# Patient Record
Sex: Male | Born: 1945 | Race: White | Hispanic: No | Marital: Married | Smoking: Never smoker
Health system: Southern US, Community
[De-identification: ages and names within clinical notes are randomized; demographics above are authoritative.]

## PROBLEM LIST (undated history)

## (undated) DIAGNOSIS — R112 Nausea with vomiting, unspecified: Secondary | ICD-10-CM

## (undated) DIAGNOSIS — Z9889 Other specified postprocedural states: Secondary | ICD-10-CM

## (undated) DIAGNOSIS — K831 Obstruction of bile duct: Secondary | ICD-10-CM

## (undated) DIAGNOSIS — F419 Anxiety disorder, unspecified: Secondary | ICD-10-CM

## (undated) DIAGNOSIS — M1990 Unspecified osteoarthritis, unspecified site: Secondary | ICD-10-CM

## (undated) DIAGNOSIS — K8689 Other specified diseases of pancreas: Secondary | ICD-10-CM

## (undated) DIAGNOSIS — C801 Malignant (primary) neoplasm, unspecified: Secondary | ICD-10-CM

## (undated) HISTORY — PX: TONSILLECTOMY: SUR1361

## (undated) HISTORY — PX: JOINT REPLACEMENT: SHX530

---

## 2012-05-12 ENCOUNTER — Encounter (HOSPITAL_COMMUNITY): Payer: Self-pay | Admitting: Pharmacy Technician

## 2012-05-15 NOTE — Progress Notes (Signed)
Need orders placed in EP[facility] for surgery on 05/25/12.  Preop appointment on 05/19/12 at 1000am.

## 2012-05-17 NOTE — H&P (Signed)
TOTAL KNEE ADMISSION H&P  Patient is being admitted for right total knee arthroplasty.  Subjective:  Chief Complaint:right knee pain.  HPI: Ryan Roach, 67 y.o. male, has a history of pain and functional disability in the right knee due to arthritis and has failed non-surgical conservative treatments for greater than 12 weeks to includeNSAID's and/or analgesics, weight reduction as appropriate and activity modification.  Onset of symptoms was gradual, starting >10 years ago with gradually worsening course since that time. The patient noted no past surgery on the right knee(s).  Patient currently rates pain in the right knee(s) at 7 out of 10 with activity. Patient has night pain, worsening of pain with activity and weight bearing, pain that interferes with activities of daily living, pain with passive range of motion, crepitus and joint swelling.  Patient has evidence of periarticular osteophytes, joint subluxation and joint space narrowing by imaging studies. There is no active infection.  Past Medical History Osteoarthritis Morbid Obesity  Past Surgical History  Tonsillectomy   Current outpatient prescriptions: ibuprofen (ADVIL,MOTRIN) 200 MG tablet, Take 400-600 mg by mouth every 6 (six) hours as needed for pain., Disp: , Rfl:   No Known Allergies   History  Substance Use Topics  . Smoking status: Nonsmoker  . Smokeless tobacco: Yes  . Alcohol Use: No   Family History Father deceased age 20 due to MVA Mother deceased age 32 due to breast cancer  Review of Systems  Constitutional: Negative.   HENT: Negative.  Negative for neck pain.   Eyes: Negative.   Respiratory: Negative.   Cardiovascular: Negative.   Gastrointestinal: Negative.   Genitourinary: Negative.   Musculoskeletal: Positive for joint pain. Negative for myalgias, back pain and falls.       Bilateral knee pain  Skin: Negative.   Neurological: Negative.   Endo/Heme/Allergies: Negative.    Psychiatric/Behavioral: Negative.     Objective:  Physical Exam  Constitutional: He is oriented to person, place, and time. He appears well-developed. No distress.  HENT:  Head: Normocephalic and atraumatic.  Right Ear: External ear normal.  Left Ear: External ear normal.  Nose: Nose normal.  Mouth/Throat: Oropharynx is clear and moist.  Eyes: Conjunctivae and EOM are normal.  Neck: Normal range of motion. Neck supple. No tracheal deviation present. No thyromegaly present.  Cardiovascular: Normal rate, normal heart sounds and intact distal pulses.   No murmur heard. Respiratory: Effort normal and breath sounds normal. No respiratory distress. He has no wheezes. He exhibits no tenderness.  GI: Soft. Bowel sounds are normal. He exhibits no distension and no mass. There is no tenderness.  Musculoskeletal:       Right hip: Normal.       Left hip: Normal.       Right knee: He exhibits decreased range of motion, swelling and abnormal alignment. He exhibits no effusion and no erythema. Tenderness found. Medial joint line and lateral joint line tenderness noted.       Left knee: He exhibits decreased range of motion, swelling and abnormal alignment. He exhibits no effusion and no erythema. Tenderness found. Medial joint line and lateral joint line tenderness noted.       Right lower leg: He exhibits no tenderness and no swelling.       Left lower leg: He exhibits no tenderness and no swelling.  5 to 90 degrees bilaterally   Lymphadenopathy:    He has no cervical adenopathy.  Neurological: He is alert and oriented to person, place, and time. He  has normal strength. No sensory deficit.  Skin: No rash noted. He is not diaphoretic. No erythema.  Psychiatric: He has a normal mood and affect. His behavior is normal.    Vitals Weight: 317 lb Height: 71 in Body Surface Area: 2.68 m Body Mass Index: 44.21 kg/m BP: 150/80 (Sitting, Left Arm, Standard)  HR: 84 bpm  Imaging  Review Plain radiographs demonstrate severe degenerative joint disease of the right knee(s). The overall alignment issignificant varus. The bone quality appears to be fair for age and reported activity level.  Assessment/Plan:  End stage arthritis, right knee   The patient history, physical examination, clinical judgment of the provider and imaging studies are consistent with end stage degenerative joint disease of the right knee(s) and total knee arthroplasty is deemed medically necessary. The treatment options including medical management, injection therapy arthroscopy and arthroplasty were discussed at length. The risks and benefits of total knee arthroplasty were presented and reviewed. The risks due to aseptic loosening, infection, stiffness, patella tracking problems, thromboembolic complications and other imponderables were discussed. The patient acknowledged the explanation, agreed to proceed with the plan and consent was signed. Patient is being admitted for inpatient treatment for surgery, pain control, PT, OT, prophylactic antibiotics, VTE prophylaxis, progressive ambulation and ADL's and discharge planning. The patient is planning to be discharged home with home health services     [place], [place]

## 2012-05-18 ENCOUNTER — Inpatient Hospital Stay (HOSPITAL_COMMUNITY): Admission: RE | Admit: 2012-05-18 | Payer: Self-pay | Source: Ambulatory Visit

## 2012-05-19 ENCOUNTER — Encounter (HOSPITAL_COMMUNITY): Payer: Self-pay

## 2012-05-19 ENCOUNTER — Encounter (HOSPITAL_COMMUNITY)
Admission: RE | Admit: 2012-05-19 | Discharge: 2012-05-19 | Disposition: A | Discharge status: Home / Self Care | Payer: Managed Care, Other (non HMO) | Source: Ambulatory Visit | Attending: Orthopedic Surgery | Admitting: Orthopedic Surgery

## 2012-05-19 ENCOUNTER — Ambulatory Visit (HOSPITAL_COMMUNITY)
Admission: RE | Admit: 2012-05-19 | Discharge: 2012-05-19 | Disposition: A | Discharge status: Home / Self Care | Payer: Managed Care, Other (non HMO) | Source: Ambulatory Visit | Attending: Surgical | Admitting: Surgical

## 2012-05-19 DIAGNOSIS — I517 Cardiomegaly: Secondary | ICD-10-CM | POA: Insufficient documentation

## 2012-05-19 DIAGNOSIS — Z01812 Encounter for preprocedural laboratory examination: Secondary | ICD-10-CM | POA: Insufficient documentation

## 2012-05-19 DIAGNOSIS — M1710 Unilateral primary osteoarthritis, unspecified knee: Secondary | ICD-10-CM | POA: Insufficient documentation

## 2012-05-19 DIAGNOSIS — Z01811 Encounter for preprocedural respiratory examination: Secondary | ICD-10-CM | POA: Insufficient documentation

## 2012-05-19 HISTORY — DX: Unspecified osteoarthritis, unspecified site: M19.90

## 2012-05-19 LAB — COMPREHENSIVE METABOLIC PANEL
ALT: 16 U/L (ref 0–53)
AST: 19 U/L (ref 0–37)
Albumin: 3.6 g/dL (ref 3.5–5.2)
Alkaline Phosphatase: 136 U/L — ABNORMAL HIGH (ref 39–117)
BUN: 21 mg/dL (ref 6–23)
CO2: 26 mEq/L (ref 19–32)
Calcium: 8.9 mg/dL (ref 8.4–10.5)
Chloride: 106 mEq/L (ref 96–112)
Creatinine, Ser: 0.59 mg/dL (ref 0.50–1.35)
GFR calc Af Amer: >90 mL/min (ref >90)
GFR calc non Af Amer: >90 mL/min (ref >90)
Glucose, Bld: 98 mg/dL (ref 70–99)
Potassium: 4 mEq/L (ref 3.5–5.1)
Sodium: 142 mEq/L (ref 135–145)
Total Bilirubin: 0.7 mg/dL (ref 0.3–1.2)
Total Protein: 7.4 g/dL (ref 6.0–8.3)

## 2012-05-19 LAB — CBC
MCH: 30.2 pg (ref 26.0–34.0)
MCV: 90.1 fL (ref 78.0–100.0)
Platelets: 286 10*3/uL (ref 150–400)
RDW: 13.6 % (ref 11.5–15.5)

## 2012-05-19 LAB — URINALYSIS, ROUTINE W REFLEX MICROSCOPIC
Glucose, UA: NEGATIVE mg/dL
Hgb urine dipstick: NEGATIVE
Ketones, ur: NEGATIVE mg/dL
Leukocytes, UA: NEGATIVE
Nitrite: NEGATIVE
Protein, ur: NEGATIVE mg/dL
Specific Gravity, Urine: 1.042 — ABNORMAL HIGH (ref 1.005–1.030)
Urobilinogen, UA: 1 mg/dL (ref 0.0–1.0)
pH: 5.5 (ref 5.0–8.0)

## 2012-05-19 LAB — PROTIME-INR
INR: 1 (ref 0.00–1.49)
Prothrombin Time: 13.1 seconds (ref 11.6–15.2)

## 2012-05-19 LAB — APTT: aPTT: 33 seconds (ref 24–37)

## 2012-05-19 NOTE — Progress Notes (Signed)
05/19/12 1017  OBSTRUCTIVE SLEEP APNEA  Have you ever been diagnosed with sleep apnea through a sleep study? No  Do you snore loudly (loud enough to be heard through closed doors)?  0  Do you often feel tired, fatigued, or sleepy during the daytime? 0  Has anyone observed you stop breathing during your sleep? 0  BMI more than 35 kg/m2? 1  Age over 66 years old? 1  Neck circumference greater than 40 cm/18 inches? 1  Gender: 1  Obstructive Sleep Apnea Score 4  Score 4 or greater  Results sent to PCP

## 2012-05-19 NOTE — Patient Instructions (Addendum)
Ryan Roach  05/19/2012                           YOUR PROCEDURE IS SCHEDULED ON: 05/25/12               PLEASE REPORT TO SHORT STAY CENTER AT :  12:00 PM               CALL THIS NUMBER IF ANY PROBLEMS THE DAY OF SURGERY :               [phone]                      REMEMBER:   Do not eat food or drink liquids AFTER MIDNIGHT  May have clear liquids UNTIL 6 HOURS BEFORE SURGERY (8:30 AM)  Clear liquids include soda, tea, black coffee, apple or grape juice, broth.  Take these medicines the morning of surgery with A SIP OF WATER:  TYLENOL IF NEEDED   Do not wear jewelry, make-up   Do not wear lotions, powders, or perfumes.   Do not shave legs or underarms 12 hrs. before surgery (men may shave face)  Do not bring valuables to the hospital.  Contacts, dentures or bridgework may not be worn into surgery.  Leave suitcase in the car. After surgery it may be brought to your room.  For patients admitted to the hospital more than one night, checkout time is 11:00                          The day of discharge.   Patients discharged the day of surgery will not be allowed to drive home                             If going home same day of surgery, must have someone stay with you first                           24 hrs at home and arrange for some one to drive you home from hospital.    Special Instructions:   Please read over the following fact sheets that you were given:               1. MRSA  INFORMATION                      2. [facility] PREPARING FOR SURGERY SHEET               3. INCENTIVE SPIROMETER                                                X_____________________________________________________________________        Failure to follow these instructions may result in cancellation of your surgery

## 2012-05-19 NOTE — Progress Notes (Signed)
Abnormal UA and CMET faxed to Dr. Darrelyn Hillock thru [facility]

## 2012-05-25 ENCOUNTER — Encounter (HOSPITAL_COMMUNITY): Payer: Self-pay | Admitting: *Deleted

## 2012-05-25 ENCOUNTER — Encounter (HOSPITAL_COMMUNITY)
Admission: RE | Disposition: A | Discharge status: Home Health | Payer: Self-pay | Source: Ambulatory Visit | Attending: Orthopedic Surgery

## 2012-05-25 ENCOUNTER — Encounter (HOSPITAL_COMMUNITY): Payer: Self-pay | Admitting: Anesthesiology

## 2012-05-25 ENCOUNTER — Inpatient Hospital Stay (HOSPITAL_COMMUNITY)
Admission: RE | Admit: 2012-05-25 | Discharge: 2012-05-28 | DRG: 470 | Disposition: A | Discharge status: Home Health | Payer: Managed Care, Other (non HMO) | Source: Ambulatory Visit | Attending: Orthopedic Surgery | Admitting: Orthopedic Surgery

## 2012-05-25 ENCOUNTER — Inpatient Hospital Stay (HOSPITAL_COMMUNITY): Payer: Managed Care, Other (non HMO)

## 2012-05-25 ENCOUNTER — Inpatient Hospital Stay (HOSPITAL_COMMUNITY): Payer: Managed Care, Other (non HMO) | Admitting: Anesthesiology

## 2012-05-25 DIAGNOSIS — M24569 Contracture, unspecified knee: Secondary | ICD-10-CM | POA: Diagnosis present

## 2012-05-25 DIAGNOSIS — M1710 Unilateral primary osteoarthritis, unspecified knee: Principal | ICD-10-CM | POA: Diagnosis present

## 2012-05-25 DIAGNOSIS — Z6841 Body Mass Index (BMI) 40.0 and over, adult: Secondary | ICD-10-CM

## 2012-05-25 DIAGNOSIS — M1711 Unilateral primary osteoarthritis, right knee: Secondary | ICD-10-CM | POA: Diagnosis present

## 2012-05-25 DIAGNOSIS — Z96651 Presence of right artificial knee joint: Secondary | ICD-10-CM

## 2012-05-25 DIAGNOSIS — D62 Acute posthemorrhagic anemia: Secondary | ICD-10-CM | POA: Diagnosis not present

## 2012-05-25 HISTORY — PX: TOTAL KNEE ARTHROPLASTY: SHX125

## 2012-05-25 LAB — TYPE AND SCREEN
ABO/RH(D): A NEG
Antibody Screen: NEGATIVE

## 2012-05-25 SURGERY — ARTHROPLASTY, KNEE, TOTAL
Anesthesia: General | Site: Knee | Laterality: Right | Wound class: Clean

## 2012-05-25 MED ORDER — SODIUM CHLORIDE 0.9 % IR SOLN
Status: DC | PRN
  Administered 2012-05-25: 1000 mL

## 2012-05-25 MED ORDER — METHOCARBAMOL 500 MG PO TABS
500.0000 mg | ORAL_TABLET | Freq: Four times a day (QID) | ORAL | Status: DC | PRN
  Administered 2012-05-25: 500 mg via ORAL
  Filled 2012-05-25: qty 1

## 2012-05-25 MED ORDER — ACETAMINOPHEN 325 MG PO TABS: 650.0000 mg | ORAL_TABLET | Freq: Four times a day (QID) | ORAL | Status: DC | PRN

## 2012-05-25 MED ORDER — ONDANSETRON HCL 4 MG/2ML IJ SOLN
INTRAMUSCULAR | Status: DC | PRN
  Administered 2012-05-25: 4 mg via INTRAVENOUS

## 2012-05-25 MED ORDER — PROPOFOL 10 MG/ML IV BOLUS
INTRAVENOUS | Status: DC | PRN
  Administered 2012-05-25: 250 mg via INTRAVENOUS

## 2012-05-25 MED ORDER — LACTATED RINGERS IV BOLUS (SEPSIS)
250.0000 mL | INTRAVENOUS | Status: AC
  Administered 2012-05-25: 18:00:00 via INTRAVENOUS
  Administered 2012-05-25: 250 mL via INTRAVENOUS

## 2012-05-25 MED ORDER — POLYETHYLENE GLYCOL 3350 17 G PO PACK
17.0000 g | PACK | Freq: Every day | ORAL | Status: DC | PRN
  Filled 2012-05-25: qty 1

## 2012-05-25 MED ORDER — HYDROMORPHONE HCL PF 1 MG/ML IJ SOLN: 0.2500 mg | INTRAMUSCULAR | Status: DC | PRN

## 2012-05-25 MED ORDER — DEXTROSE 5 % IV SOLN
3.0000 g | INTRAVENOUS | Status: AC
  Administered 2012-05-25: 3 g via INTRAVENOUS
  Filled 2012-05-25: qty 3000

## 2012-05-25 MED ORDER — HYDROMORPHONE HCL PF 1 MG/ML IJ SOLN
INTRAMUSCULAR | Status: DC | PRN
  Administered 2012-05-25: .4 mg via INTRAVENOUS
  Administered 2012-05-25: 1 mg via INTRAVENOUS
  Administered 2012-05-25: .4 mg via INTRAVENOUS
  Administered 2012-05-25: .8 mg via INTRAVENOUS
  Administered 2012-05-25: 1 mg via INTRAVENOUS
  Administered 2012-05-25: .4 mg via INTRAVENOUS

## 2012-05-25 MED ORDER — FLEET ENEMA 7-19 GM/118ML RE ENEM: 1.0000 | ENEMA | Freq: Once | RECTAL | Status: AC | PRN

## 2012-05-25 MED ORDER — CELECOXIB 200 MG PO CAPS
200.0000 mg | ORAL_CAPSULE | Freq: Two times a day (BID) | ORAL | Status: DC
  Administered 2012-05-25 – 2012-05-28 (×6): 200 mg via ORAL
  Filled 2012-05-25 (×8): qty 1

## 2012-05-25 MED ORDER — LACTATED RINGERS IV SOLN
INTRAVENOUS | Status: DC
Start: 2012-05-25 — End: 2012-05-28
  Administered 2012-05-25 – 2012-05-27 (×6): via INTRAVENOUS

## 2012-05-25 MED ORDER — MIDAZOLAM HCL 5 MG/5ML IJ SOLN
INTRAMUSCULAR | Status: DC | PRN
  Administered 2012-05-25: 2 mg via INTRAVENOUS

## 2012-05-25 MED ORDER — LIDOCAINE HCL (CARDIAC) 20 MG/ML IV SOLN
INTRAVENOUS | Status: DC | PRN
  Administered 2012-05-25: 80 mg via INTRAVENOUS

## 2012-05-25 MED ORDER — CEFAZOLIN SODIUM 1-5 GM-% IV SOLN
1.0000 g | Freq: Four times a day (QID) | INTRAVENOUS | Status: AC
  Administered 2012-05-25 – 2012-05-26 (×2): 1 g via INTRAVENOUS
  Filled 2012-05-25 (×3): qty 50

## 2012-05-25 MED ORDER — MENTHOL 3 MG MT LOZG
1.0000 | LOZENGE | OROMUCOSAL | Status: DC | PRN
  Filled 2012-05-25: qty 9

## 2012-05-25 MED ORDER — PHENOL 1.4 % MT LIQD: 1.0000 | OROMUCOSAL | Status: DC | PRN

## 2012-05-25 MED ORDER — SUCCINYLCHOLINE CHLORIDE 20 MG/ML IJ SOLN
INTRAMUSCULAR | Status: DC | PRN
  Administered 2012-05-25: 160 mg via INTRAVENOUS

## 2012-05-25 MED ORDER — ALUM & MAG HYDROXIDE-SIMETH 200-200-20 MG/5ML PO SUSP: 30.0000 mL | ORAL | Status: DC | PRN

## 2012-05-25 MED ORDER — METHOCARBAMOL 500 MG PO TABS: 500.0000 mg | ORAL_TABLET | Freq: Four times a day (QID) | ORAL | Status: DC | PRN

## 2012-05-25 MED ORDER — OXYCODONE-ACETAMINOPHEN 5-325 MG PO TABS: 1.0000 | ORAL_TABLET | ORAL | Status: DC | PRN

## 2012-05-25 MED ORDER — OXYCODONE-ACETAMINOPHEN 5-325 MG PO TABS
2.0000 | ORAL_TABLET | ORAL | Status: DC | PRN
  Administered 2012-05-25 – 2012-05-26 (×5): 2 via ORAL
  Filled 2012-05-25: qty 2
  Filled 2012-05-25: qty 1
  Filled 2012-05-25 (×5): qty 2

## 2012-05-25 MED ORDER — FERROUS SULFATE 325 (65 FE) MG PO TABS
325.0000 mg | ORAL_TABLET | Freq: Three times a day (TID) | ORAL | Status: DC
  Administered 2012-05-26 – 2012-05-28 (×7): 325 mg via ORAL
  Filled 2012-05-25 (×10): qty 1

## 2012-05-25 MED ORDER — ROCURONIUM BROMIDE 100 MG/10ML IV SOLN
INTRAVENOUS | Status: DC | PRN
  Administered 2012-05-25: 10 mg via INTRAVENOUS
  Administered 2012-05-25 (×2): 20 mg via INTRAVENOUS

## 2012-05-25 MED ORDER — THROMBIN 5000 UNITS EX SOLR
CUTANEOUS | Status: DC | PRN
  Administered 2012-05-25: 5000 [IU] via TOPICAL

## 2012-05-25 MED ORDER — RIVAROXABAN 10 MG PO TABS: 10.0000 mg | ORAL_TABLET | Freq: Every day | ORAL | Status: DC

## 2012-05-25 MED ORDER — BUPIVACAINE LIPOSOME 1.3 % IJ SUSP
20.0000 mL | Freq: Once | INTRAMUSCULAR | Status: DC
  Filled 2012-05-25: qty 20

## 2012-05-25 MED ORDER — HYDROMORPHONE HCL PF 1 MG/ML IJ SOLN: 1.0000 mg | INTRAMUSCULAR | Status: DC | PRN

## 2012-05-25 MED ORDER — RIVAROXABAN 10 MG PO TABS
10.0000 mg | ORAL_TABLET | Freq: Every day | ORAL | Status: DC
  Administered 2012-05-26 – 2012-05-28 (×3): 10 mg via ORAL
  Filled 2012-05-25 (×4): qty 1

## 2012-05-25 MED ORDER — BISACODYL 10 MG RE SUPP: 10.0000 mg | Freq: Every day | RECTAL | Status: DC | PRN

## 2012-05-25 MED ORDER — LABETALOL HCL 5 MG/ML IV SOLN
2.5000 mg | Freq: Once | INTRAVENOUS | Status: AC
  Administered 2012-05-25: 2.5 mg via INTRAVENOUS

## 2012-05-25 MED ORDER — FENTANYL CITRATE 0.05 MG/ML IJ SOLN
INTRAMUSCULAR | Status: DC | PRN
  Administered 2012-05-25: 50 ug via INTRAVENOUS
  Administered 2012-05-25: 100 ug via INTRAVENOUS
  Administered 2012-05-25 (×2): 50 ug via INTRAVENOUS
  Administered 2012-05-25: 100 ug via INTRAVENOUS

## 2012-05-25 MED ORDER — CHLORHEXIDINE GLUCONATE 4 % EX LIQD: 60.0000 mL | Freq: Once | CUTANEOUS | Status: DC

## 2012-05-25 MED ORDER — POLYETHYLENE GLYCOL 3350 17 G PO PACK: 17.0000 g | PACK | Freq: Every day | ORAL | Status: DC | PRN

## 2012-05-25 MED ORDER — GLYCOPYRROLATE 0.2 MG/ML IJ SOLN
INTRAMUSCULAR | Status: DC | PRN
  Administered 2012-05-25: 0.4 mg via INTRAVENOUS

## 2012-05-25 MED ORDER — SODIUM CHLORIDE 0.9 % IJ SOLN
INTRAMUSCULAR | Status: DC | PRN
  Administered 2012-05-25 (×2)

## 2012-05-25 MED ORDER — LACTATED RINGERS IV SOLN
INTRAVENOUS | Status: DC
  Administered 2012-05-25: 18:00:00 via INTRAVENOUS
  Administered 2012-05-25: 1000 mL via INTRAVENOUS

## 2012-05-25 MED ORDER — PROMETHAZINE HCL 25 MG/ML IJ SOLN
6.2500 mg | INTRAMUSCULAR | Status: DC | PRN
  Administered 2012-05-25: 6.25 mg via INTRAVENOUS

## 2012-05-25 MED ORDER — ACETAMINOPHEN 650 MG RE SUPP: 650.0000 mg | Freq: Four times a day (QID) | RECTAL | Status: DC | PRN

## 2012-05-25 MED ORDER — SODIUM CHLORIDE 0.9 % IR SOLN
Status: DC | PRN
  Administered 2012-05-25: 14:00:00

## 2012-05-25 MED ORDER — ONDANSETRON HCL 4 MG/2ML IJ SOLN: 4.0000 mg | Freq: Four times a day (QID) | INTRAMUSCULAR | Status: DC | PRN

## 2012-05-25 MED ORDER — ONDANSETRON HCL 4 MG PO TABS: 4.0000 mg | ORAL_TABLET | Freq: Four times a day (QID) | ORAL | Status: DC | PRN

## 2012-05-25 MED ORDER — ACETAMINOPHEN 10 MG/ML IV SOLN
INTRAVENOUS | Status: DC | PRN
  Administered 2012-05-25: 1000 mg via INTRAVENOUS

## 2012-05-25 MED ORDER — FERROUS SULFATE 325 (65 FE) MG PO TABS: 325.0000 mg | ORAL_TABLET | Freq: Three times a day (TID) | ORAL | Status: DC

## 2012-05-25 MED ORDER — DEXTROSE 5 % IV SOLN: 500.0000 mg | Freq: Four times a day (QID) | INTRAVENOUS | Status: DC | PRN

## 2012-05-25 MED ORDER — LACTATED RINGERS IV SOLN
INTRAVENOUS | Status: DC | PRN
  Administered 2012-05-25 (×2): via INTRAVENOUS

## 2012-05-25 MED ORDER — HYDROCODONE-ACETAMINOPHEN 5-325 MG PO TABS
1.0000 | ORAL_TABLET | ORAL | Status: DC | PRN
  Administered 2012-05-26 – 2012-05-28 (×7): 2 via ORAL
  Filled 2012-05-25 (×7): qty 2

## 2012-05-25 MED ORDER — NEOSTIGMINE METHYLSULFATE 1 MG/ML IJ SOLN
INTRAMUSCULAR | Status: DC | PRN
  Administered 2012-05-25: 2.5 mg via INTRAVENOUS

## 2012-05-25 MED ORDER — SODIUM CHLORIDE 0.9 % IJ SOLN
INTRAMUSCULAR | Status: DC | PRN
  Administered 2012-05-25: 50 mL

## 2012-05-25 SURGICAL SUPPLY — 64 items
BAG ZIPLOCK 12X15 (MISCELLANEOUS) ×2 IMPLANT
BANDAGE ELASTIC 4 VELCRO ST LF (GAUZE/BANDAGES/DRESSINGS) ×2 IMPLANT
BANDAGE ELASTIC 6 VELCRO ST LF (GAUZE/BANDAGES/DRESSINGS) ×2 IMPLANT
BANDAGE ESMARK 6X9 LF (GAUZE/BANDAGES/DRESSINGS) ×1 IMPLANT
BANDAGE GAUZE ELAST BULKY 4 IN (GAUZE/BANDAGES/DRESSINGS) ×2 IMPLANT
BLADE SAG 18X100X1.27 (BLADE) ×2 IMPLANT
BLADE SAW SGTL 11.0X1.19X90.0M (BLADE) ×2 IMPLANT
BNDG ESMARK 6X9 LF (GAUZE/BANDAGES/DRESSINGS) ×2
BONE CEMENT GENTAMICIN (Cement) ×4 IMPLANT
CEMENT BONE GENTAMICIN 40 (Cement) ×2 IMPLANT
CLOTH BEACON ORANGE TIMEOUT ST (SAFETY) ×2 IMPLANT
CLSR STERI-STRIP ANTIMIC 1/2X4 (GAUZE/BANDAGES/DRESSINGS) ×4 IMPLANT
CUFF TOURN SGL QUICK 34 (TOURNIQUET CUFF) ×1
CUFF TRNQT CYL 34X4X40X1 (TOURNIQUET CUFF) ×1 IMPLANT
DRAPE EXTREMITY T 121X128X90 (DRAPE) ×2 IMPLANT
DRAPE INCISE IOBAN 66X45 STRL (DRAPES) ×2 IMPLANT
DRAPE LG THREE QUARTER DISP (DRAPES) ×2 IMPLANT
DRAPE POUCH INSTRU U-SHP 10X18 (DRAPES) ×2 IMPLANT
DRAPE U-SHAPE 47X51 STRL (DRAPES) ×2 IMPLANT
DRSG ADAPTIC 3X8 NADH LF (GAUZE/BANDAGES/DRESSINGS) ×2 IMPLANT
DRSG PAD ABDOMINAL 8X10 ST (GAUZE/BANDAGES/DRESSINGS) ×6 IMPLANT
DURAPREP 26ML APPLICATOR (WOUND CARE) ×2 IMPLANT
ELECT BLADE TIP CTD 4 INCH (ELECTRODE) ×4 IMPLANT
ELECT REM PT RETURN 9FT ADLT (ELECTROSURGICAL) ×2
ELECTRODE REM PT RTRN 9FT ADLT (ELECTROSURGICAL) ×1 IMPLANT
EVACUATOR 1/8 PVC DRAIN (DRAIN) ×2 IMPLANT
FACESHIELD LNG OPTICON STERILE (SAFETY) ×12 IMPLANT
GLOVE BIOGEL PI IND STRL 8 (GLOVE) ×1 IMPLANT
GLOVE BIOGEL PI IND STRL 8.5 (GLOVE) IMPLANT
GLOVE BIOGEL PI INDICATOR 8 (GLOVE) ×1
GLOVE BIOGEL PI INDICATOR 8.5 (GLOVE)
GLOVE ECLIPSE 8.0 STRL XLNG CF (GLOVE) ×4 IMPLANT
GLOVE SURG SS PI 6.5 STRL IVOR (GLOVE) ×4 IMPLANT
GOWN PREVENTION PLUS LG XLONG (DISPOSABLE) ×4 IMPLANT
GOWN STRL REIN XL XLG (GOWN DISPOSABLE) ×2 IMPLANT
HANDPIECE INTERPULSE COAX TIP (DISPOSABLE) ×1
IMMOBILIZER KNEE 20 (SOFTGOODS) ×2
IMMOBILIZER KNEE 20 THIGH 36 (SOFTGOODS) ×1 IMPLANT
KIT BASIN OR (CUSTOM PROCEDURE TRAY) ×2 IMPLANT
MANIFOLD NEPTUNE II (INSTRUMENTS) ×2 IMPLANT
NEEDLE HYPO 22GX1.5 SAFETY (NEEDLE) ×2 IMPLANT
NS IRRIG 1000ML POUR BTL (IV SOLUTION) ×2 IMPLANT
PACK TOTAL JOINT (CUSTOM PROCEDURE TRAY) ×2 IMPLANT
POSITIONER SURGICAL ARM (MISCELLANEOUS) ×2 IMPLANT
SET HNDPC FAN SPRY TIP SCT (DISPOSABLE) ×1 IMPLANT
SET PAD KNEE POSITIONER (MISCELLANEOUS) ×2 IMPLANT
SPONGE GAUZE 4X4 12PLY (GAUZE/BANDAGES/DRESSINGS) ×2 IMPLANT
SPONGE LAP 18X18 X RAY DECT (DISPOSABLE) ×2 IMPLANT
SPONGE SURGIFOAM ABS GEL 100 (HEMOSTASIS) ×2 IMPLANT
STAPLER VISISTAT 35W (STAPLE) IMPLANT
SUCTION FRAZIER 12FR DISP (SUCTIONS) ×2 IMPLANT
SUT BONE WAX W31G (SUTURE) ×2 IMPLANT
SUT MNCRL AB 4-0 PS2 18 (SUTURE) ×2 IMPLANT
SUT VIC AB 1 CT1 27 (SUTURE) ×2
SUT VIC AB 1 CT1 27XBRD ANTBC (SUTURE) ×2 IMPLANT
SUT VIC AB 2-0 CT1 27 (SUTURE) ×2
SUT VIC AB 2-0 CT1 TAPERPNT 27 (SUTURE) ×2 IMPLANT
SUT VLOC 180 0 24IN GS25 (SUTURE) ×2 IMPLANT
SYR 20CC LL (SYRINGE) ×2 IMPLANT
TOWEL OR 17X26 10 PK STRL BLUE (TOWEL DISPOSABLE) ×4 IMPLANT
TOWER CARTRIDGE SMART MIX (DISPOSABLE) ×2 IMPLANT
TRAY FOLEY CATH 14FRSI W/METER (CATHETERS) ×2 IMPLANT
WATER STERILE IRR 1500ML POUR (IV SOLUTION) ×2 IMPLANT
WRAP KNEE MAXI GEL POST OP (GAUZE/BANDAGES/DRESSINGS) ×4 IMPLANT

## 2012-05-25 NOTE — Anesthesia Postprocedure Evaluation (Addendum)
  Anesthesia Post-op Note  Patient: Ryan Roach  Procedure(s) Performed: Procedure(s) (LRB): RIGHT TOTAL KNEE ARTHROPLASTY (Right)  Patient Location: PACU  Anesthesia Type: General  Level of Consciousness: awake and alert   Airway and Oxygen Therapy: Patient Spontanous Breathing  Post-op Pain: mild  Post-op Assessment: Post-op Vital signs reviewed, Patient's Cardiovascular Status Stable, Respiratory Function Stable, Patent Airway and No signs of Nausea or vomiting  Last Vitals:  Filed Vitals:   05/25/12 1800  BP: 146/82  Pulse: 117  Temp:   Resp: 16    Post-op Vital Signs: stable   Complications: No apparent anesthesia complications. Pretty sedated after two hours in PACU. Plan to transfer to stepdown unit for increased nursing observation in this patient at high risk for sleep apnea. He is tolerating a nasal airway without complaint. Plan IV fluids for increased HR. He has only received 2000 cc crystalloid today. Urine output borderline (130 cc over two hours).

## 2012-05-25 NOTE — Brief Op Note (Signed)
05/25/2012  3:27 PM  PATIENT:  Ryan Roach  67 y.o. male  PRE-OPERATIVE DIAGNOSIS:  Right Knee Osteoarthritis.VERY COMPLEX; Morbid Obesity  POST-OPERATIVE DIAGNOSIS:  Right Knee Osteoarthritis;VERY COMPLEX and Morbid Obesity  PROCEDURE:  Procedure(s): RIGHT TOTAL KNEE ARTHROPLASTY (Right),COMPLEX  SURGEON:  Surgeon(s) and Role:    * Jacki Cones, MD - Primary  PHYSICIAN ASSISTANT: Dimitri Ped PA:   ASSISTANTS: Dimitri Ped PA  ANESTHESIA:   general  EBL:  Total I/O In: 1000 [I.V.:1000] Out: 300 [Urine:300]  BLOOD ADMINISTERED:none  DRAINS: (One) Hemovact drain(s) in the Right Knee. with  Suction Open   LOCAL MEDICATIONS USED:  BUPIVICAINE 20cc mixed with 20cc of Normal Saline.  SPECIMEN:  No Specimen  DISPOSITION OF SPECIMEN:  N/A  COUNTS:  YES  TOURNIQUET:  * Missing tourniquet times found for documented tourniquets in log:  92270 *  DICTATION: .Other Dictation: Dictation Number [id number]  PLAN OF CARE: Admit to inpatient   PATIENT DISPOSITION:  Stable in OR.   Delay start of Pharmacological VTE agent (>24hrs) due to surgical blood loss or risk of bleeding: yes

## 2012-05-25 NOTE — Anesthesia Procedure Notes (Addendum)
Performed by: Thornell Mule   Procedure Name: Intubation Date/Time: 05/25/2012 1:37 PM Performed by: Thornell Mule Pre-anesthesia Checklist: Patient identified, Timeout performed, Suction available, Emergency Drugs available and Patient being monitored Patient Re-evaluated:Patient Re-evaluated prior to inductionOxygen Delivery Method: Circle system utilized Preoxygenation: Pre-oxygenation with 100% oxygen Intubation Type: IV induction Ventilation: Mask ventilation without difficulty Laryngoscope Size: Mac and 4 Grade View: Grade III Tube type: Oral Tube size: 7.5 mm Number of attempts: 2 Airway Equipment and Method: Stylet and Oral airway Placement Confirmation: ETT inserted through vocal cords under direct vision,  positive ETCO2 and breath sounds checked- equal and bilateral Secured at: 23 cm Tube secured with: Tape (no oral trauma during ett placement)

## 2012-05-25 NOTE — Interval H&P Note (Signed)
History and Physical Interval Note:  05/25/2012 1:09 PM  Ryan Roach  has presented today for surgery, with the diagnosis of Right Knee Osteoarthritis  The various methods of treatment have been discussed with the patient and family. After consideration of risks, benefits and other options for treatment, the patient has consented to  Procedure(s): RIGHT TOTAL KNEE ARTHROPLASTY (Right) as a surgical intervention .  The patient's history has been reviewed, patient examined, no change in status, stable for surgery.  I have reviewed the patient's chart and labs.  Questions were answered to the patient's satisfaction.     Arron Tetrault A

## 2012-05-25 NOTE — Preoperative (Signed)
Beta Blockers   Reason not to administer Beta Blockers:Not Applicable 

## 2012-05-25 NOTE — Anesthesia Preprocedure Evaluation (Addendum)
Anesthesia Evaluation  Patient identified by MRN, date of birth, ID band Patient awake    Reviewed: Allergy & Precautions, H&P , NPO status , Patient's Chart, lab work & pertinent test results  Airway Mallampati: II TM Distance: >3 FB Neck ROM: Full    Dental no notable dental hx.    Pulmonary neg pulmonary ROS,  breath sounds clear to auscultation  Pulmonary exam normal       Cardiovascular Exercise Tolerance: Good Rhythm:Regular Rate:Normal  CXR: cardiomegaly without acute disease.  ECG:NSR.  iRBBB   Neuro/Psych negative neurological ROS  negative psych ROS   GI/Hepatic negative GI ROS, Neg liver ROS,   Endo/Other  Morbid obesity  Renal/GU negative Renal ROS  negative genitourinary   Musculoskeletal negative musculoskeletal ROS (+)   Abdominal (+) + obese,   Peds negative pediatric ROS (+)  Hematology negative hematology ROS (+)   Anesthesia Other Findings   Reproductive/Obstetrics negative OB ROS                           Anesthesia Physical Anesthesia Plan  ASA: III  Anesthesia Plan: General   Post-op Pain Management:    Induction: Intravenous  Airway Management Planned: Oral ETT  Additional Equipment:   Intra-op Plan:   Post-operative Plan: Extubation in OR  Informed Consent: I have reviewed the patients History and Physical, chart, labs and discussed the procedure including the risks, benefits and alternatives for the proposed anesthesia with the patient or authorized representative who has indicated his/her understanding and acceptance.   Dental advisory given  Plan Discussed with: CRNA  Anesthesia Plan Comments: (Discussed general versus spinal. Patient prefers general.)       Anesthesia Quick Evaluation

## 2012-05-25 NOTE — Transfer of Care (Signed)
Immediate Anesthesia Transfer of Care Note  Patient: Ryan Roach  Procedure(s) Performed: Procedure(s): RIGHT TOTAL KNEE ARTHROPLASTY (Right)  Patient Location: PACU  Anesthesia Type:General  Level of Consciousness: awake, alert , oriented and patient cooperative  Airway & Oxygen Therapy: Patient Spontanous Breathing and Patient connected to face mask oxygen  Post-op Assessment: Report given to PACU RN, Post -op Vital signs reviewed and stable and Patient moving all extremities X 4  Post vital signs: Reviewed and stable  Complications: No apparent anesthesia complications

## 2012-05-26 ENCOUNTER — Encounter (HOSPITAL_COMMUNITY): Payer: Self-pay | Admitting: Orthopedic Surgery

## 2012-05-26 LAB — BASIC METABOLIC PANEL
GFR calc Af Amer: >90 mL/min (ref >90)
GFR calc non Af Amer: >90 mL/min (ref >90)
Potassium: 4 mEq/L (ref 3.5–5.1)
Sodium: 136 mEq/L (ref 135–145)

## 2012-05-26 LAB — CBC
Hemoglobin: 11.8 g/dL — ABNORMAL LOW (ref 13.0–17.0)
MCHC: 32.8 g/dL (ref 30.0–36.0)
Platelets: 304 10*3/uL (ref 150–400)
RDW: 13.5 % (ref 11.5–15.5)

## 2012-05-26 NOTE — Progress Notes (Signed)
UR COMPLETED  

## 2012-05-26 NOTE — Op Note (Signed)
Ryan Roach, Ryan Roach NO.:  [id number]  MEDICAL RECORD NO.:  [id number]  LOCATION:  1225                         FACILITY:  [facility]  PHYSICIAN:  Georges Lynch. Inari Shin, M.D.DATE OF BIRTH:  [birth year]  DATE OF PROCEDURE: DATE OF DISCHARGE:                              OPERATIVE REPORT   SURGEON:  Talley Kreiser A. Darrelyn Hillock, M.D.  OPERATIVE ASSISTANT:  Dimitri Ped, PA.  PREOPERATIVE DIAGNOSES: 1. Morbid obesity, weighs 370 pounds. 2. Severe bone-on-bone degenerative arthritis of the right knee with     severe contractures.  POSTOPERATIVE DIAGNOSES: 1. Morbid obesity, weighs 370 pounds. 2. Severe bone-on-bone degenerative arthritis of the right knee with     severe contractures.  OPERATION:  Right total knee arthroplasty utilizing DePuy system.  The sizes used were as follows; the size 5 right femoral component posterior cruciate sacrificing, tibial tray was a size 5, the insert was a 15-mm thickness size 5 rotating platform, patella was a size 41 with 3 pegs. All 3 components were cemented and gentamicin was used in the cement.  PROCEDURE:  Under general anesthesia, routine orthopedic prep and draping of the right lower extremity was carried out.  Appropriate effort time-out was carried out first.  I also marked the appropriate right leg in the holding area.  At this time, the leg was exsanguinated and Esmarch tourniquet was elevated to 350 mmHg.  Following that, the leg was placed in a DeMayo knee holder and the knee was flexed. Incision was made over the anterior aspect of the left knee.  Prior to doing this, he had 3 g of IV Ancef.  At this time, I then carried out to create 2 flaps, carried out a median parapatellar incision.  I reflected the patella laterally.  I then did medial lateral meniscectomies and excised the anterior and posterior cruciate ligaments.  I removed an extremely large with loose body in the suprapatellar pouch.  I removed most of the spurs  initially.  At this time, I then made my initial drill hole in the intercondylar notch.  I removed 12 mm thickness off the distal femur because of his contracture.  At this time, I then measured the femur to be a size 5, we cut the femur for anterior, posterior and chamfer cuts for a size 5 right femur.  Following this, we cut the tibia in the usual fashion utilizing the intra-articular guide.  Following that, we then removed 6 mm thickness off the affected medial side.  At this point, we then continued to do our soft tissue releases as well. We then utilized went back removed all the spurs the posterior femoral condyles.  I then utilized a spacer blocks and we measured the actual space to be about 15 mm thickness.  Following that, I then cut my tibia in usual fashion for the keel cut for a size 5 tray.  We then cut our notch cut out of the distal femur in usual fashion.  We then thoroughly irrigated out the knee and inserted our trial components.  We first tried a 12.5 mm thickness and finally went to 15 mm thickness.  We then did a resurfacing procedure on the patella  with the trial components in place.  I then made 3 drill holes in the articular surface of the patella and at this point, we then removed all trial components, thoroughly water picked out the knee.  We then cemented all 3 components in simultaneously and gentamicin was used in the cement.  Once the cement was dried, hardened we then removed all loose pieces of cement, water picked the knee out again to make sure there are no cement particles posteriorly.  At this time, I then injected a mixture of 20 mL of Exparel with 20 mL of normal saline.  I used half of that mixture initially and then I inserted some thrombin-soaked Gelfoam posteriorly.  I then reduced the knee after the permanent rotating platform size 5, 50 mm thickness was inserted.  The knee was reduced, the Hemovac drain was inserted, and then I closed the knee  in layers in usual fashion.          ______________________________ Georges Lynch Darrelyn Hillock, M.D.     RAG/MEDQ  D:  05/25/2012  T:  05/26/2012  Job:  [id number]

## 2012-05-26 NOTE — Evaluation (Signed)
Physical Therapy Evaluation Patient Details Name: Ryan Roach MRN: [id number] DOB: 17-Mar-1945 Today's Date: 05/26/2012 Time: 7829-5621 PT Time Calculation (min): 31 min  PT Assessment / Plan / Recommendation Clinical Impression  Pt presents s/p R TKA POD 1 with decreased strength, ROM and mobility.  Tolerated OOB and approx 5' amb, however is limited due to fatigue and pain.  Also, requires +2 assist at this time for safety and chair follow.  Pt will benefit from skilled PT in acute venue to address deficits.  PT recommends HHPT for follow up at D/C to maximize pts safety and function.     PT Assessment  Patient needs continued PT services    Follow Up Recommendations  Home health PT;Supervision for mobility/OOB    Does the patient have the potential to tolerate intense rehabilitation      Barriers to Discharge None      Equipment Recommendations  Rolling walker with 5" wheels (bari RW)    Recommendations for Other Services     Frequency 7X/week    Precautions / Restrictions Precautions Precautions: Knee;Fall Required Braces or Orthoses: Knee Immobilizer - Right Knee Immobilizer - Right: Discontinue once straight leg raise with < 10 degree lag Restrictions Weight Bearing Restrictions: No Other Position/Activity Restrictions: WBAT   Pertinent Vitals/Pain 5/10 pain, pt was premedicated.       Mobility  Bed Mobility Bed Mobility: Supine to Sit Supine to Sit: 4: Min assist;HOB elevated;With rails (cues not to hold breath) Details for Bed Mobility Assistance: Assist for RLE out of bed with cues for hand placement on bed to self assist trunk.  cues for continued breathing throughout mobility.  Transfers Transfers: Sit to Stand;Stand to Sit Sit to Stand: 1: +2 Total assist;From bed;From elevated surface Sit to Stand: Patient Percentage: 70% Stand to Sit: 1: +2 Total assist;With upper extremity assist;With armrests;To chair/3-in-1 Stand to Sit: Patient Percentage:  60% Details for Transfer Assistance: Cues for hand placement and LE management; difficulty controlling descent when sitting with max cues for safety and extending RLE in front of him when sitting.  Ambulation/Gait Ambulation/Gait Assistance: 1: +2 Total assist Ambulation/Gait: Patient Percentage: 60% Ambulation Distance (Feet): 5 Feet Assistive device: Rolling walker Ambulation/Gait Assistance Details: MAX cues for sequencing/technique with RW, maintaining upright posture throughout, and continued breathing.  Pt very limited by fatigue and pain this session.  Also noted that HR was up to 143 during mobility. Gait Pattern: Step-to pattern;Decreased stride length;Antalgic;Trunk flexed Gait velocity: decreased Stairs: No Wheelchair Mobility Wheelchair Mobility: No    Exercises     PT Diagnosis: Difficulty walking;Generalized weakness;Acute pain  PT Problem List: Decreased strength;Decreased range of motion;Decreased activity tolerance;Decreased balance;Decreased mobility;Decreased coordination;Obesity;Decreased knowledge of use of DME;Decreased safety awareness;Decreased knowledge of precautions;Pain PT Treatment Interventions: DME instruction;Gait training;Stair training;Functional mobility training;Therapeutic activities;Therapeutic exercise;Balance training;Patient/family education   PT Goals Acute Rehab PT Goals PT Goal Formulation: With patient Time For Goal Achievement: 06/02/12 Potential to Achieve Goals: Good Pt will go Supine/Side to Sit: with supervision PT Goal: Supine/Side to Sit - Progress: Goal set today Pt will go Sit to Supine/Side: with supervision PT Goal: Sit to Supine/Side - Progress: Goal set today Pt will go Sit to Stand: with supervision PT Goal: Sit to Stand - Progress: Goal set today Pt will go Stand to Sit: with supervision PT Goal: Stand to Sit - Progress: Goal set today Pt will Ambulate: 51 - 150 feet;with supervision;with least restrictive assistive  device PT Goal: Ambulate - Progress: Goal set today Pt will Go  Up / Down Stairs: 1-2 stairs;with min assist;with least restrictive assistive device PT Goal: Up/Down Stairs - Progress: Goal set today Pt will Perform Home Exercise Program: with supervision, verbal cues required/provided PT Goal: Perform Home Exercise Program - Progress: Goal set today  Visit Information  Last PT Received On: 05/26/12 Assistance Needed: +2 (safety, chair follow)    Subjective Data  Subjective: I knew yall were coming. Patient Stated Goal: to return home.    Prior Functioning  Home Living Lives With: Spouse Available Help at Discharge: Family;Available 24 hours/day Type of Home: House Home Access: Stairs to enter [employer] of Steps: 1 Entrance Stairs-Rails: None Home Layout: One level Bathroom Shower/Tub: Door;Walk-in Stage manager: Standard Home Adaptive Equipment: Built-in shower seat Additional Comments: has flexible shower head Prior Function Level of Independence: Independent Able to Take Stairs?: Yes Driving: Yes Vocation: Part time employment Communication Communication: No difficulties    Cognition  Cognition Arousal/Alertness: Awake/alert Behavior During Therapy: WFL for tasks assessed/performed Overall Cognitive Status: Within Functional Limits for tasks assessed    Extremity/Trunk Assessment Right Upper Extremity Assessment RUE ROM/Strength/Tone: Within functional levels Left Upper Extremity Assessment LUE ROM/Strength/Tone: Within functional levels Right Lower Extremity Assessment RLE ROM/Strength/Tone: Deficits RLE ROM/Strength/Tone Deficits: ankle motions WFL, unable to perform SLR without assist from therapist, will assess knee flex during pm session with exercises.  RLE Sensation: WFL - Light Touch Left Lower Extremity Assessment LLE ROM/Strength/Tone: WFL for tasks assessed;Deficits LLE ROM/Strength/Tone Deficits: Pt states that his L knee is also  "bad" LLE Sensation: WFL - Light Touch Trunk Assessment Trunk Assessment: Normal   Balance    End of Session PT - End of Session Equipment Utilized During Treatment: Gait belt;Right knee immobilizer Activity Tolerance: Patient limited by fatigue;Patient limited by pain Patient left: in chair;with call bell/phone within reach Nurse Communication: Mobility status (HR during mobility. )  GP     Leeandre Nordling, Meribeth Mattes 05/26/2012, 10:24 AM

## 2012-05-26 NOTE — Progress Notes (Signed)
Physical Therapy Treatment Patient Details Name: Ryan Roach MRN: [id number] DOB: Jul 05, 1945 Today's Date: 05/26/2012 Time: 1537-1600 PT Time Calculation (min): 23 min  PT Assessment / Plan / Recommendation Comments on Treatment Session  Pt progressing slowly and has increased pain in LLE this afternoon.  Asked pt if he felt as though he would be ok to return home and he states that he does.      Follow Up Recommendations  Home health PT;Supervision for mobility/OOB     Does the patient have the potential to tolerate intense rehabilitation     Barriers to Discharge        Equipment Recommendations  Rolling walker with 5" wheels (bari RW)    Recommendations for Other Services    Frequency 7X/week   Plan Discharge plan remains appropriate    Precautions / Restrictions Precautions Precautions: Knee;Fall Required Braces or Orthoses: Knee Immobilizer - Right Knee Immobilizer - Right: Discontinue once straight leg raise with < 10 degree lag Restrictions Weight Bearing Restrictions: No Other Position/Activity Restrictions: WBAT   Pertinent Vitals/Pain 6/10 pain, premedicated, ice pack applied    Mobility  Bed Mobility Bed Mobility: Supine to Sit Supine to Sit: 4: Min assist;HOB elevated;With rails Details for Bed Mobility Assistance: Assist for RLE out of bed again with max cues for using hands on bed rather than reaching for arm rails.  Transfers Transfers: Sit to Stand;Stand to Sit Sit to Stand: 1: +2 Total assist;From bed;From elevated surface Sit to Stand: Patient Percentage: 70% Stand to Sit: 1: +2 Total assist;With upper extremity assist;With armrests;To chair/3-in-1 Stand to Sit: Patient Percentage: 70% Details for Transfer Assistance: Cues for hand placement and LE management; difficulty controlling descent when sitting with max cues for safety and extending RLE in front of him when sitting.  Ambulation/Gait Ambulation/Gait Assistance: 1: +2 Total  assist Ambulation/Gait: Patient Percentage: 70% Ambulation Distance (Feet): 25 Feet Assistive device: Rolling walker Ambulation/Gait Assistance Details: MAX cues for sequencing/technique with RW and to maintain upright posture.  Did somewhat better than am session, however states increased pain in LLE.  Gait Pattern: Step-to pattern;Decreased stride length;Antalgic;Trunk flexed Gait velocity: decreased    Exercises Total Joint Exercises Ankle Circles/Pumps: AROM;Both;20 reps Quad Sets: AROM;Right;10 reps Heel Slides: AAROM;Right;10 reps Hip ABduction/ADduction: AAROM;Right;10 reps Straight Leg Raises: AAROM;Right;10 reps Goniometric ROM: approx 10-40 deg, very limited and painful   PT Diagnosis:    PT Problem List:   PT Treatment Interventions:     PT Goals Acute Rehab PT Goals PT Goal Formulation: With patient Time For Goal Achievement: 06/02/12 Potential to Achieve Goals: Good Pt will go Supine/Side to Sit: with supervision PT Goal: Supine/Side to Sit - Progress: Progressing toward goal Pt will go Sit to Stand: with supervision PT Goal: Sit to Stand - Progress: Progressing toward goal Pt will go Stand to Sit: with supervision PT Goal: Stand to Sit - Progress: Progressing toward goal Pt will Ambulate: 51 - 150 feet;with supervision;with least restrictive assistive device PT Goal: Ambulate - Progress: Progressing toward goal Pt will Perform Home Exercise Program: with supervision, verbal cues required/provided PT Goal: Perform Home Exercise Program - Progress: Progressing toward goal  Visit Information  Last PT Received On: 05/26/12 Assistance Needed: +2    Subjective Data  Subjective: I didn't like all this moving around.  Patient Stated Goal: to return home.    Cognition  Cognition Arousal/Alertness: Awake/alert Behavior During Therapy: WFL for tasks assessed/performed Overall Cognitive Status: Within Functional Limits for tasks assessed    Balance  End of  Session PT - End of Session Equipment Utilized During Treatment: Gait belt;Right knee immobilizer Activity Tolerance: Patient limited by fatigue;Patient limited by pain Patient left: in chair;with call bell/phone within reach Nurse Communication: Mobility status   GP     Vista Deck 05/26/2012, 4:30 PM

## 2012-05-26 NOTE — Progress Notes (Signed)
Subjective: 1 Day Post-Op Procedure(s) (LRB): RIGHT TOTAL KNEE ARTHROPLASTY (Right) Patient reports pain as 4 on 0-10 scale.Hemovac DCd and dressing changed and wound looks fine. Admitted to ICU Step Down because of possible Sleep Apnea. Anesthesia recommended this and I strongly agree.   Objective: Vital signs in last 24 hours: Temp:  [97.8 F (36.6 C)-99.1 F (37.3 C)] 99.1 F (37.3 C) (04/18 0400) Pulse Rate:  [78-123] 111 (04/18 0600) Resp:  [10-22] 17 (04/18 0600) BP: (110-172)/(57-100) 142/73 mmHg (04/18 0600) SpO2:  [90 %-100 %] 95 % (04/18 0600) Weight:  [146.2 kg (322 lb 5 oz)-146.4 kg (322 lb 12.1 oz)] 146.4 kg (322 lb 12.1 oz) (04/18 0000)  Intake/Output from previous day: 04/17 0701 - 04/18 0700 In: 3650 [I.V.:3100; IV Piggyback:550] Out: 1850 [Urine:1200; Drains:600; Blood:50] Intake/Output this shift:     Recent Labs  05/26/12 0340  HGB 11.8*    Recent Labs  05/26/12 0340  WBC 11.1*  RBC 3.99*  HCT 36.0*  PLT 304    Recent Labs  05/26/12 0340  NA 136  K 4.0  CL 101  CO2 26  BUN 15  CREATININE 0.62  GLUCOSE 125*  CALCIUM 8.1*   No results found for this basename: LABPT, INR,  in the last 72 hours  Dorsiflexion/Plantar flexion intact No cellulitis present  Assessment/Plan: 1 Day Post-Op Procedure(s) (LRB): RIGHT TOTAL KNEE ARTHROPLASTY (Right) Up with therapy  Ryan Roach A 05/26/2012, 7:47 AM

## 2012-05-26 NOTE — Evaluation (Addendum)
Occupational Therapy Evaluation Patient Details Name: Ryan Roach MRN: [id number] DOB: 1945-02-25 Today's Date: 05/26/2012 Time: 4098-1191 OT Time Calculation (min): 29 min  OT Assessment / Plan / Recommendation Clinical Impression  This 67 year old man was admitted for R TKA.  He was seen in ICU/SDU and anticipate he will move to regular floor today.  He will benefit from skilled OT to educate on adls, safe bathroom transfers and DME.      OT Assessment  Patient needs continued OT Services    Follow Up Recommendations  No OT follow up (likely)    Barriers to Discharge      Equipment Recommendations  3 in 1 bedside comode (wide)    Recommendations for Other Services    Frequency  Min 2X/week    Precautions / Restrictions Precautions Precautions: Knee;Fall Required Braces or Orthoses: Knee Immobilizer - Right Knee Immobilizer - Right: Discontinue once straight leg raise with < 10 degree lag Restrictions Weight Bearing Restrictions: No Other Position/Activity Restrictions: WBAT   Pertinent Vitals/Pain Premedicated.  Had a little pain in bed, increased with movement and weight bearing--not rated.  Repositioned and ice applied.  HR 88-140.  02 98-100%.  Removed 02 during tx and reapplied (2 liters)    ADL  Grooming: Set up Where Assessed - Grooming: Unsupported sitting Upper Body Bathing: Set up Where Assessed - Upper Body Bathing: Unsupported sitting Lower Body Bathing: +2 Total assistance (with long sponge (has)) Lower Body Bathing: Patient Percentage: 80% Where Assessed - Lower Body Bathing: Supported sit to stand Upper Body Dressing: Minimal assistance (lines) Where Assessed - Upper Body Dressing: Unsupported sitting Lower Body Dressing: +2 Total assistance Lower Body Dressing: Patient Percentage: 20% Where Assessed - Lower Body Dressing: Supported sit to stand Toilet Transfer: Simulated;+2 Total assistance--only performed sit to stand from elevated bed Toilet  Transfer: Patient Percentage: 70% Toilet Transfer Method: Sit to Barista:  (bed, ambulated to door and then to recliner) Toileting - Architect and Hygiene: +2 Total assistance Toileting - Clothing Manipulation and Hygiene: Patient Percentage: 50% Equipment Used: Knee Immobilizer;Rolling walker Transfers/Ambulation Related to ADLs: ambulated to door:  pt's HR up to 140 ADL Comments: has long sponge. He is familiar with reacher but doesn't have this.   Wife has been helping with socks.    Pt tended to hold breath with bed mobility.  He needed rest breaks.  Pt was working up until surgery    OT Diagnosis: Generalized weakness  OT Problem List: Decreased strength;Decreased activity tolerance;Cardiopulmonary status limiting activity;Obesity;Pain OT Treatment Interventions: Self-care/ADL training;DME and/or AE instruction;Patient/family education;Energy conservation   OT Goals Acute Rehab OT Goals OT Goal Formulation: With patient Time For Goal Achievement: 06/02/12 Potential to Achieve Goals: Good ADL Goals Pt Will Perform Grooming: with supervision;Standing at sink;Supported ADL Goal: Grooming - Progress: Goal set today Pt Will Transfer to Toilet: with supervision;3-in-1;Ambulation (and complete all aspects of toileting) ADL Goal: Toilet Transfer - Progress: Goal set today Pt Will Perform Tub/Shower Transfer: Shower transfer;with min assist;Ambulation (min guard) ADL Goal: Tub/Shower Transfer - Progress: Goal set today Miscellaneous OT Goals Miscellaneous OT Goal #1: pt will initiate rest breaks for energy conservation independently OT Goal: Miscellaneous Goal #1 - Progress: Goal set today  Visit Information  Last OT Received On: 05/26/12 Assistance Needed: +2 (lines) PT/OT Co-Evaluation/Treatment: Yes    Subjective Data  Subjective: both of my knees are bad. I fell at Thanksgiving.  My wife had just mopped the floor of all things. Patient  Stated  Goal: home, get healed and get other one done   Prior Functioning     Home Living Lives With: Spouse Available Help at Discharge: Family;Available 24 hours/day Type of Home: House Home Access: Stairs to enter [employer] of Steps: 1 Entrance Stairs-Rails: None Home Layout: One level Bathroom Shower/Tub: Door;Walk-in Stage manager: Standard Home Adaptive Equipment: Built-in shower seat Additional Comments: has flexible shower head Prior Function Level of Independence: Independent Able to Take Stairs?: Yes Driving: Yes Vocation: Part time employment Communication Communication: No difficulties         Vision/Perception     Cognition  Cognition Arousal/Alertness: Awake/alert Behavior During Therapy: WFL for tasks assessed/performed Overall Cognitive Status: Within Functional Limits for tasks assessed    Extremity/Trunk Assessment Right Upper Extremity Assessment RUE ROM/Strength/Tone: Within functional levels Left Upper Extremity Assessment LUE ROM/Strength/Tone: Within functional levels     Mobility Bed Mobility Bed Mobility: Supine to Sit Supine to Sit: 4: Min assist (cues not to hold breath) Transfers Transfers: Sit to Stand;Stand to Sit Sit to Stand: 1: +2 Total assist;From bed;From elevated surface Sit to Stand: Patient Percentage: 70% Details for Transfer Assistance: cues for hand placement; difficulty controlling descent when sitting     Exercise     Balance     End of Session OT - End of Session Activity Tolerance: Patient limited by fatigue;Patient limited by pain Patient left: in chair;with call bell/phone within reach;with family/visitor present  GO     Aliene Tamura 05/26/2012, 9:35 AM Marica Otter, OTR/L [phone] 05/26/2012

## 2012-05-27 DIAGNOSIS — D62 Acute posthemorrhagic anemia: Secondary | ICD-10-CM | POA: Diagnosis not present

## 2012-05-27 LAB — CBC
HCT: 29.8 % — ABNORMAL LOW (ref 39.0–52.0)
Hemoglobin: 9.9 g/dL — ABNORMAL LOW (ref 13.0–17.0)
MCH: 29.9 pg (ref 26.0–34.0)
MCV: 90 fL (ref 78.0–100.0)
RBC: 3.31 MIL/uL — ABNORMAL LOW (ref 4.22–5.81)

## 2012-05-27 LAB — BASIC METABOLIC PANEL
BUN: 15 mg/dL (ref 6–23)
CO2: 30 mEq/L (ref 19–32)
Calcium: 8.3 mg/dL — ABNORMAL LOW (ref 8.4–10.5)
Glucose, Bld: 112 mg/dL — ABNORMAL HIGH (ref 70–99)
Sodium: 133 mEq/L — ABNORMAL LOW (ref 135–145)

## 2012-05-27 NOTE — Progress Notes (Signed)
Physical Therapy Treatment Patient Details Name: Ryan Roach MRN: [id number] DOB: December 27, 1945 Today's Date: 05/27/2012 Time: 4098-1191 PT Time Calculation (min): 40 min  PT Assessment / Plan / Recommendation Comments on Treatment Session  Pt with marked improvement from this morning, however distance still limited due to weakness in LLE.  Continue to feel that he may benefit from ST SNF to better strengthen B LE for safe D/C home.     Follow Up Recommendations  Home health PT;SNF     Does the patient have the potential to tolerate intense rehabilitation     Barriers to Discharge        Equipment Recommendations  Rolling walker with 5" wheels Ryan Roach)    Recommendations for Other Services    Frequency 7X/week   Plan Discharge plan needs to be updated    Precautions / Restrictions Precautions Precautions: Knee;Fall Required Braces or Orthoses: Knee Immobilizer - Right Knee Immobilizer - Right: Discontinue once straight leg raise with < 10 degree lag Restrictions Weight Bearing Restrictions: No Other Position/Activity Restrictions: WBAT   Pertinent Vitals/Pain 4/10 pain, ice packs applied    Mobility  Bed Mobility Bed Mobility: Sit to Supine Sit to Supine: 4: Min assist Details for Bed Mobility Assistance: Assist for RLE into bed with cues for adjusting hips once in bed.  Transfers Transfers: Sit to Stand;Stand to Sit Sit to Stand: 4: Min assist;From elevated surface;With upper extremity assist;From chair/3-in-1;From bed Stand to Sit: 4: Min assist;With upper extremity assist;With armrests;To bed;To chair/3-in-1 Details for Transfer Assistance: Performed x 2 in order to rest after ambulation with MAX cues for hand placement (esp when sitting).  Educated pt that getting in/out of lower surface bed at home may be easier (his bed at this time is very high) Ambulation/Gait Ambulation/Gait Assistance: 1: +2 Total assist Ambulation/Gait: Patient Percentage: 70% Ambulation  Distance (Feet): 30 Feet (x2) Assistive device: Rolling walker Ambulation/Gait Assistance Details: Continue to provide cues for sequencing/technique with Roach, however much improved from this morning.  Also cues for upright posture.  Gait Pattern: Step-to pattern;Decreased stride length;Antalgic;Trunk flexed Gait velocity: decreased    Exercises Total Joint Exercises Heel Slides: AAROM;Right;10 reps Straight Leg Raises: AAROM;Right;10 reps Goniometric ROM: 44 deg knee flex   PT Diagnosis:    PT Problem List:   PT Treatment Interventions:     PT Goals Acute Rehab PT Goals PT Goal Formulation: With patient Time For Goal Achievement: 06/02/12 Potential to Achieve Goals: Good Pt will go Sit to Supine/Side: with supervision PT Goal: Sit to Supine/Side - Progress: Progressing toward goal Pt will go Sit to Stand: with supervision PT Goal: Sit to Stand - Progress: Progressing toward goal Pt will go Stand to Sit: with supervision PT Goal: Stand to Sit - Progress: Progressing toward goal Pt will Ambulate: 51 - 150 feet;with supervision;with least restrictive assistive device PT Goal: Ambulate - Progress: Progressing toward goal Pt will Perform Home Exercise Program: with supervision, verbal cues required/provided PT Goal: Perform Home Exercise Program - Progress: Progressing toward goal  Visit Information  Last PT Received On: 05/27/12 Assistance Needed: +2    Subjective Data  Subjective: I feel a lot better this afternoon.  Patient Stated Goal: to return home.    Cognition  Cognition Arousal/Alertness: Awake/alert Behavior During Therapy: WFL for tasks assessed/performed Overall Cognitive Status: Within Functional Limits for tasks assessed    Balance     End of Session PT - End of Session Equipment Utilized During Treatment: Gait belt;Right knee immobilizer Activity  Tolerance: Patient limited by fatigue;Patient limited by pain Patient left: in bed;with call bell/phone within  reach Nurse Communication: Mobility status   GP     Vista Deck 05/27/2012, 3:03 PM

## 2012-05-27 NOTE — Progress Notes (Addendum)
Occupational Therapy Treatment Patient Details Name: Ryan Roach MRN: [id number] DOB: 11/08/1945 Today's Date: 05/27/2012 Time: 1320-1401 OT Time Calculation (min): 41 min  OT Assessment / Plan / Recommendation Comments on Treatment Session  Pt with slow progress due to fatigue and instability Lt. Knee when up.  Pt currently is requiring mod A for ADLs, and requires 2+ assist for safety when ambulating.   Pt. May benefit from a short SNF stay to allow him to increase independence and safety before d/c home.    Follow Up Recommendations  SNF;Home health OT;Supervision/Assistance - 24 hour    Barriers to Discharge       Equipment Recommendations  None recommended by OT    Recommendations for Other Services    Frequency Min 2X/week   Plan Discharge plan needs to be updated    Precautions / Restrictions Precautions Precautions: Knee;Fall Required Braces or Orthoses: Knee Immobilizer - Right Knee Immobilizer - Right: Discontinue once straight leg raise with < 10 degree lag Restrictions Weight Bearing Restrictions: No Other Position/Activity Restrictions: WBAT   Pertinent Vitals/Pain     ADL  Lower Body Dressing: Moderate assistance (with pants using reacher) Where Assessed - Lower Body Dressing: Supported sit to Pharmacist, hospital: Simulated;+2 Total assistance Toilet Transfer: Patient Percentage: 70% Statistician Method: Sit to stand Toileting - Architect and Hygiene: Moderate assistance Where Assessed - Toileting Clothing Manipulation and Hygiene: Standing Equipment Used: Knee Immobilizer;Rolling walker Transfers/Ambulation Related to ADLs: Ambulated with +2 total A, pt ~70% ADL Comments: Pt instructed in use of reacher for LB ADLs   OT Diagnosis:    OT Problem List:   OT Treatment Interventions:     OT Goals Acute Rehab OT Goals OT Goal Formulation: With patient Time For Goal Achievement: 06/02/12 Potential to Achieve Goals: Good ADL Goals Pt Will  Perform Grooming: with supervision;Standing at sink;Supported Engineer, water to Toilet: with supervision;3-in-1;Ambulation ADL Goal: Toilet Transfer - Progress: Progressing toward goals ADL Goal: Tub/Shower Transfer - Progress: Discontinued (comment) (Defer to [facility] ) Additional ADL Goal #1: Pt will demonstrate/verbalize understanding of use and acquisition  AE for LB ADLs ADL Goal: Additional Goal #1 - Progress: Goal set today Miscellaneous OT Goals Miscellaneous OT Goal #1: pt will initiate rest breaks for energy conservation independently OT Goal: Miscellaneous Goal #1 - Progress: Progressing toward goals  Visit Information  Last OT Received On: 05/27/12 Assistance Needed: +2 (for ambulation/safety) PT/OT Co-Evaluation/Treatment: Yes    Subjective Data      Prior Functioning       Cognition  Cognition Arousal/Alertness: Awake/alert Behavior During Therapy: WFL for tasks assessed/performed Overall Cognitive Status: Within Functional Limits for tasks assessed    Mobility  Bed Mobility Bed Mobility: Sit to Supine Supine to Sit: 4: Min assist;HOB flat Sit to Supine: 4: Min assist Details for Bed Mobility Assistance: Assist for RLE into bed with cues for adjusting hips once in bed.  Transfers Transfers: Sit to Stand;Stand to Sit Sit to Stand: 4: Min assist;From elevated surface;With upper extremity assist;From chair/3-in-1;From bed Stand to Sit: 4: Min assist;With upper extremity assist;With armrests;To bed;To chair/3-in-1 Details for Transfer Assistance: Performed x 2 in order to rest after ambulation with MAX cues for hand placement (esp when sitting).  Educated pt that getting in/out of lower surface bed at home may be easier (his bed at this time is very high)    Exercises    Balance     End of Session OT - End of Session Equipment Utilized  During Treatment: Right knee immobilizer;Gait belt Activity Tolerance: Patient limited by fatigue;Patient limited by pain Patient  left: in chair;with call bell/phone within reach;with family/visitor present  GO     Samarrah Tranchina, Ursula Alert M 05/27/2012, 3:36 PM

## 2012-05-27 NOTE — Progress Notes (Signed)
Physical Therapy Treatment Patient Details Name: Ryan Roach MRN: [id number] DOB: 1945-02-21 Today's Date: 05/27/2012 Time: 1478-2956 PT Time Calculation (min): 33 min  PT Assessment / Plan / Recommendation Comments on Treatment Session  Pt continues to progress very slowly and feel that he will benefit from ST SNF for increased safety and to maximize pts function for safe return home.  Pt states he would rather go home, but will consider doing what is best.     Follow Up Recommendations  Home health PT;SNF     Does the patient have the potential to tolerate intense rehabilitation     Barriers to Discharge        Equipment Recommendations  Rolling walker with 5" wheels Ryan Roach RW)    Recommendations for Other Services    Frequency 7X/week   Plan Discharge plan needs to be updated    Precautions / Restrictions Precautions Precautions: Knee;Fall Required Braces or Orthoses: Knee Immobilizer - Right Knee Immobilizer - Right: Discontinue once straight leg raise with < 10 degree lag Restrictions Weight Bearing Restrictions: No Other Position/Activity Restrictions: WBAT   Pertinent Vitals/Pain 6/10 pain, ice packs applied    Mobility  Bed Mobility Bed Mobility: Supine to Sit Supine to Sit: 4: Min assist;HOB flat Details for Bed Mobility Assistance: Continue to provide assist for RLE with MAX cues for using hands on bed instead of rails to better simulate home environment, however pt continues to reach for objects in front of him to assist.  Transfers Transfers: Sit to Stand;Stand to Sit Sit to Stand: From elevated surface;With upper extremity assist;From bed;From chair/3-in-1;With armrests;4: Min assist;3: Mod assist Stand to Sit: 4: Min assist;3: Mod assist;With upper extremity assist;With armrests;To chair/3-in-1 Details for Transfer Assistance: Performed x 2 in order to use 3in1 in restroom with MAX cues for hand placement and LE management when sitting/standing.    Ambulation/Gait Ambulation/Gait Assistance: 3: Mod assist Ambulation Distance (Feet): 15 Feet (then another 18) Assistive device: Rolling walker Ambulation/Gait Assistance Details: Pt continues to require MAX verbal and manual cues with sequencing/technique with RW and to maintain upright posture.  Pt unable to ambulate any further this morning due to pain and weakness in LLE.  Gait Pattern: Step-to pattern;Decreased stride length;Antalgic;Trunk flexed Gait velocity: decreased    Exercises Total Joint Exercises Ankle Circles/Pumps: AROM;Both;20 reps Quad Sets: AROM;Right;10 reps Heel Slides: AAROM;Right;10 reps Hip ABduction/ADduction: AAROM;Right;10 reps Straight Leg Raises: AAROM;Right;10 reps Goniometric ROM: 10-38 deg   PT Diagnosis:    PT Problem List:   PT Treatment Interventions:     PT Goals Acute Rehab PT Goals PT Goal Formulation: With patient Time For Goal Achievement: 06/02/12 Potential to Achieve Goals: Good Pt will go Supine/Side to Sit: with supervision PT Goal: Supine/Side to Sit - Progress: Progressing toward goal Pt will go Sit to Stand: with supervision PT Goal: Sit to Stand - Progress: Progressing toward goal Pt will go Stand to Sit: with supervision PT Goal: Stand to Sit - Progress: Progressing toward goal Pt will Ambulate: 51 - 150 feet;with supervision;with least restrictive assistive device PT Goal: Ambulate - Progress: Progressing toward goal Pt will Perform Home Exercise Program: with supervision, verbal cues required/provided PT Goal: Perform Home Exercise Program - Progress: Progressing toward goal  Visit Information  Last PT Received On: 05/27/12 Assistance Needed: +2    Subjective Data  Subjective: My left knee is going to give out.  Patient Stated Goal: to return home.    Cognition  Cognition Arousal/Alertness: Awake/alert Behavior During Therapy: [facility]  for tasks assessed/performed Overall Cognitive Status: Within Functional Limits for  tasks assessed    Balance     End of Session PT - End of Session Equipment Utilized During Treatment: Gait belt;Right knee immobilizer Activity Tolerance: Patient limited by fatigue;Patient limited by pain Patient left: in chair;with call bell/phone within reach Nurse Communication: Mobility status   GP     Vista Deck 05/27/2012, 8:32 AM

## 2012-05-27 NOTE — Progress Notes (Signed)
Occupational Therapy   05/27/12 1513  OT Visit Information  Last OT Received On 05/27/12  Assistance Needed +2  OT Time Calculation  OT Start Time 1504  OT Stop Time 1513  OT Time Calculation (min) 9 min  Precautions  Precautions Knee;Fall  Required Braces or Orthoses Knee Immobilizer - Right  Knee Immobilizer - Right Discontinue once straight leg raise with < 10 degree lag  Restrictions  Weight Bearing Restrictions No  Other Position/Activity Restrictions WBAT  ADL  ADL Comments Pt was instructed in use and acquisition of all AE.  Pt reports he will acquire a reacher and wife will assist with socks as needed.  Pt fatigued and did not wish to move OOB again  Cognition  Arousal/Alertness Awake/alert  Behavior During Therapy [facility] for tasks assessed/performed  Overall Cognitive Status Within Functional Limits for tasks assessed  OT - End of Session  Activity Tolerance Patient limited by fatigue  Patient left in bed;with call bell/phone within reach;with family/visitor present  OT Assessment/Plan  OT Plan Discharge plan needs to be updated  OT Frequency Min 2X/week  Follow Up Recommendations SNF;Home health OT;Supervision/Assistance - 24 hour  OT Equipment None recommended by OT  ADL Goals  Pt Will Perform Grooming with supervision;Standing at sink;Supported  Engineer, water to Toilet with supervision;3-in-1;Ambulation  Additional ADL Goal #1 Pt will demonstrate/verbalize understanding of use and acquisition  AE for LB ADLs  ADL Goal: Additional Goal #1 - Progress Met  Miscellaneous OT Goals  Miscellaneous OT Goal #1 pt will initiate rest breaks for energy conservation independently  OT General Charges  $OT Visit 1 Procedure  OT Treatments  $Self Care/Home Management  8-22 mins   [employer], OTR/L [phone]

## 2012-05-27 NOTE — Progress Notes (Signed)
Subjective: 2 Days Post-Op Procedure(s) (LRB): RIGHT TOTAL KNEE ARTHROPLASTY (Right) Patient reports pain as 4 on 0-10 scale. Needs more PT. He is progressing slowly.   Objective: Vital signs in last 24 hours: Temp:  [98 F (36.7 C)-99.2 F (37.3 C)] 98 F (36.7 C) (04/19 0600) Pulse Rate:  [74-110] 85 (04/19 0600) Resp:  [14-18] 16 (04/19 0600) BP: (109-134)/(58-76) 129/68 mmHg (04/19 0600) SpO2:  [90 %-98 %] 96 % (04/19 0600)  Intake/Output from previous day: 04/18 0701 - 04/19 0700 In: 2370 [I.V.:2370] Out: 725 [Urine:725] Intake/Output this shift:     Recent Labs  05/26/12 0340 05/27/12 0519  HGB 11.8* 9.9*    Recent Labs  05/26/12 0340 05/27/12 0519  WBC 11.1* 10.9*  RBC 3.99* 3.31*  HCT 36.0* 29.8*  PLT 304 213    Recent Labs  05/26/12 0340 05/27/12 0519  NA 136 133*  K 4.0 4.0  CL 101 98  CO2 26 30  BUN 15 15  CREATININE 0.62 0.66  GLUCOSE 125* 112*  CALCIUM 8.1* 8.3*   No results found for this basename: LABPT, INR,  in the last 72 hours  Neurologically intact Dorsiflexion/Plantar flexion intact Compartment soft  Assessment/Plan: 2 Days Post-Op Procedure(s) (LRB): RIGHT TOTAL KNEE ARTHROPLASTY (Right) Up with therapy .DC Plans for tomorrow.  Deyra Perdomo A 05/27/2012, 7:49 AM

## 2012-05-28 LAB — CBC
Hemoglobin: 8.5 g/dL — ABNORMAL LOW (ref 13.0–17.0)
MCH: 30.4 pg (ref 26.0–34.0)
MCV: 90.4 fL (ref 78.0–100.0)
Platelets: 199 10*3/uL (ref 150–400)
RBC: 2.8 MIL/uL — ABNORMAL LOW (ref 4.22–5.81)

## 2012-05-28 NOTE — Progress Notes (Signed)
He is given d/c instructions and prescriptions; and his wife signs for receipt of same.  Our case manager has arranged follow-up home care through [facility]; including home physical therapy and the delivery of a bariatric wheeled walker.  P.T. Has worked with him twice today for extended periods of time; and he has done well ambulating (slowly) with walker.  Knee immobilizer applied to right leg just prior to d/c.

## 2012-05-28 NOTE — Discharge Summary (Signed)
Physician Discharge Summary   Patient ID: Ryan Roach MRN: [id number] DOB/AGE: [birth year] 67 y.o.  Admit date: 05/25/2012 Discharge date: 05/28/2012  Primary Diagnosis: Osteoarthritis, right knee  Admission Diagnoses:  Past Medical History  Diagnosis Date  . Arthritis    Discharge Diagnoses:   Principal Problem:   Osteoarthritis of right knee Active Problems:   Morbid obesity   Acute blood loss anemia  Estimated body mass index is 45.03 kg/(m^2) as calculated from the following:   Height as of this encounter: 5\' 11"  (1.803 m).   Weight as of this encounter: 146.4 kg (322 lb 12.1 oz).  Procedure:  Procedure(s) (LRB): RIGHT TOTAL KNEE ARTHROPLASTY (Right)   Consults: None  HPI: Ryan Roach, 67 y.o. male, has a history of pain and functional disability in the right knee due to arthritis and has failed non-surgical conservative treatments for greater than 12 weeks to includeNSAID's and/or analgesics, weight reduction as appropriate and activity modification. Onset of symptoms was gradual, starting >10 years ago with gradually worsening course since that time. The patient noted no past surgery on the right knee(s). Patient currently rates pain in the right knee(s) at 7 out of 10 with activity. Patient has night pain, worsening of pain with activity and weight bearing, pain that interferes with activities of daily living, pain with passive range of motion, crepitus and joint swelling. Patient has evidence of periarticular osteophytes, joint subluxation   Laboratory Data: Admission on 05/25/2012, Discharged on 05/28/2012  Component Date Value Range Status  . WBC 05/26/2012 11.1* 4.0 - 10.5 K/uL Final  . RBC 05/26/2012 3.99* 4.22 - 5.81 MIL/uL Final  . Hemoglobin 05/26/2012 11.8* 13.0 - 17.0 g/dL Final  . HCT 78/29/5621 36.0* 39.0 - 52.0 % Final  . MCV 05/26/2012 90.2  78.0 - 100.0 fL Final  . MCH 05/26/2012 29.6  26.0 - 34.0 pg Final  . MCHC 05/26/2012 32.8  30.0 - 36.0 g/dL Final    . RDW 30/86/5784 13.5  11.5 - 15.5 % Final  . Platelets 05/26/2012 304  150 - 400 K/uL Final  . Sodium 05/26/2012 136  135 - 145 mEq/L Final  . Potassium 05/26/2012 4.0  3.5 - 5.1 mEq/L Final  . Chloride 05/26/2012 101  96 - 112 mEq/L Final  . CO2 05/26/2012 26  19 - 32 mEq/L Final  . Glucose, Bld 05/26/2012 125* 70 - 99 mg/dL Final  . BUN 69/62/9528 15  6 - 23 mg/dL Final  . Creatinine, Ser 05/26/2012 0.62  0.50 - 1.35 mg/dL Final  . Calcium 41/32/4401 8.1* 8.4 - 10.5 mg/dL Final  . GFR calc non Af Amer 05/26/2012 >90  >90 mL/min Final  . GFR calc Af Amer 05/26/2012 >90  >90 mL/min Final   Comment:                                 The eGFR has been calculated                          using the CKD EPI equation.                          This calculation has not been                          validated in all clinical  situations.                          eGFR's persistently                          <90 mL/min signify                          possible Chronic Kidney Disease.  . WBC 05/27/2012 10.9* 4.0 - 10.5 K/uL Final  . RBC 05/27/2012 3.31* 4.22 - 5.81 MIL/uL Final  . Hemoglobin 05/27/2012 9.9* 13.0 - 17.0 g/dL Final  . HCT 16/11/9602 29.8* 39.0 - 52.0 % Final  . MCV 05/27/2012 90.0  78.0 - 100.0 fL Final  . MCH 05/27/2012 29.9  26.0 - 34.0 pg Final  . MCHC 05/27/2012 33.2  30.0 - 36.0 g/dL Final  . RDW 54/10/8117 13.7  11.5 - 15.5 % Final  . Platelets 05/27/2012 213  150 - 400 K/uL Final   Comment: DELTA CHECK NOTED                          REPEATED TO VERIFY                          SPECIMEN CHECKED FOR CLOTS  . Sodium 05/27/2012 133* 135 - 145 mEq/L Final  . Potassium 05/27/2012 4.0  3.5 - 5.1 mEq/L Final  . Chloride 05/27/2012 98  96 - 112 mEq/L Final  . CO2 05/27/2012 30  19 - 32 mEq/L Final  . Glucose, Bld 05/27/2012 112* 70 - 99 mg/dL Final  . BUN 14/78/2956 15  6 - 23 mg/dL Final  . Creatinine, Ser 05/27/2012 0.66  0.50 - 1.35 mg/dL Final  .  Calcium 21/30/8657 8.3* 8.4 - 10.5 mg/dL Final  . GFR calc non Af Amer 05/27/2012 >90  >90 mL/min Final  . GFR calc Af Amer 05/27/2012 >90  >90 mL/min Final   Comment:                                 The eGFR has been calculated                          using the CKD EPI equation.                          This calculation has not been                          validated in all clinical                          situations.                          eGFR's persistently                          <90 mL/min signify                          possible Chronic Kidney Disease.  . WBC 05/28/2012 8.9  4.0 - 10.5 K/uL Final  . RBC 05/28/2012 2.80* 4.22 - 5.81 MIL/uL Final  . Hemoglobin 05/28/2012 8.5* 13.0 - 17.0 g/dL Final  . HCT 60/45/4098 25.3* 39.0 - 52.0 % Final  . MCV 05/28/2012 90.4  78.0 - 100.0 fL Final  . MCH 05/28/2012 30.4  26.0 - 34.0 pg Final  . MCHC 05/28/2012 33.6  30.0 - 36.0 g/dL Final  . RDW 11/91/4782 13.8  11.5 - 15.5 % Final  . Platelets 05/28/2012 199  150 - 400 K/uL Final  Hospital Outpatient Visit on 05/19/2012  Component Date Value Range Status  . MRSA, PCR 05/19/2012 NEGATIVE  NEGATIVE Final  . Staphylococcus aureus 05/19/2012 POSITIVE* NEGATIVE Final   Comment:                                 The Xpert SA Assay (FDA                          approved for NASAL specimens                          in patients over 58 years of age),                          is one component of                          a comprehensive surveillance                          program.  Test performance has                          been validated by [employer] for patients greater                          than or equal to 31 year old.                          It is not intended                          to diagnose infection nor to                          guide or monitor treatment.  Marland Kitchen aPTT 05/19/2012 33  24 - 37 seconds Final  . Sodium 05/19/2012 142  135 - 145  mEq/L Final  . Potassium 05/19/2012 4.0  3.5 - 5.1 mEq/L Final  . Chloride 05/19/2012 106  96 - 112 mEq/L Final  . CO2 05/19/2012 26  19 - 32 mEq/L Final  . Glucose, Bld 05/19/2012 98  70 - 99 mg/dL Final  . BUN 95/62/1308 21  6 - 23 mg/dL Final  . Creatinine, Ser 05/19/2012 0.59  0.50 - 1.35 mg/dL Final  . Calcium 65/78/4696 8.9  8.4 - 10.5 mg/dL Final  . Total Protein 05/19/2012 7.4  6.0 - 8.3 g/dL Final  .  Albumin 05/19/2012 3.6  3.5 - 5.2 g/dL Final  . AST 09/81/1914 19  0 - 37 U/L Final  . ALT 05/19/2012 16  0 - 53 U/L Final  . Alkaline Phosphatase 05/19/2012 136* 39 - 117 U/L Final  . Total Bilirubin 05/19/2012 0.7  0.3 - 1.2 mg/dL Final  . GFR calc non Af Amer 05/19/2012 >90  >90 mL/min Final  . GFR calc Af Amer 05/19/2012 >90  >90 mL/min Final   Comment:                                 The eGFR has been calculated                          using the CKD EPI equation.                          This calculation has not been                          validated in all clinical                          situations.                          eGFR's persistently                          <90 mL/min signify                          possible Chronic Kidney Disease.  Marland Kitchen Prothrombin Time 05/19/2012 13.1  11.6 - 15.2 seconds Final  . INR 05/19/2012 1.00  0.00 - 1.49 Final  . ABO/RH(D) 05/19/2012 A NEG   Final  . Antibody Screen 05/19/2012 NEG   Final  . Sample Expiration 05/19/2012 05/28/2012   Final  . Color, Urine 05/19/2012 Elianah Karis* YELLOW Final   BIOCHEMICALS MAY BE AFFECTED BY COLOR  . APPearance 05/19/2012 CLOUDY* CLEAR Final  . Specific Gravity, Urine 05/19/2012 1.042* 1.005 - 1.030 Final  . pH 05/19/2012 5.5  5.0 - 8.0 Final  . Glucose, UA 05/19/2012 NEGATIVE  NEGATIVE mg/dL Final  . Hgb urine dipstick 05/19/2012 NEGATIVE  NEGATIVE Final  . Bilirubin Urine 05/19/2012 SMALL* NEGATIVE Final  . Ketones, ur 05/19/2012 NEGATIVE  NEGATIVE mg/dL Final  . Protein, ur 78/29/5621 NEGATIVE   NEGATIVE mg/dL Final  . Urobilinogen, UA 05/19/2012 1.0  0.0 - 1.0 mg/dL Final  . Nitrite 30/86/5784 NEGATIVE  NEGATIVE Final  . Leukocytes, UA 05/19/2012 NEGATIVE  NEGATIVE Final   MICROSCOPIC NOT DONE ON URINES WITH NEGATIVE PROTEIN, BLOOD, LEUKOCYTES, NITRITE, OR GLUCOSE <1000 mg/dL.  . WBC 05/19/2012 7.3  4.0 - 10.5 K/uL Final  . RBC 05/19/2012 4.73  4.22 - 5.81 MIL/uL Final  . Hemoglobin 05/19/2012 14.3  13.0 - 17.0 g/dL Final  . HCT 69/62/9528 42.6  39.0 - 52.0 % Final  . MCV 05/19/2012 90.1  78.0 - 100.0 fL Final  . MCH 05/19/2012 30.2  26.0 - 34.0 pg Final  . MCHC 05/19/2012 33.6  30.0 - 36.0 g/dL Final  . RDW 41/32/4401 13.6  11.5 - 15.5 % Final  . Platelets 05/19/2012 286  150 - 400 K/uL Final  . ABO/RH(D) 05/19/2012 A NEG   Final     X-Rays:Dg Chest 2 View  05/19/2012  *RADIOLOGY REPORT*  Clinical Data: Preoperative respiratory films.  CHEST - 2 VIEW  Comparison: None.  Findings: There is cardiomegaly without edema.  No consolidative process, pneumothorax or effusion.  IMPRESSION: Cardiomegaly without acute disease.   Original Report Authenticated By: Holley Dexter, M.D.    X-ray Knee Right Ap And Lateral  05/25/2012  *RADIOLOGY REPORT*  Clinical Data: Post right knee replacement  RIGHT KNEE - 1-2 VIEW  Comparison: None.  Findings: Two portable views of the right knee show the femoral and tibial components of the right knee replacement to be in good position.  A surgical drain is present and there is some air in the soft tissues postoperatively, with a small joint effusion.  IMPRESSION: Right knee replacement components in good position.  Postop changes.   Original Report Authenticated By: Dwyane Dee, M.D.     EKG: Orders placed during the hospital encounter of 05/19/12  . EKG 12-LEAD  . EKG 12-LEAD     Hospital Course: Ryan Roach is a 67 y.o. who was admitted to [facility]. They were brought to the operating room on 05/25/2012 and underwent Procedure(s): RIGHT  TOTAL KNEE ARTHROPLASTY.  Patient tolerated the procedure well and was later transferred to the recovery room and then to the orthopaedic floor for postoperative care.  They were given PO and IV analgesics for pain control following their surgery.  They were given 24 hours of postoperative antibiotics of  Anti-infectives   Start     Dose/Rate Route Frequency Ordered Stop   05/25/12 2030  ceFAZolin (ANCEF) IVPB 1 g/50 mL premix     1 g 100 mL/hr over 30 Minutes Intravenous Every 6 hours 05/25/12 2006 05/26/12 0305   05/25/12 1400  polymyxin B 500,000 Units, bacitracin 50,000 Units in sodium chloride irrigation 0.9 % 500 mL irrigation  Status:  Discontinued       As needed 05/25/12 1400 05/25/12 1555   05/25/12 0830  ceFAZolin (ANCEF) 3 g in dextrose 5 % 50 mL IVPB     3 g 160 mL/hr over 30 Minutes Intravenous On call to O.R. 05/25/12 0981 05/25/12 1331     and started on DVT prophylaxis in the form of Xarelto.   PT and OT were ordered for total joint protocol.  Discharge planning consulted to help with postop disposition and equipment needs.  Patient had a decent night on the evening of surgery.  He was transferred to ICU stepdown per anesthesia recommendation due to sleep apnea.They started to get up OOB with therapy on day one. Hemovac drain was pulled without difficulty.  Continued to work with therapy into day two.  Dressing was changed on day two and the incision was clean and dry.  By day three, the patient had progressed with therapy and meeting their goals.  Incision was healing well.  Patient was seen in rounds and was ready to go home.   Discharge Medications: Prior to Admission medications   Medication Sig Start Date End Date Taking? Authorizing Provider  ferrous sulfate 325 (65 FE) MG tablet Take 1 tablet (325 mg total) by mouth 3 (three) times daily after meals. 05/26/12   Jacki Cones, MD  methocarbamol (ROBAXIN) 500 MG tablet Take 1 tablet (500 mg total) by mouth every 6 (six)  hours as needed. 05/25/12   Jacki Cones, MD  oxyCODONE-acetaminophen (PERCOCET/ROXICET)  5-325 MG per tablet Take 1-2 tablets by mouth every 4 (four) hours as needed. 05/25/12   Jacki Cones, MD  polyethylene glycol (MIRALAX / GLYCOLAX) packet Take 17 g by mouth daily as needed. 05/25/12   Jacki Cones, MD  rivaroxaban (XARELTO) 10 MG TABS tablet Take 1 tablet (10 mg total) by mouth daily with breakfast. 05/26/12   Jacki Cones, MD    Diet: Cardiac diet Activity:WBAT Follow-up:in 2 weeks Disposition - Home Discharged Condition: fair   Discharge Orders   Future Orders Complete By Expires     Call MD / Call 911  As directed     Comments:      If you experience chest pain or shortness of breath, CALL 911 and be transported to the hospital emergency room.  If you develope a fever above 101 F, pus (white drainage) or increased drainage or redness at the wound, or calf pain, call your surgeon's office.    Change dressing  As directed     Comments:      Change dressing daily with sterile 4 x 4 inch gauze dressing    Constipation Prevention  As directed     Comments:      Drink plenty of fluids.  Prune juice may be helpful.  You may use a stool softener, such as Colace (over the counter) 100 mg twice a day.  Use MiraLax (over the counter) for constipation as needed.    Diet - low sodium heart healthy  As directed     Discharge instructions  As directed     Comments:      Walk with your walker. Weight bearing as instructed. Home Health Agency will follow you at home for your therapy  Change your dressing daily. Shower only, no tub bath. Call if any temperatures greater than 101 or any wound complications: [phone] during the day and ask for Dr. Jeannetta Ellis nurse, [facility].    Do not put a pillow under the knee. Place it under the heel.  As directed     Driving restrictions  As directed     Comments:      No driving    Increase activity slowly as tolerated  As directed           Medication List    STOP taking these medications       acetaminophen 500 MG tablet  Commonly known as:  TYLENOL     celecoxib 200 MG capsule  Commonly known as:  CELEBREX     ibuprofen 200 MG tablet  Commonly known as:  ADVIL,MOTRIN      TAKE these medications       ferrous sulfate 325 (65 FE) MG tablet  Take 1 tablet (325 mg total) by mouth 3 (three) times daily after meals.     methocarbamol 500 MG tablet  Commonly known as:  ROBAXIN  Take 1 tablet (500 mg total) by mouth every 6 (six) hours as needed.     oxyCODONE-acetaminophen 5-325 MG per tablet  Commonly known as:  PERCOCET/ROXICET  Take 1-2 tablets by mouth every 4 (four) hours as needed.     polyethylene glycol packet  Commonly known as:  MIRALAX / GLYCOLAX  Take 17 g by mouth daily as needed.     rivaroxaban 10 MG Tabs tablet  Commonly known as:  XARELTO  Take 1 tablet (10 mg total) by mouth daily with breakfast.           Follow-up  Information   Follow up with GIOFFRE,RONALD A, MD. Schedule an appointment as soon as possible for a visit in 2 weeks.   Contact information:   [address] [place] [address] [phone]       Follow up with [facility]. [facility])    Contact information:   [phone]      Signed: Kerby Nora 05/28/2012, 7:52 PM

## 2012-05-28 NOTE — Progress Notes (Signed)
   Subjective: 3 Days Post-Op Procedure(s) (LRB): RIGHT TOTAL KNEE ARTHROPLASTY (Right)   Patient reports pain as mild, pain well controlled with medication. No events throughout the night. He is progressing quite a bit today. He is wanting to be discharged home, if he does well with PT.  Objective:   VITALS:   Filed Vitals:   05/28/12 0537  BP: 122/60  Pulse: 88  Temp: 98.2 F (36.8 C)  Resp: 18    Neurovascular intact Dorsiflexion/Plantar flexion intact Incision: dressing C/D/I No cellulitis present Compartment soft  LABS  Recent Labs  05/26/12 0340 05/27/12 0519 05/28/12 0514  HGB 11.8* 9.9* 8.5*  HCT 36.0* 29.8* 25.3*  WBC 11.1* 10.9* 8.9  PLT 304 213 199     Recent Labs  05/26/12 0340 05/27/12 0519  NA 136 133*  K 4.0 4.0  BUN 15 15  CREATININE 0.62 0.66  GLUCOSE 125* 112*     Assessment/Plan: 3 Days Post-Op Procedure(s) (LRB): RIGHT TOTAL KNEE ARTHROPLASTY (Right) Up with therapy Discharge home with home health Follow up in 2 weeks at [facility]. Follow up with Dr. Darrelyn Hillock in 2 weeks.  Contact information:  [facility] [address] [place] [address] [phone]        Anastasio Auerbach. Sherrilyn Nairn   PAC  05/28/2012, 8:45 AM

## 2012-05-28 NOTE — Progress Notes (Signed)
Physical Therapy Treatment Patient Details Name: Ryan Roach MRN: [id number] DOB: 02-Feb-1946 Today's Date: 05/28/2012 Time: 4098-1191 PT Time Calculation (min): 24 min  PT Assessment / Plan / Recommendation Comments on Treatment Session  Pt continues to show improvement and feel that he will be safe to D/C today.  PT to see him for one more session prior to D/C to ensure safety getting up step.      Follow Up Recommendations  Home health PT;Supervision for mobility/OOB     Does the patient have the potential to tolerate intense rehabilitation     Barriers to Discharge        Equipment Recommendations  Rolling walker with 5" wheels (bari RW)    Recommendations for Other Services    Frequency 7X/week   Plan Discharge plan remains appropriate    Precautions / Restrictions Precautions Precautions: Knee;Fall Required Braces or Orthoses: Knee Immobilizer - Right Knee Immobilizer - Right: Discontinue once straight leg raise with < 10 degree lag Restrictions Weight Bearing Restrictions: No Other Position/Activity Restrictions: WBAT   Pertinent Vitals/Pain 4/10    Mobility  Bed Mobility Bed Mobility: Supine to Sit;Sitting - Scoot to Edge of Bed Supine to Sit: 4: Min assist;HOB flat Sitting - Scoot to [place] of Bed: 4: Min guard Details for Bed Mobility Assistance: Continue to provide assist for RLE out of bed with cues for hand placement to self assist.  Doing somewhat better, but still requires increased time and effort.  Transfers Transfers: Sit to Stand;Stand to Sit Sit to Stand: 3: Mod assist;From bed;With upper extremity assist;From elevated surface Stand to Sit: 4: Min assist;To chair/3-in-1;With armrests;With upper extremity assist Details for Transfer Assistance: Min cues for hand placement, but overall doing better.  Still requires some assist to rise.   Ambulation/Gait Ambulation/Gait Assistance: 4: Min assist;3: Mod assist Ambulation Distance (Feet): 120  Feet Assistive device: Rolling walker Ambulation/Gait Assistance Details: Min cues for sequencing/technique with RW, however overall improved from yesterday.   Gait Pattern: Step-to pattern;Decreased stride length;Antalgic;Trunk flexed    Exercises     PT Diagnosis:    PT Problem List:   PT Treatment Interventions:     PT Goals Acute Rehab PT Goals PT Goal Formulation: With patient Time For Goal Achievement: 06/02/12 Potential to Achieve Goals: Good Pt will go Supine/Side to Sit: with supervision PT Goal: Supine/Side to Sit - Progress: Progressing toward goal Pt will go Sit to Stand: with supervision PT Goal: Sit to Stand - Progress: Progressing toward goal Pt will go Stand to Sit: with supervision PT Goal: Stand to Sit - Progress: Progressing toward goal Pt will Ambulate: 51 - 150 feet;with supervision;with least restrictive assistive device PT Goal: Ambulate - Progress: Progressing toward goal  Visit Information  Last PT Received On: 05/28/12 Assistance Needed: +1    Subjective Data  Subjective: I had a good nights rest and my knee doesn't feel too bad today.  Patient Stated Goal: to return home.    Cognition  Cognition Arousal/Alertness: Awake/alert Behavior During Therapy: WFL for tasks assessed/performed Overall Cognitive Status: Within Functional Limits for tasks assessed    Balance     End of Session PT - End of Session Equipment Utilized During Treatment: Gait belt;Right knee immobilizer Activity Tolerance: Patient tolerated treatment well;Patient limited by fatigue Patient left: in chair;with call bell/phone within reach Nurse Communication: Mobility status   GP     Vista Deck 05/28/2012, 8:46 AM

## 2012-05-28 NOTE — Progress Notes (Signed)
Occupational Therapy Treatment Patient Details Name: Ryan Roach MRN: [id number] DOB: [birth year] Today's Date: 05/28/2012 Time: 0800-0820 OT Time Calculation (min): 20 min  OT Assessment / Plan / Recommendation Comments on Treatment Session Pt making good progress. Possible d/c home this PM.    Follow Up Recommendations  Home health OT;Supervision/Assistance - 24 hour    Barriers to Discharge       Equipment Recommendations  3 in 1 bedside comode    Recommendations for Other Services    Frequency Min 2X/week   Plan Discharge plan remains appropriate;Equipment recommendations need to be updated    Precautions / Restrictions Precautions Precautions: Knee;Fall Required Braces or Orthoses: Knee Immobilizer - Right Knee Immobilizer - Right: Discontinue once straight leg raise with < 10 degree lag Restrictions Weight Bearing Restrictions: No Other Position/Activity Restrictions: WBAT   Pertinent Vitals/Pain See vitals    ADL  Lower Body Bathing: Simulated;Minimal assistance Where Assessed - Lower Body Bathing: Supported sit to stand Lower Body Dressing: Simulated;Moderate assistance Where Assessed - Lower Body Dressing: Supported sit to Pharmacist, hospital: Simulated;Moderate assistance Toilet Transfer Method: Sit to Barista:  (bed; recliner) Equipment Used: Knee Immobilizer;Rolling walker;Gait belt;Reacher;Long-handled sponge Transfers/Ambulation Related to ADLs: min assist with RW ADL Comments: Pt reports he has a long handled sponge that he uses at home.  Daughter is Education officer, community.  Pt declining practice with reacher this morning.  Plans to have family assist with socks and shoes.  Pt able to independently verbalize correct dressing technique (RLE threading first and reverse for doffing) and good safety awareness for home (not carrying items while using RW, picking up throw rugs).  Pt is unsure if daughter will be able to get toilet riser with  rails. Will recommend 3n1 to ensure pt has needed DME at d/c.    OT Diagnosis:    OT Problem List:   OT Treatment Interventions:     OT Goals ADL Goals Pt Will Transfer to Toilet: with supervision;3-in-1;Ambulation ADL Goal: Toilet Transfer - Progress: Progressing toward goals Additional ADL Goal #1: Pt will demonstrate/verbalize understanding of use and acquisition  AE for LB ADLs ADL Goal: Additional Goal #1 - Progress: Progressing toward goals  Visit Information  Last OT Received On: 05/28/12 Assistance Needed: +1 PT/OT Co-Evaluation/Treatment: Yes    Subjective Data      Prior Functioning       Cognition  Cognition Arousal/Alertness: Awake/alert Behavior During Therapy: WFL for tasks assessed/performed Overall Cognitive Status: Within Functional Limits for tasks assessed    Mobility  Bed Mobility Bed Mobility: Supine to Sit;Sitting - Scoot to Edge of Bed Supine to Sit: 4: Min assist;HOB flat Sitting - Scoot to [place] of Bed: 4: Min guard Details for Bed Mobility Assistance: Incr time and effort with HOB flat.  Assist to support RLE and support trunk. Transfers Transfers: Sit to Stand;Stand to Sit Sit to Stand: 3: Mod assist;From bed;With upper extremity assist;From elevated surface Stand to Sit: 4: Min assist;To chair/3-in-1;With armrests;With upper extremity assist Details for Transfer Assistance: Pt demonstrating good recall of sequencing with min verbalcueing for safe hand placement.    Exercises      Balance     End of Session OT - End of Session Equipment Utilized During Treatment: Right knee immobilizer;Gait belt Activity Tolerance: Patient tolerated treatment well Patient left: in chair;with call bell/phone within reach Nurse Communication: Mobility status  GO   05/28/2012 Cipriano Mile OTR/L Pager [phone] Office [phone]    Cipriano Mile  05/28/2012, 8:32 AM

## 2012-05-28 NOTE — Progress Notes (Signed)
Physical Therapy Treatment Patient Details Name: Ryan Roach MRN: [id number] DOB: Aug 13, 1945 Today's Date: 05/28/2012 Time: 1027-1059 PT Time Calculation (min): 32 min  PT Assessment / Plan / Recommendation Comments on Treatment Session  Pt did very well with stair training and is ready for D/C.  States he will have enough help at home. Ryan Roach.     Follow Up Recommendations  Home health PT;Supervision for mobility/OOB     Does the patient have the potential to tolerate intense rehabilitation     Barriers to Discharge        Equipment Recommendations  Rolling walker with 5" wheels Ryan Roach RW)    Recommendations for Other Services    Frequency 7X/week   Plan Discharge plan remains appropriate    Precautions / Restrictions Precautions Precautions: Knee;Fall Required Braces or Orthoses: Knee Immobilizer - Right Knee Immobilizer - Right: Discontinue once straight leg raise with < 10 degree lag Restrictions Weight Bearing Restrictions: No Other Position/Activity Restrictions: WBAT   Pertinent Vitals/Pain     Mobility  Bed Mobility Bed Mobility: Sit to Supine Supine to Sit: 4: Min assist;HOB flat Sitting - Scoot to Edge of Bed: 4: Min guard Sit to Supine: 4: Min guard Details for Bed Mobility Assistance: Pt able to lift RLE into bed with min/guard for safety with cues for adjusting hips once in bed.  Transfers Transfers: Sit to Stand;Stand to Sit Sit to Stand: 4: Min assist;With upper extremity assist;With armrests;From chair/3-in-1 Stand to Sit: 4: Min guard;With upper extremity assist;To bed Details for Transfer Assistance: Continues to require min cues for hand placement when sitting. overall much improved.  Ambulation/Gait Ambulation/Gait Assistance: 4: Min assist Ambulation Distance (Feet): 65 Feet Assistive device: Rolling walker Ambulation/Gait Assistance Details: Very min cues for upright posture, however pt doing much better with sequencing/.  Gait Pattern:  Step-to pattern;Decreased stride length;Antalgic;Trunk flexed Gait velocity: decreased Stairs: Yes Stairs Assistance: 4: Min assist Stairs Assistance Details (indicate cue type and reason): cues for sequencing/technique with single step and RW.  Also educated pts son on how to assist pt.  Both return/verbalize understanding.  Stair Management Technique: No rails;Step to pattern;Backwards;Forwards;With walker Number of Stairs: 1    Exercises Total Joint Exercises Ankle Circles/Pumps: AROM;Both;20 reps Quad Sets: AROM;Right;10 reps Short Arc QuadBarbaraann Roach;Right;10 reps;Supine Heel Slides: AAROM;Right;10 reps Hip ABduction/ADduction: AAROM;Right;10 reps Straight Leg Raises: AAROM;Right;10 reps   PT Diagnosis:    PT Problem List:   PT Treatment Interventions:     PT Goals Acute Rehab PT Goals PT Goal Formulation: With patient Time For Goal Achievement: 06/02/12 Potential to Achieve Goals: Good Pt will go Supine/Side to Sit: with supervision PT Goal: Supine/Side to Sit - Progress: Progressing toward goal Pt will go Sit to Supine/Side: with supervision PT Goal: Sit to Supine/Side - Progress: Progressing toward goal Pt will go Sit to Stand: with supervision PT Goal: Sit to Stand - Progress: Progressing toward goal Pt will go Stand to Sit: with supervision PT Goal: Stand to Sit - Progress: Progressing toward goal Pt will Ambulate: 51 - 150 feet;with supervision;with least restrictive assistive device PT Goal: Ambulate - Progress: Progressing toward goal Pt will Go Up / Down Stairs: 1-2 stairs;with min assist;with least restrictive assistive device PT Goal: Up/Down Stairs - Progress: Met Pt will Perform Home Exercise Program: with supervision, verbal cues required/provided PT Goal: Perform Home Exercise Program - Progress: Met  Visit Information  Last PT Received On: 05/28/12 Assistance Needed: +1    Subjective Data  Subjective: I'm ready  to go home.  Patient Stated Goal: to return  home.    Cognition  Cognition Arousal/Alertness: Awake/alert Behavior During Therapy: WFL for tasks assessed/performed Overall Cognitive Status: Within Functional Limits for tasks assessed    Balance     End of Session PT - End of Session Equipment Utilized During Treatment: Right knee immobilizer Activity Tolerance: Patient tolerated treatment well;Patient limited by fatigue Patient left: in bed;with call bell/phone within reach;with family/visitor present Nurse Communication: Mobility status (ready for D/C and equipment needs)   GP     Ryan Roach 05/28/2012, 11:36 AM

## 2012-05-28 NOTE — Care Management Note (Signed)
   CARE MANAGEMENT NOTE 05/28/2012  Patient:  Deshpande,Grier   Account Number:  [id number]  Date Initiated:  05/28/2012  Documentation initiated by:  Raiford Noble  Subjective/Objective Assessment:   s/p knee arthoplasty     Action/Plan:   dc home with home health PT services   Anticipated DC Date:  05/28/2012   Anticipated DC Plan:  HOME W HOME HEALTH SERVICES  In-house referral  NA      DC Planning Services  CM consult      [facility] Choice  HOME HEALTH  DURABLE MEDICAL EQUIPMENT   Choice offered to / List presented to:  C-1 Patient   DME arranged  Levan Hurst      DME agency  [facility]     HH arranged  HH-2 PT      [facility] agency  [facility]   Status of service:  Completed, signed off Medicare Important Message given?   (If response is "NO", the following Medicare IM given date fields will be blank) Date Medicare IM given:   Date Additional Medicare IM given:    Discharge Disposition:  HOME W HOME HEALTH SERVICES  Per UR Regulation:    If discussed at Long Length of Stay Meetings, dates discussed:    Comments:  05/28/12 Reed Breech [phone] Joyce Gross with [phone] returned call and informed this writer that the [place] office with [facility] did not accept patient because patient was out of the area. Spoke with wife again to offer other choices for home health services. She asked what agency was in [place]- went over the list again with wife and she wanted this Clinical research associate to call [facility]. Called the on-call person for [facility] at [phone] ( had to ask for the on-call person). Spoke with Eunice Blase who states agency can accept patient and requested that orders and other information be faxed to [phone]. Fax confirmation received. Gave patient's wife this information and also gave her the number for the home health agency as [phone] per [facility] on-call. Made nursing aware  of all of the above. Reed Breech [phone]    05/28/12 ATIKAHALL,RNCM [phone] Received call from nursing staff stating patient will need bariatric walker and HHC PT arranged. Spoke with patient's wife who states the MD arranged home health services with [facility] prior to admission. Call made to [facility] oncall to confirm. Patient lives in [place], which will be served out of the [place] office. Will await call back from [place] whether or not patient has already been arranged with them. Of note, patient does have a walker at home, therefore, wife reports it is okay for walker to be delivered to the house.ATIKAHALL,RNCM [phone]

## 2012-05-29 NOTE — Progress Notes (Addendum)
05/29/2012 0920 NCM received call from [facility] and they cannot accept [employer]. Contacted pt at home and made aware. Gave permission to speak to wife. Explained [facility] cannot accept [employer]. Reviewed choices with pt and she agreeable to [facility]. Contacted [facility] and spoke to rep for HHPT. They will arrange for soc on 4/22.  Isidoro Donning RN CCM Case Mgmt phone [phone]

## 2015-05-13 ENCOUNTER — Inpatient Hospital Stay (HOSPITAL_COMMUNITY)
Admission: EM | Admit: 2015-05-13 | Discharge: 2015-05-19 | DRG: 436 | Disposition: A | Discharge status: Home Health | Payer: PPO | Attending: Internal Medicine | Admitting: Internal Medicine

## 2015-05-13 ENCOUNTER — Encounter (HOSPITAL_COMMUNITY): Payer: Self-pay

## 2015-05-13 DIAGNOSIS — K8061 Calculus of gallbladder and bile duct with cholecystitis, unspecified, with obstruction: Secondary | ICD-10-CM | POA: Diagnosis not present

## 2015-05-13 DIAGNOSIS — Z6841 Body Mass Index (BMI) 40.0 and over, adult: Secondary | ICD-10-CM | POA: Diagnosis not present

## 2015-05-13 DIAGNOSIS — S81809A Unspecified open wound, unspecified lower leg, initial encounter: Secondary | ICD-10-CM | POA: Diagnosis present

## 2015-05-13 DIAGNOSIS — E46 Unspecified protein-calorie malnutrition: Secondary | ICD-10-CM | POA: Diagnosis present

## 2015-05-13 DIAGNOSIS — K831 Obstruction of bile duct: Secondary | ICD-10-CM | POA: Diagnosis not present

## 2015-05-13 DIAGNOSIS — I2510 Atherosclerotic heart disease of native coronary artery without angina pectoris: Secondary | ICD-10-CM | POA: Diagnosis not present

## 2015-05-13 DIAGNOSIS — D689 Coagulation defect, unspecified: Secondary | ICD-10-CM | POA: Diagnosis present

## 2015-05-13 DIAGNOSIS — K7290 Hepatic failure, unspecified without coma: Secondary | ICD-10-CM | POA: Diagnosis not present

## 2015-05-13 DIAGNOSIS — N401 Enlarged prostate with lower urinary tract symptoms: Secondary | ICD-10-CM | POA: Diagnosis not present

## 2015-05-13 DIAGNOSIS — K838 Other specified diseases of biliary tract: Secondary | ICD-10-CM | POA: Diagnosis not present

## 2015-05-13 DIAGNOSIS — M1990 Unspecified osteoarthritis, unspecified site: Secondary | ICD-10-CM | POA: Diagnosis not present

## 2015-05-13 DIAGNOSIS — I864 Gastric varices: Secondary | ICD-10-CM

## 2015-05-13 DIAGNOSIS — R7401 Elevation of levels of liver transaminase levels: Secondary | ICD-10-CM | POA: Diagnosis present

## 2015-05-13 DIAGNOSIS — L299 Pruritus, unspecified: Secondary | ICD-10-CM | POA: Diagnosis present

## 2015-05-13 DIAGNOSIS — R740 Nonspecific elevation of levels of transaminase and lactic acid dehydrogenase [LDH]: Secondary | ICD-10-CM | POA: Diagnosis not present

## 2015-05-13 DIAGNOSIS — E876 Hypokalemia: Secondary | ICD-10-CM | POA: Diagnosis present

## 2015-05-13 DIAGNOSIS — N400 Enlarged prostate without lower urinary tract symptoms: Secondary | ICD-10-CM | POA: Diagnosis not present

## 2015-05-13 DIAGNOSIS — R9431 Abnormal electrocardiogram [ECG] [EKG]: Secondary | ICD-10-CM | POA: Diagnosis not present

## 2015-05-13 DIAGNOSIS — F17220 Nicotine dependence, chewing tobacco, uncomplicated: Secondary | ICD-10-CM | POA: Diagnosis present

## 2015-05-13 DIAGNOSIS — Z23 Encounter for immunization: Secondary | ICD-10-CM

## 2015-05-13 DIAGNOSIS — K8689 Other specified diseases of pancreas: Secondary | ICD-10-CM | POA: Diagnosis not present

## 2015-05-13 DIAGNOSIS — R10811 Right upper quadrant abdominal tenderness: Secondary | ICD-10-CM | POA: Diagnosis not present

## 2015-05-13 DIAGNOSIS — C259 Malignant neoplasm of pancreas, unspecified: Secondary | ICD-10-CM | POA: Diagnosis not present

## 2015-05-13 DIAGNOSIS — K8051 Calculus of bile duct without cholangitis or cholecystitis with obstruction: Secondary | ICD-10-CM | POA: Diagnosis present

## 2015-05-13 DIAGNOSIS — R17 Unspecified jaundice: Secondary | ICD-10-CM | POA: Diagnosis not present

## 2015-05-13 DIAGNOSIS — R5381 Other malaise: Secondary | ICD-10-CM | POA: Diagnosis present

## 2015-05-13 DIAGNOSIS — K8021 Calculus of gallbladder without cholecystitis with obstruction: Secondary | ICD-10-CM | POA: Diagnosis present

## 2015-05-13 DIAGNOSIS — Z7982 Long term (current) use of aspirin: Secondary | ICD-10-CM

## 2015-05-13 DIAGNOSIS — R109 Unspecified abdominal pain: Secondary | ICD-10-CM | POA: Diagnosis not present

## 2015-05-13 DIAGNOSIS — K869 Disease of pancreas, unspecified: Secondary | ICD-10-CM | POA: Diagnosis not present

## 2015-05-13 DIAGNOSIS — S01301A Unspecified open wound of right ear, initial encounter: Secondary | ICD-10-CM | POA: Diagnosis not present

## 2015-05-13 DIAGNOSIS — R918 Other nonspecific abnormal finding of lung field: Secondary | ICD-10-CM | POA: Diagnosis not present

## 2015-05-13 HISTORY — DX: Other specified diseases of pancreas: K86.89

## 2015-05-13 HISTORY — DX: Obstruction of bile duct: K83.1

## 2015-05-13 HISTORY — DX: Malignant (primary) neoplasm, unspecified: C80.1

## 2015-05-13 LAB — COMPREHENSIVE METABOLIC PANEL
ALK PHOS: 535 U/L — AB (ref 38–126)
ALT: 78 U/L — ABNORMAL HIGH (ref 17–63)
ANION GAP: 14 (ref 5–15)
AST: 152 U/L — ABNORMAL HIGH (ref 15–41)
Albumin: 2.5 g/dL — ABNORMAL LOW (ref 3.5–5.0)
BUN: 14 mg/dL (ref 6–20)
CALCIUM: 8.4 mg/dL — AB (ref 8.9–10.3)
CHLORIDE: 103 mmol/L (ref 101–111)
CO2: 21 mmol/L — AB (ref 22–32)
Creatinine, Ser: 0.67 mg/dL (ref 0.61–1.24)
GFR calc non Af Amer: >60 mL/min (ref >60)
Glucose, Bld: 111 mg/dL — ABNORMAL HIGH (ref 65–99)
Potassium: 2.7 mmol/L — CL (ref 3.5–5.1)
SODIUM: 138 mmol/L (ref 135–145)
Total Bilirubin: 14.7 mg/dL — ABNORMAL HIGH (ref 0.3–1.2)
Total Protein: 7.6 g/dL (ref 6.5–8.1)

## 2015-05-13 LAB — CBC
HCT: 39.2 % (ref 39.0–52.0)
HEMOGLOBIN: 12.9 g/dL — AB (ref 13.0–17.0)
MCH: 29.3 pg (ref 26.0–34.0)
MCHC: 32.9 g/dL (ref 30.0–36.0)
MCV: 88.9 fL (ref 78.0–100.0)
Platelets: 437 10*3/uL — ABNORMAL HIGH (ref 150–400)
RBC: 4.41 MIL/uL (ref 4.22–5.81)
RDW: 21.7 % — ABNORMAL HIGH (ref 11.5–15.5)
WBC: 8.3 10*3/uL (ref 4.0–10.5)

## 2015-05-13 LAB — URINALYSIS, ROUTINE W REFLEX MICROSCOPIC
Glucose, UA: NEGATIVE mg/dL
HGB URINE DIPSTICK: NEGATIVE
Ketones, ur: 15 mg/dL — AB
Nitrite: NEGATIVE
Protein, ur: NEGATIVE mg/dL
SPECIFIC GRAVITY, URINE: 1.024 (ref 1.005–1.030)
pH: 6.5 (ref 5.0–8.0)

## 2015-05-13 LAB — URINE MICROSCOPIC-ADD ON

## 2015-05-13 LAB — LIPASE, BLOOD: LIPASE: 35 U/L (ref 11–51)

## 2015-05-13 MED ORDER — POTASSIUM CHLORIDE 10 MEQ/100ML IV SOLN
10.0000 meq | Freq: Once | INTRAVENOUS | Status: AC
  Administered 2015-05-14: 10 meq via INTRAVENOUS
  Filled 2015-05-13: qty 100

## 2015-05-13 NOTE — ED Notes (Signed)
Onset 3 weeks RUQ abd tenderness, black urine (wife reports looks like cola), stool color/consistency changes (today stool is clay color, thick and pasty).  Pt skin color is yellow (head to bilateral LE).  Pt reports has been taking Tylenol 3000 mg every day X "years".  Pt has been taking ASA 325 mg x 2 tabs every day X 3 years.  Pt has a wound on right ear X several months.  Wife reports pt has dark, almost black looking skin to buttocks and back of thighs.  Wife also reports "red dots" on scrotum as well as on left lower leg, right arm and back.

## 2015-05-13 NOTE — ED Notes (Signed)
Charge RN made aware of critical K+

## 2015-05-13 NOTE — ED Provider Notes (Signed)
CSN: [id number]     Arrival date & time 05/13/15  2129 History  By signing my name below, I, Ryan Roach, attest that this documentation has been prepared under the direction and in the presence of Ryan Furry, MD. Electronically Signed: Altamease Roach, ED Scribe. 05/13/2015. 11:33 PM   Chief Complaint  Patient presents with  . Jaundice  . Abdominal Pain  . Hematuria   The history is provided by the patient. No language interpreter was used.   Ryan Roach is a 70 y.o. male who presents to the Emergency Department complaining of constant dark ("black") colored urine with onset 2-3 weeks ago. Associated symptoms include yellowing of the eyes and skin, pale stool, itching at the abdomen, a diffuse red rash, abdominal pain, and generalized weakness. He takes 3 tablets of Tylenol twice daily for chronic leg pain. Pt denies difficulty breathing and new LE swelling. He and his wife deny any recent confusion. No recent travel. No history of anemia, blood transfusion,  or IV drug use. Apart from the Tylenol he takes no daily medication.   Past Medical History  Diagnosis Date  . Arthritis    Past Surgical History  Procedure Laterality Date  . Tonsillectomy    . 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 No pertinent family history. Social History  Substance Use Topics  . Smoking status: Never Smoker   . Smokeless tobacco: Current User    Types: Chew  . Alcohol Use: No    Review of Systems  Constitutional: Negative for fever, chills, diaphoresis, appetite change and fatigue.  HENT: Negative for mouth sores, sore throat and trouble swallowing.   Eyes: Negative for visual disturbance.  Respiratory: Negative for cough, chest tightness, shortness of breath and wheezing.   Cardiovascular: Negative for chest pain.  Gastrointestinal:  Positive for abdominal pain. Negative for nausea, vomiting, diarrhea and abdominal distention.       Pale stool   Endocrine: Negative for polydipsia, polyphagia and polyuria.  Genitourinary: Negative for dysuria and frequency.       Dark colored urine   Musculoskeletal: Negative for gait problem.  Skin: Positive for color change and rash. Negative for pallor.  Neurological: Positive for weakness (generalized ). Negative for dizziness, syncope, light-headedness and headaches.  Hematological: Does not bruise/bleed easily.  Psychiatric/Behavioral: Negative for behavioral problems and confusion.      Allergies  Review of patient's allergies indicates no known allergies.  Home Medications   Prior to Admission medications   Medication Sig Start Date End Date Taking? Authorizing Provider  acetaminophen (TYLENOL) 500 MG tablet Take 500 mg by mouth 3 (three) times daily. Take every day per patient   Yes Historical Provider, MD  aspirin 325 MG tablet Take 325 mg by mouth daily.   Yes Historical Provider, MD  ferrous sulfate 325 (65 FE) MG tablet Take 1 tablet (325 mg total) by mouth 3 (three) times daily after meals. Patient not taking: Reported on 05/13/2015 05/26/12   Latanya Maudlin, MD  methocarbamol (ROBAXIN) 500 MG tablet Take 1 tablet (500 mg total) by mouth every 6 (six) hours as needed. Patient not taking: Reported on 05/13/2015 05/25/12   Latanya Maudlin, MD  oxyCODONE-acetaminophen (PERCOCET/ROXICET) 5-325 MG per tablet Take 1-2 tablets by mouth every 4 (four) hours as needed. Patient not taking: Reported on 05/13/2015 05/25/12   Latanya Maudlin, MD  polyethylene glycol (MIRALAX / GLYCOLAX) packet Take 17 g by mouth daily as needed. Patient not taking: Reported on 05/13/2015 05/25/12   Latanya Maudlin, MD  rivaroxaban (XARELTO) 10 MG TABS tablet Take 1 tablet (10 mg total) by mouth daily with breakfast. Patient not taking: Reported on 05/13/2015 05/26/12   Latanya Maudlin, MD   BP 139/67 mmHg  Pulse  96  Temp(Src) 98.1 F (36.7 C) (Oral)  Resp 21  SpO2 100% Physical Exam  Constitutional: He is oriented to person, place, and time. He appears well-developed and well-nourished. No distress.  HENT:  Head: Normocephalic.  Eyes: Conjunctivae are normal. Pupils are equal, round, and reactive to light. Scleral icterus is present.  Scleral jaundice Clotted dried blood on the tragus of the right ear   Neck: Normal range of motion. Neck supple. No thyromegaly present.  Cardiovascular: Regular rhythm.  Tachycardia present.  Exam reveals no gallop and no friction rub.   No murmur heard. Pulmonary/Chest: Effort normal and breath sounds normal. No respiratory distress. He has no wheezes. He has no rales.  Abdominal: Soft. Bowel sounds are normal. He exhibits no distension. There is generalized tenderness. There is no rebound.  Musculoskeletal: Normal range of motion. He exhibits edema.  1+ edmea at BLEs   Neurological: He is alert and oriented to person, place, and time.  Skin: Skin is warm and dry. Rash noted.  Diffuse red rash consistent with angioma Diffuse jaundice   Psychiatric: He has a normal mood and affect. His behavior is normal.    ED Course  Procedures (including critical care time) DIAGNOSTIC STUDIES: Oxygen Saturation is 100% on RA,  normal by my interpretation.    COORDINATION OF CARE: 11:27 PM Discussed treatment plan which includes lab work with pt at bedside and pt agreed to plan.  Labs Review Labs Reviewed  COMPREHENSIVE METABOLIC PANEL - Abnormal; Notable for the following:    Potassium 2.7 (*)    CO2 21 (*)    Glucose, Bld 111 (*)    Calcium 8.4 (*)    Albumin 2.5 (*)    AST 152 (*)    ALT 78 (*)    Alkaline Phosphatase 535 (*)    Total Bilirubin 14.7 (*)    All other components within normal limits  CBC - Abnormal; Notable for the following:    Hemoglobin 12.9 (*)    RDW 21.7 (*)    Platelets 437 (*)    All other components within normal limits   URINALYSIS, ROUTINE W REFLEX MICROSCOPIC (NOT AT [facility]) - Abnormal; Notable for the following:    Color, Urine ORANGE (*)    APPearance CLOUDY (*)    Bilirubin Urine LARGE (*)    Ketones, ur 15 (*)    Leukocytes, UA SMALL (*)    All other components within normal limits  URINE MICROSCOPIC-ADD ON - Abnormal; Notable for the following:    Squamous Epithelial / LPF 0-5 (*)    Bacteria, UA RARE (*)    All other components within normal limits  PROTIME-INR - Abnormal; Notable for the following:    Prothrombin Time 21.0 (*)    INR 1.82 (*)    All other components within normal limits  AMMONIA - Abnormal; Notable for the following:    Ammonia 48 (*)    All other components within normal limits  ACETAMINOPHEN LEVEL - Abnormal; Notable for the following:    Acetaminophen (Tylenol), Serum <10 (*)    All other components within normal limits  LIPASE,  BLOOD  HEPATITIS PANEL, ACUTE    Imaging Review US Abdomen Complete  05/14/2015  CLINICAL DATA:  Jaundice for 3 days, question obstruction. Abdominal discomfort. EXAM: ABDOMEN ULTRASOUND COMPLETE COMPARISON:  None. FINDINGS: Gallbladder: Mildly distended with a small gallstone. No wall thickening visualized. No sonographic Murphy sign noted by sonographer. Common bile duct: Diameter: 11 mm at the porta hepatis. Not visualized distally. Liver: Diffuse increased and heterogeneous in echogenicity. Liver parenchyma is difficult to penetrate. No gross focal lesion. Normal directional flow in the main portal vein. There is central intrahepatic biliary ductal dilatation. IVC: No abnormality visualized. Pancreas: Not well visualized due to body habitus and bowel gas. Spleen: Size and appearance within normal limits were visualized. Right Kidney: Length: 13.4 cm. Echogenicity within normal limits. No mass or hydronephrosis visualized. Left Kidney: Length: 12.6 cm. Echogenicity within normal limits. No mass or hydronephrosis visualized. Abdominal aorta: Majority  obscured due to bowel gas and habitus. No aneurysm visualized proximally, mid and distal obscured. Other findings: None. Technically challenging exam due to body habitus and bowel gas. IMPRESSION: 1. Intra and extrahepatic biliary ductal dilatation. Mild gallbladder distention. Findings concerning for distal obstruction, etiology not visualized sonographically. Further evaluation is warranted with CT or MRCP. 2. Increased hepatic echogenicity, likely secondary to steatosis, liver parenchyma difficult to penetrate. 3. Limited assessment of midline structures. Electronically Signed   By: Jeb Levering M.D.   On: 05/14/2015 01:45   Ct Abdomen Pelvis W Contrast  05/14/2015  CLINICAL DATA:  Right upper quadrant abdominal tenderness in jaundice. Biliary dilatation on ultrasound. EXAM: CT ABDOMEN AND PELVIS WITH CONTRAST TECHNIQUE: Multidetector CT imaging of the abdomen and pelvis was performed using the standard protocol following bolus administration of intravenous contrast. CONTRAST:  144mL ISOVUE-300 IOPAMIDOL (ISOVUE-300) INJECTION 61% COMPARISON:  Abdominal ultrasound earlier this day. FINDINGS: Lower chest: Heart at the upper limits of normal in size. Coronary artery calcifications. No pleural effusion. No consolidation. Liver: Enlarged of decreased density. No focal lesion. Mild motion artifact partially obscures inferior liver. Hepatobiliary: Intra and extrahepatic biliary ductal dilatation. Gallbladder is distended. Mild gallbladder wall thickening. Common bile duct measures up to 1.7 cm in the midportion. No calcified choledocholithiasis. Mild motion artifact. Pancreas: Questionable hypodense lesion in the head measuring 2 cm. No pancreatic ductal dilatation. No peripancreatic inflammation. Mild motion artifact. Spleen: Normal. Adrenal glands: No nodule. Kidneys: Symmetric renal enhancement and excretion. No hydronephrosis. Stomach/Bowel: Stomach physiologically distended. There are no dilated or thickened  small bowel loops. Moderate volume of stool throughout the colon without colonic wall thickening. There is colonic tortuosity. The appendix is normal. Vascular/Lymphatic: No retroperitoneal adenopathy. Abdominal aorta is normal in caliber. Moderate atherosclerosis without aneurysm. Reproductive: Enlarged prostate gland measuring 6.8 cm transverse. Small bilateral external iliac nodes, all subcentimeter in size. Bladder: Physiologically distended, no wall thickening. Other: No free air, free fluid, or intra-abdominal fluid collection. Fat containing umbilical hernia. Musculoskeletal: There are no acute or suspicious osseous abnormalities. Multilevel degenerative change throughout the spine. Degenerative change of both hips. IMPRESSION: 1. 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 This is incompletely characterized. MRCP recommended if patient is able to tolerate breath hold technique. 2. Incidental finding of prostatomegaly. Atherosclerosis including coronary artery calcifications. Electronically Signed   By: Jeb Levering M.D.   On: 05/14/2015 05:59   I have personally reviewed and evaluated these images and lab results as part of my medical decision-making.   EKG Interpretation None      MDM  Final diagnoses:  Pancreatic mass  Obstructive jaundice     I personally performed the services described in this documentation, which was scribed in my presence. The recorded information has been reviewed and is accurate.  Elevation of alkaline phosphatase, and bilirubin, greater than transaminases.  Would suggest obstructive jaundice, as would his course of several weeks of painless jaundice. INR 1.8. Ammonia 48. He continues to [employer] well. Acetaminophen undetectable. K-Lor 2.7. His given IV potassium 3. Ultrasound shows biliary tract obstruction. 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 This was discussed  with the patient his wife at length. Will be admitted to [facility].   Ryan Furry, MD 05/14/15 [phone]

## 2015-05-14 ENCOUNTER — Encounter (HOSPITAL_COMMUNITY): Payer: Self-pay | Admitting: Radiology

## 2015-05-14 ENCOUNTER — Inpatient Hospital Stay (HOSPITAL_COMMUNITY): Payer: PPO

## 2015-05-14 ENCOUNTER — Emergency Department (HOSPITAL_COMMUNITY): Payer: PPO

## 2015-05-14 DIAGNOSIS — K7290 Hepatic failure, unspecified without coma: Secondary | ICD-10-CM | POA: Diagnosis not present

## 2015-05-14 DIAGNOSIS — F17220 Nicotine dependence, chewing tobacco, uncomplicated: Secondary | ICD-10-CM | POA: Diagnosis not present

## 2015-05-14 DIAGNOSIS — Z7982 Long term (current) use of aspirin: Secondary | ICD-10-CM | POA: Diagnosis not present

## 2015-05-14 DIAGNOSIS — Z23 Encounter for immunization: Secondary | ICD-10-CM | POA: Diagnosis not present

## 2015-05-14 DIAGNOSIS — R9431 Abnormal electrocardiogram [ECG] [EKG]: Secondary | ICD-10-CM | POA: Diagnosis not present

## 2015-05-14 DIAGNOSIS — S81809A Unspecified open wound, unspecified lower leg, initial encounter: Secondary | ICD-10-CM | POA: Diagnosis present

## 2015-05-14 DIAGNOSIS — C801 Malignant (primary) neoplasm, unspecified: Secondary | ICD-10-CM

## 2015-05-14 DIAGNOSIS — R5381 Other malaise: Secondary | ICD-10-CM | POA: Diagnosis not present

## 2015-05-14 DIAGNOSIS — R17 Unspecified jaundice: Secondary | ICD-10-CM | POA: Diagnosis not present

## 2015-05-14 DIAGNOSIS — E876 Hypokalemia: Secondary | ICD-10-CM | POA: Diagnosis not present

## 2015-05-14 DIAGNOSIS — K8051 Calculus of bile duct without cholangitis or cholecystitis with obstruction: Secondary | ICD-10-CM | POA: Diagnosis not present

## 2015-05-14 DIAGNOSIS — K8021 Calculus of gallbladder without cholecystitis with obstruction: Secondary | ICD-10-CM | POA: Diagnosis not present

## 2015-05-14 DIAGNOSIS — R10811 Right upper quadrant abdominal tenderness: Secondary | ICD-10-CM | POA: Diagnosis not present

## 2015-05-14 DIAGNOSIS — E46 Unspecified protein-calorie malnutrition: Secondary | ICD-10-CM | POA: Diagnosis not present

## 2015-05-14 DIAGNOSIS — R740 Nonspecific elevation of levels of transaminase and lactic acid dehydrogenase [LDH]: Secondary | ICD-10-CM | POA: Diagnosis not present

## 2015-05-14 DIAGNOSIS — K8061 Calculus of gallbladder and bile duct with cholecystitis, unspecified, with obstruction: Secondary | ICD-10-CM | POA: Diagnosis not present

## 2015-05-14 DIAGNOSIS — R109 Unspecified abdominal pain: Secondary | ICD-10-CM | POA: Diagnosis not present

## 2015-05-14 DIAGNOSIS — K831 Obstruction of bile duct: Secondary | ICD-10-CM

## 2015-05-14 DIAGNOSIS — N401 Enlarged prostate with lower urinary tract symptoms: Secondary | ICD-10-CM | POA: Diagnosis not present

## 2015-05-14 DIAGNOSIS — S81801A Unspecified open wound, right lower leg, initial encounter: Secondary | ICD-10-CM

## 2015-05-14 DIAGNOSIS — K838 Other specified diseases of biliary tract: Secondary | ICD-10-CM | POA: Diagnosis not present

## 2015-05-14 DIAGNOSIS — R918 Other nonspecific abnormal finding of lung field: Secondary | ICD-10-CM | POA: Diagnosis not present

## 2015-05-14 DIAGNOSIS — L299 Pruritus, unspecified: Secondary | ICD-10-CM | POA: Diagnosis not present

## 2015-05-14 DIAGNOSIS — M1990 Unspecified osteoarthritis, unspecified site: Secondary | ICD-10-CM | POA: Diagnosis not present

## 2015-05-14 DIAGNOSIS — N400 Enlarged prostate without lower urinary tract symptoms: Secondary | ICD-10-CM | POA: Diagnosis present

## 2015-05-14 DIAGNOSIS — D689 Coagulation defect, unspecified: Secondary | ICD-10-CM | POA: Diagnosis not present

## 2015-05-14 DIAGNOSIS — C259 Malignant neoplasm of pancreas, unspecified: Secondary | ICD-10-CM | POA: Diagnosis not present

## 2015-05-14 DIAGNOSIS — Z6841 Body Mass Index (BMI) 40.0 and over, adult: Secondary | ICD-10-CM | POA: Diagnosis not present

## 2015-05-14 DIAGNOSIS — I2510 Atherosclerotic heart disease of native coronary artery without angina pectoris: Secondary | ICD-10-CM | POA: Diagnosis not present

## 2015-05-14 DIAGNOSIS — S01301A Unspecified open wound of right ear, initial encounter: Secondary | ICD-10-CM | POA: Diagnosis not present

## 2015-05-14 DIAGNOSIS — K869 Disease of pancreas, unspecified: Secondary | ICD-10-CM

## 2015-05-14 DIAGNOSIS — K8689 Other specified diseases of pancreas: Secondary | ICD-10-CM

## 2015-05-14 HISTORY — DX: Other specified diseases of pancreas: K86.89

## 2015-05-14 HISTORY — DX: Malignant (primary) neoplasm, unspecified: C80.1

## 2015-05-14 LAB — ABO/RH: ABO/RH(D): A NEG

## 2015-05-14 LAB — TYPE AND SCREEN
ABO/RH(D): A NEG
Antibody Screen: NEGATIVE

## 2015-05-14 LAB — MAGNESIUM: MAGNESIUM: 1.9 mg/dL (ref 1.7–2.4)

## 2015-05-14 LAB — PROTIME-INR
INR: 1.82 — AB (ref 0.00–1.49)
Prothrombin Time: 21 seconds — ABNORMAL HIGH (ref 11.6–15.2)

## 2015-05-14 LAB — PSA: PSA: 5.98 ng/mL — AB (ref 0.00–4.00)

## 2015-05-14 LAB — AMMONIA: AMMONIA: 48 umol/L — AB (ref 9–35)

## 2015-05-14 LAB — LACTATE DEHYDROGENASE: LDH: 395 U/L — AB (ref 98–192)

## 2015-05-14 LAB — ACETAMINOPHEN LEVEL

## 2015-05-14 MED ORDER — IOPAMIDOL (ISOVUE-300) INJECTION 61%
INTRAVENOUS | Status: AC
  Administered 2015-05-14: 75 mL
  Filled 2015-05-14: qty 75

## 2015-05-14 MED ORDER — POTASSIUM CHLORIDE CRYS ER 20 MEQ PO TBCR
20.0000 meq | EXTENDED_RELEASE_TABLET | Freq: Every day | ORAL | Status: DC
  Administered 2015-05-14 – 2015-05-19 (×6): 20 meq via ORAL
  Filled 2015-05-14 (×6): qty 1

## 2015-05-14 MED ORDER — LACTULOSE 10 GM/15ML PO SOLN
20.0000 g | Freq: Three times a day (TID) | ORAL | Status: DC
  Administered 2015-05-14 – 2015-05-16 (×7): 20 g via ORAL
  Filled 2015-05-14 (×10): qty 30

## 2015-05-14 MED ORDER — POTASSIUM CHLORIDE 10 MEQ/100ML IV SOLN
10.0000 meq | INTRAVENOUS | Status: DC
  Administered 2015-05-14: 10 meq via INTRAVENOUS
  Filled 2015-05-14: qty 100

## 2015-05-14 MED ORDER — PNEUMOCOCCAL VAC POLYVALENT 25 MCG/0.5ML IJ INJ
0.5000 mL | INJECTION | INTRAMUSCULAR | Status: AC
  Administered 2015-05-15: 0.5 mL via INTRAMUSCULAR
  Filled 2015-05-14: qty 0.5

## 2015-05-14 MED ORDER — ACETAMINOPHEN 325 MG PO TABS: 650.0000 mg | ORAL_TABLET | Freq: Four times a day (QID) | ORAL | Status: DC | PRN

## 2015-05-14 MED ORDER — VITAMIN K1 10 MG/ML IJ SOLN
10.0000 mg | Freq: Once | INTRAMUSCULAR | Status: AC
Start: 2015-05-14 — End: 2015-05-14
  Administered 2015-05-14: 10 mg via SUBCUTANEOUS
  Filled 2015-05-14: qty 1

## 2015-05-14 MED ORDER — ONDANSETRON HCL 4 MG PO TABS: 4.0000 mg | ORAL_TABLET | Freq: Four times a day (QID) | ORAL | Status: DC | PRN

## 2015-05-14 MED ORDER — SODIUM CHLORIDE 0.9 % IV SOLN
INTRAVENOUS | Status: DC
  Administered 2015-05-14: 100 mL/h via INTRAVENOUS
  Administered 2015-05-15: 06:00:00 via INTRAVENOUS

## 2015-05-14 MED ORDER — ONDANSETRON HCL 4 MG/2ML IJ SOLN: 4.0000 mg | Freq: Four times a day (QID) | INTRAMUSCULAR | Status: DC | PRN

## 2015-05-14 MED ORDER — POTASSIUM CHLORIDE 10 MEQ/100ML IV SOLN
10.0000 meq | Freq: Once | INTRAVENOUS | Status: AC
  Administered 2015-05-14: 10 meq via INTRAVENOUS
  Filled 2015-05-14: qty 100

## 2015-05-14 MED ORDER — HYDROCODONE-ACETAMINOPHEN 5-325 MG PO TABS
1.0000 | ORAL_TABLET | ORAL | Status: DC | PRN
  Administered 2015-05-16 – 2015-05-17 (×2): 1 via ORAL
  Filled 2015-05-14 (×2): qty 1

## 2015-05-14 MED ORDER — TRAZODONE HCL 50 MG PO TABS: 25.0000 mg | ORAL_TABLET | Freq: Every evening | ORAL | Status: DC | PRN

## 2015-05-14 MED ORDER — MORPHINE SULFATE (PF) 2 MG/ML IV SOLN: 1.0000 mg | INTRAVENOUS | Status: DC | PRN

## 2015-05-14 MED ORDER — ACETAMINOPHEN 650 MG RE SUPP: 650.0000 mg | Freq: Four times a day (QID) | RECTAL | Status: DC | PRN

## 2015-05-14 MED ORDER — IOPAMIDOL (ISOVUE-300) INJECTION 61%
INTRAVENOUS | Status: AC
  Administered 2015-05-14: 100 mL
  Filled 2015-05-14: qty 100

## 2015-05-14 MED ORDER — DIPHENHYDRAMINE HCL 50 MG/ML IJ SOLN
12.5000 mg | Freq: Once | INTRAMUSCULAR | Status: AC
  Administered 2015-05-14: 12.5 mg via INTRAVENOUS
  Filled 2015-05-14: qty 1

## 2015-05-14 NOTE — Progress Notes (Signed)
Pt signed consent for ercp tomorrow

## 2015-05-14 NOTE — H&P (Signed)
[facility] History and Physical  Ryan Roach [id number] DOB: [birth year] DOA: 05/13/2015  Referring physician: EDP PCP: No primary care provider on file.   Chief Complaint: Abdominal Pain Jaundice  HPI: Ryan Roach is a 70 y.o. male with no significant prior medical history, presenting to the ED with 3 week history of dark colored urine, painless jaundice, light stools, generalized pruritic rash, epigastric pain and generalized weakness. In addition, he noticed failure to thrive since early March, which time it was believed to be due to a "bug". Since that time, symptoms have worsened. Denies fevers, chills, night sweats, vision changes, or mucositis. Denies any respiratory complaints. Denies any chest pain or palpitations. Denies lower extremity swelling. Denies nausea or heartburn. Denies any dysuria. No confusion is reported. Patient does not consume alcohol. He does chew tobacco. No recreational drugs. Of note, he was to be seen on 4/11 as outpatient with a primary physician but due to these symptoms, the patient could not wait any longer.He denies any prior history or family history of cancer. Denies history of DM  Workup at the ED included Abdominal US which showed intra-and extrahepatic BD dilatation,concerning for distal obstruction; increased hepatic echogenicity noted.  CT of the abdomen and pelvis, revealing a 2 cm pancreatic head mass, incompletely characterized, with intra-and extrahepatic biliary ductal dilatation. No  retroperitoneal adenopathy. Liver is enlarged, but without focal lesion. Incidentally, a 6.8 cm enlarged prostate was seen. Total bilirubin was 14.7, alkaline phosphatase 535, AST and ALT are 152/78 respectively, albumin 2.5. K 2.7.  Platelets normal at 437. Hemoglobin 12.9. UA is remarkable for large bilirubin, PT 21-INR 1.82. Ammonia 48. Suspecting pancreatic primary cancer, the patient will be admitted for further workup.  Review of Systems:  See HPI for  significant positives. All other systems were reviewed and are negative.  Past Medical History  Diagnosis Date  . Arthritis    Past Surgical History  Procedure Laterality Date  . Tonsillectomy    . Total knee arthroplasty Right 05/25/2012    Procedure: RIGHT TOTAL KNEE ARTHROPLASTY;  Surgeon: Tobi Bastos, MD;  Location: [facility];  Service: Orthopedics;  Laterality: Right;   Social History:  reports that he has never smoked. His smokeless tobacco use includes Chew. He reports that he does not drink alcohol or use illicit drugs.  No Known Allergies  Family History  Problem Relation Age of Onset  . Breast cancer Mother      Prior to Admission medications   Medication Sig Start Date End Date Taking? Authorizing Provider  acetaminophen (TYLENOL) 500 MG tablet Take 500 mg by mouth 3 (three) times daily. Take every day per patient   Yes Historical Provider, MD  aspirin 325 MG tablet Take 325 mg by mouth daily.   Yes Historical Provider, MD  ferrous sulfate 325 (65 FE) MG tablet Take 1 tablet (325 mg total) by mouth 3 (three) times daily after meals. Patient not taking: Reported on 05/13/2015 05/26/12   Latanya Maudlin, MD  methocarbamol (ROBAXIN) 500 MG tablet Take 1 tablet (500 mg total) by mouth every 6 (six) hours as needed. Patient not taking: Reported on 05/13/2015 05/25/12   Latanya Maudlin, MD  oxyCODONE-acetaminophen (PERCOCET/ROXICET) 5-325 MG per tablet Take 1-2 tablets by mouth every 4 (four) hours as needed. Patient not taking: Reported on 05/13/2015 05/25/12   Latanya Maudlin, MD  polyethylene glycol [facility] Floria Raveling) packet Take 17 g by mouth daily as needed. Patient not taking: Reported on 05/13/2015 05/25/12   Latanya Maudlin, MD  rivaroxaban (XARELTO) 10 MG TABS tablet Take 1 tablet (10 mg total) by mouth daily with breakfast. Patient not taking: Reported on 05/13/2015 05/26/12   Latanya Maudlin, MD   Physical Exam: Filed Vitals:   05/14/15 0530 05/14/15 0600 05/14/15 0630 05/14/15  0730  BP: 113/49 119/55 135/67 128/56  Pulse: 98 92 99 97  Temp:      TempSrc:      Resp:      SpO2: 98% 97% 97% 98%    Wt Readings from Last 3 Encounters:  05/26/12 146.4 kg (322 lb 12.1 oz)  05/19/12 143.79 kg (317 lb)    General: Ill appearing, alert and conversant Eyes:  PERRL, EOMI, normal lids, iris.scleral icterus noted.  ENT: grossly normal hearing, lips & tongue. Right dry eschar on the trigus of the right  ear. Neck: no lymphadenopathy, masses or thyromegaly Cardiovascular:  Tachy regular rate and rythm, no murmurs, rubs or gallops. Trace biklateral lower extremity edema   Respiratory: clear to auscultation bilaterally, no wheezing, rhonhci or rales. Normal respiratory effort. Abdomen: obese, soft, soft, generalized tenderness worse at the epigastrium, normal bowel sounds Skin: + angiomatous rash seen on limited exam. Jaundiced.  Musculoskeletal:  grossly normal tone in both upper and lower extremities Psychiatric: grossly normal mood and affect, speech fluent and appropriate Neurologic: CN 2-12 grossly intact, moves all extremities in coordinated fashion.          Labs on Admission:  Basic Metabolic Panel:  Recent Labs Lab 05/13/15 2201  NA 138  K 2.7*  CL 103  CO2 21*  GLUCOSE 111*  BUN 14  CREATININE 0.67  CALCIUM 8.4*    Liver Function Tests:  Recent Labs Lab 05/13/15 2201  AST 152*  ALT 78*  ALKPHOS 535*  BILITOT 14.7*  PROT 7.6  ALBUMIN 2.5*    Recent Labs Lab 05/13/15 2201  LIPASE 35    Recent Labs Lab 05/14/15 0138  AMMONIA 48*    CBC:  Recent Labs Lab 05/13/15 2201  WBC 8.3  HGB 12.9*  HCT 39.2  MCV 88.9  PLT 437*    Radiological Exams on Admission: US Abdomen Complete  05/14/2015  CLINICAL DATA:  Jaundice for 3 days, question obstruction. Abdominal discomfort. EXAM: ABDOMEN ULTRASOUND COMPLETE COMPARISON:  None. FINDINGS: Gallbladder: Mildly distended with a small gallstone. No wall thickening visualized. No  sonographic Murphy sign noted by sonographer. Common bile duct: Diameter: 11 mm at the porta hepatis. Not visualized distally. Liver: Diffuse increased and heterogeneous in echogenicity. Liver parenchyma is difficult to penetrate. No gross focal lesion. Normal directional flow in the main portal vein. There is central intrahepatic biliary ductal dilatation. IVC: No abnormality visualized. Pancreas: Not well visualized due to body habitus and bowel gas. Spleen: Size and appearance within normal limits were visualized. Right Kidney: Length: 13.4 cm. Echogenicity within normal limits. No mass or hydronephrosis visualized. Left Kidney: Length: 12.6 cm. Echogenicity within normal limits. No mass or hydronephrosis visualized. Abdominal aorta: Majority obscured due to bowel gas and habitus. No aneurysm visualized proximally, mid and distal obscured. Other findings: None. Technically challenging exam due to body habitus and bowel gas. IMPRESSION: 1. Intra and extrahepatic biliary ductal dilatation. Mild gallbladder distention. Findings concerning for distal obstruction, etiology not visualized sonographically. Further evaluation is warranted with CT or MRCP. 2. Increased hepatic echogenicity, likely secondary to steatosis, liver parenchyma difficult to penetrate. 3. Limited assessment of midline structures. Electronically Signed   By: Jeb Levering M.D.   On: 05/14/2015 01:45  Ct Abdomen Pelvis W Contrast  05/14/2015  CLINICAL DATA:  Right upper quadrant abdominal tenderness in jaundice. Biliary dilatation on ultrasound. EXAM: CT ABDOMEN AND PELVIS WITH CONTRAST TECHNIQUE: Multidetector CT imaging of the abdomen and pelvis was performed using the standard protocol following bolus administration of intravenous contrast. CONTRAST:  154mL ISOVUE-300 IOPAMIDOL (ISOVUE-300) INJECTION 61% COMPARISON:  Abdominal ultrasound earlier this day. FINDINGS: Lower chest: Heart at the upper limits of normal in size. Coronary artery  calcifications. No pleural effusion. No consolidation. Liver: Enlarged of decreased density. No focal lesion. Mild motion artifact partially obscures inferior liver. Hepatobiliary: Intra and extrahepatic biliary ductal dilatation. Gallbladder is distended. Mild gallbladder wall thickening. Common bile duct measures up to 1.7 cm in the midportion. No calcified choledocholithiasis. Mild motion artifact. Pancreas: Questionable hypodense lesion in the head measuring 2 cm. No pancreatic ductal dilatation. No peripancreatic inflammation. Mild motion artifact. Spleen: Normal. Adrenal glands: No nodule. Kidneys: Symmetric renal enhancement and excretion. No hydronephrosis. Stomach/Bowel: Stomach physiologically distended. There are no dilated or thickened small bowel loops. Moderate volume of stool throughout the colon without colonic wall thickening. There is colonic tortuosity. The appendix is normal. Vascular/Lymphatic: No retroperitoneal adenopathy. Abdominal aorta is normal in caliber. Moderate atherosclerosis without aneurysm. Reproductive: Enlarged prostate gland measuring 6.8 cm transverse. Small bilateral external iliac nodes, all subcentimeter in size. Bladder: Physiologically distended, no wall thickening. Other: No free air, free fluid, or intra-abdominal fluid collection. Fat containing umbilical hernia. Musculoskeletal: There are no acute or suspicious osseous abnormalities. Multilevel degenerative change throughout the spine. Degenerative change of both hips. IMPRESSION: 1. 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 This is incompletely characterized. MRCP recommended if patient is able to tolerate breath hold technique. 2. Incidental finding of prostatomegaly. Atherosclerosis including coronary artery calcifications. Electronically Signed   By: Jeb Levering M.D.   On: 05/14/2015 05:59    EKG: Independently reviewed.    Assessment/Plan Active  Problems:   Pancreatic cancer (HCC)   Obstructive jaundice due to cancer   Hypokalemia   Enlarged prostate   Non-healing wound or right  ear   Malnutrition (HCC)   Physical deconditioning   Abnormal EKG   Hyperbilirubinemia   Pancreatic Mass with obstructive jaundice, new finding, likely pancreatic primary. Patient c/o abdominal pain, generalized rash, FTT, dark colored urine and light colored stools. Abdominal US w intra-and extrahepatic BD dilatation, concerning for distal obstruction; increased hepatic echogenicity noted.  CT of the A/P, with 2 cm pancreatic head mass, incompletely characterized, with intra-and extrahepatic biliary ductal dilatation. No retroperitoneal adenopathy. Liver enlarged, but without focal lesion. Total bilirubin was 14.7 UA with large bilirubin. Ammonia 48. Autoanticoagulating with  PT/INR 21/1.82 Other labs including Hep Panel, Lipase pending Suspecting pancreatic primary cancer, the patient will be admitted for further workup. Check CA19.9, LDH, CEA  CT chest to rule out metastatic disease May need surgical consult for possible resection and tissue diagnosis GI consult called for possible MRCP vs. EUS in the setting of Jaundice due to CBD dilatation Supportive therapy including pain control, pruritic symptoms IVF  Incidental Enlarged prostate. CT A/P showed a 6.8 cm enlarged prostate Check PSA  Non-healing  Right ear wound present for several months Wound care while in hospital   Malnutrition and deconditioning  Secondary to #1  Nutrition evaluation/ PT/OT  Hypokalemia, due to loose stools. Current K 2.7   Received 10 meq IVx1 at the ER Oral replenishment Check Mg     Hyperbilirubinemia Total Bili 14.7  Secondary to biliary obstruction due to #1  Hep panel pending  IVF   Deconditioning  PT/OT/Nutrition Evaluation  Abnormal EKG with questionable RBBB QTC 461. Last EKG in 2014 with cardiomegaly. PAtient asymptomatic Repeat EKG 2 D echo      Code Status: Full Code  DVT Prophylaxis:  SCDs for now anticipating possible diagnostic procedures Family Communication:  Family at bedside Disposition Plan: Pending Improvement. Admitted to Roann E,PA-C [facility] [email or web address] Password [email or web address]

## 2015-05-14 NOTE — ED Notes (Signed)
Dr. Jeneen Rinks notified of pt's K+

## 2015-05-14 NOTE — Consult Note (Addendum)
WOC wound consult note Reason for Consult: Consult requested for right ear.  Family at bedside; pt states this lesion has been present for "about a year, and sometimes I take the scab off but then it bleeds a lot and comes back." Wound type: Inner ear with black lesion; approx .8X.8cm, irregular shaped raised growth Drainage (amount, consistency, odor) Currently no odor or drainage Periwound: Intact skin surrounding Dressing procedure/placement/frequency: This is not a wound and topical treatment will not be effective; appearance and length of site indicate possible melanoma. Discussed with patient and family members the importance of follow-up with a dermatologist as soon as he is discharged from the hospital for a biopsy and further plan of care.  They verbalize understanding and deny further questions. Please re-consult if further assistance is needed.  Thank-you,  Julien Girt MSN, [place], [place], [place], [place]

## 2015-05-14 NOTE — Plan of Care (Signed)
70 year old male presents with painless jaundice. CT scan shows pancreatic mass. Patient admitted for further management.  Ryan Roach.

## 2015-05-14 NOTE — Clinical Documentation Improvement (Signed)
Internal Medicine  Can the diagnosis of Malnutrition be further specified by severity? Thank you   Document Severity - Severe(third degree), Moderate (second degree), Mild (first degree)  Other condition  Unable to clinically determine     Supporting Information:    Pancreatic cancer , hypokalemia   Please exercise your independent, professional judgment when responding. A specific answer is not anticipated or expected.   Thank You, [place] [phone]

## 2015-05-14 NOTE — Progress Notes (Signed)
Pt admitted from ED this evening and is scheduled for an ERCP tomorrow.alert, oriented and able to voice needs. Denies pain. Family at bedside,no concerns voiced

## 2015-05-14 NOTE — Consult Note (Signed)
[facility] Consult: 9:02 AM 05/14/2015  LOS: 0 days    Referring Provider: Dr Marily Memos  Primary Care Physician:  No primary care provider on file. Primary Gastroenterologist:  unassigned  Wife Sondra Laconte  [phone].   Reason for Consultation:  Obstructive jaundice.    HPI: Eliseo Withers is a 70 y.o. male.  no regular medical care.  Obese.    Hx knee surgery, tonsillectomy and arthritis.  Had benign screening colonoscopy in [place] ` 2007.     3 to 4 weeks of dark urine and progressive jaundice, pruritus, weakness, epigastric pain, FTT, abdominal swelling, pale stools, milld LUQ discomfort but not pain, anorexia, "black" urine, somnolence, some confusion.  Some stool and urinary incontinence is new.   Family noticed the jaundice started 1 week ago.  He has not touched ETOH since his teens, early 45s.  No hx liver issues.  Labs with T bili of 14.7, alk phos 535, AST/ALT 152/78.  Ammonia 48.  coags 21/1.8.  K 2.7, normal BUN/Creat. CBC ok.   Ultrasound: GB mildly distended, small gallstone.  heterogeneous liver.  Dilated intra and extrahepatic ducts, 11 mm CBD.  CT scan:  2 cm mass in head of pancreas, CBD 1.7 cm and intrahepatic duct dilatation, abnormal liver parenchyma.        Past Medical History  Diagnosis Date  . Arthritis     Past Surgical History  Procedure Laterality Date  . Tonsillectomy    . 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 Authorizing Provider  acetaminophen (TYLENOL) 500 MG tablet Take 500 mg by mouth 3 (three) times daily. Take every day per patient   Yes  Historical Provider, MD  aspirin 325 MG tablet Take 325 mg by mouth daily.   Yes Historical Provider, MD  ferrous sulfate 325 (65 FE) MG tablet Take 1 tablet (325 mg total) by mouth 3 (three) times daily after meals. Patient not taking: Reported on 05/13/2015 05/26/12   Latanya Maudlin, MD  methocarbamol (ROBAXIN) 500 MG tablet Take 1 tablet (500 mg total) by mouth every 6 (six) hours as needed. Patient not taking: Reported on 05/13/2015 05/25/12   Latanya Maudlin, MD  oxyCODONE-acetaminophen (PERCOCET/ROXICET) 5-325 MG per tablet Take 1-2 tablets by mouth every 4 (four) hours as needed. Patient not taking: Reported on 05/13/2015 05/25/12   Latanya Maudlin, MD  polyethylene glycol [facility] / Floria Raveling) packet Take 17 g by mouth daily as needed. Patient not taking: Reported on 05/13/2015 05/25/12   Latanya Maudlin, MD  rivaroxaban (XARELTO) 10 MG TABS tablet Take 1 tablet (10 mg total) by mouth daily with breakfast. Patient not taking: Reported on 05/13/2015 05/26/12   Latanya Maudlin, MD    Scheduled Meds: . diphenhydrAMINE  12.5 mg Intravenous Once  . potassium chloride  20 mEq Oral Daily   Infusions: . sodium chloride  PRN Meds: acetaminophen **OR** acetaminophen, HYDROcodone-acetaminophen, morphine injection, ondansetron **OR** ondansetron (ZOFRAN) IV, traZODone   Allergies as of 05/13/2015  . (No Known Allergies)    Family History  Problem Relation Age of Onset  . Breast cancer Mother     Social History   Social History  . Marital Status: Married    Spouse Name: N/A  . Number of Children: N/A  . Years of Education: N/A   Occupational History  . Not on file.   Social History Main Topics  . Smoking status: Never Smoker   . Smokeless tobacco: Current User    Types: Chew  . Alcohol Use: No  . Drug Use: No  . Sexual Activity: Not on file   Other Topics Concern  . Not on file   Social History Narrative    REVIEW OF SYSTEMS: Constitutional:  Per HPI.  Weakness, sleeping a lot.   No weight gain or loss but belly getting bigger ENT:  No nose bleeds Pulm:  No cough or dyspnea CV:  No palpitations, no LE edema. No chest pain.  GU:  No hematuria, no frequency GI:  Per HPI Heme:  No unusual bleeding or bruising   Transfusions:  None  Neuro:  No headaches, no peripheral tingling or numbness Derm:  No itching, no rash or sores.  Endocrine:  No sweats or chills.  No polyuria or dysuria Immunization:  Not recently.     PHYSICAL EXAM: Vital signs in last 24 hours: Filed Vitals:   05/14/15 0630 05/14/15 0730  BP: 135/67 128/56  Pulse: 99 97  Temp:    Resp:     Wt Readings from Last 3 Encounters:  05/26/12 146.4 kg (322 lb 12.1 oz)  05/19/12 143.79 kg (317 lb)    General: obese, jaundiced, unwell. WM Head:  No swelling or asymmetry.    Eyes:  No conj pallor.  + icterus Ears:  Lesion on right ear is crusted with dry blood  Nose:  No lesions or discharge Mouth:  Clear and moist.  Full dentures in place Neck:  No mass, no JVD Lungs:  Clear bil.  No cough.  Heart: RRR.  No MRG.  S1/S2 audible Abdomen:  Obese, no mass, hypoactive BS, protuberant.  Not tender.   Rectal: deferred   GU:  No scrotal edema.  Musc/Skeltl: no joint erythema or swelling.  Extremities: fat legs but no pedal or ankle edema and no pitting  Neurologic:  Oriented x 3.  No asterixis.  Moves all 4s.  Drowsy and delays in answering questions.  Skin:  Excoriation marks on lower legs, on upper back and right upper chest.  telangectasias on chest.  Nodes:  No cervical adenopathy   Psych:  Pleasant but drowsy.  Cooperative, affect bland and depressed.   Intake/Output from previous day:   Intake/Output this shift:    LAB RESULTS:  Recent Labs  05/13/15 2201  WBC 8.3  HGB 12.9*  HCT 39.2  PLT 437*   BMET Lab Results  Component Value Date   NA 138 05/13/2015   NA 133* 05/27/2012   NA 136 05/26/2012   K 2.7* 05/13/2015   K 4.0 05/27/2012   K 4.0 05/26/2012   CL 103 05/13/2015     CL 98 05/27/2012   CL 101 05/26/2012   CO2 21* 05/13/2015   CO2 30 05/27/2012   CO2 26 05/26/2012   GLUCOSE 111* 05/13/2015   GLUCOSE 112* 05/27/2012   GLUCOSE 125* 05/26/2012   BUN 14  05/13/2015   BUN 15 05/27/2012   BUN 15 05/26/2012   CREATININE 0.67 05/13/2015   CREATININE 0.66 05/27/2012   CREATININE 0.62 05/26/2012   CALCIUM 8.4* 05/13/2015   CALCIUM 8.3* 05/27/2012   CALCIUM 8.1* 05/26/2012   LFT  Recent Labs  05/13/15 2201  PROT 7.6  ALBUMIN 2.5*  AST 152*  ALT 78*  ALKPHOS 535*  BILITOT 14.7*   PT/INR Lab Results  Component Value Date   INR 1.82* 05/14/2015   INR 1.00 05/19/2012   Hepatitis Panel No results for input(s): HEPBSAG, HCVAB, HEPAIGM, HEPBIGM in the last 72 hours. C-Diff No components found for: CDIFF Lipase     Component Value Date/Time   LIPASE 35 05/13/2015 2201    Drugs of Abuse  No results found for: LABOPIA, COCAINSCRNUR, LABBENZ, AMPHETMU, THCU, LABBARB   RADIOLOGY STUDIES: US Abdomen Complete  05/14/2015  CLINICAL DATA:  Jaundice for 3 days, question obstruction. Abdominal discomfort. EXAM: ABDOMEN ULTRASOUND COMPLETE COMPARISON:  None. FINDINGS: Gallbladder: Mildly distended with a small gallstone. No wall thickening visualized. No sonographic Murphy sign noted by sonographer. Common bile duct: Diameter: 11 mm at the porta hepatis. Not visualized distally. Liver: Diffuse increased and heterogeneous in echogenicity. Liver parenchyma is difficult to penetrate. No gross focal lesion. Normal directional flow in the main portal vein. There is central intrahepatic biliary ductal dilatation. IVC: No abnormality visualized. Pancreas: Not well visualized due to body habitus and bowel gas. Spleen: Size and appearance within normal limits were visualized. Right Kidney: Length: 13.4 cm. Echogenicity within normal limits. No mass or hydronephrosis visualized. Left Kidney: Length: 12.6 cm. Echogenicity within normal limits. No mass or hydronephrosis  visualized. Abdominal aorta: Majority obscured due to bowel gas and habitus. No aneurysm visualized proximally, mid and distal obscured. Other findings: None. Technically challenging exam due to body habitus and bowel gas. IMPRESSION: 1. Intra and extrahepatic biliary ductal dilatation. Mild gallbladder distention. Findings concerning for distal obstruction, etiology not visualized sonographically. Further evaluation is warranted with CT or MRCP. 2. Increased hepatic echogenicity, likely secondary to steatosis, liver parenchyma difficult to penetrate. 3. Limited assessment of midline structures. Electronically Signed   By: Jeb Levering M.D.   On: 05/14/2015 01:45   Ct Abdomen Pelvis W Contrast  05/14/2015  CLINICAL DATA:  Right upper quadrant abdominal tenderness in jaundice. Biliary dilatation on ultrasound. EXAM: CT ABDOMEN AND PELVIS WITH CONTRAST TECHNIQUE: Multidetector CT imaging of the abdomen and pelvis was performed using the standard protocol following bolus administration of intravenous contrast. CONTRAST:  1101m ISOVUE-300 IOPAMIDOL (ISOVUE-300) INJECTION 61% COMPARISON:  Abdominal ultrasound earlier this day. FINDINGS: Lower chest: Heart at the upper limits of normal in size. Coronary artery calcifications. No pleural effusion. No consolidation. Liver: Enlarged of decreased density. No focal lesion. Mild motion artifact partially obscures inferior liver. Hepatobiliary: Intra and extrahepatic biliary ductal dilatation. Gallbladder is distended. Mild gallbladder wall thickening. Common bile duct measures up to 1.7 cm in the midportion. No calcified choledocholithiasis. Mild motion artifact. Pancreas: Questionable hypodense lesion in the head measuring 2 cm. No pancreatic ductal dilatation. No peripancreatic inflammation. Mild motion artifact. Spleen: Normal. Adrenal glands: No nodule. Kidneys: Symmetric renal enhancement and excretion. No hydronephrosis. Stomach/Bowel: Stomach physiologically  distended. There are no dilated or thickened small bowel loops. Moderate volume of stool throughout the colon without colonic wall thickening. There is colonic tortuosity. The appendix is normal. Vascular/Lymphatic: No retroperitoneal adenopathy. Abdominal aorta is normal in caliber. Moderate atherosclerosis without aneurysm. Reproductive: Enlarged prostate gland measuring 6.8 cm transverse.  Small bilateral external iliac nodes, all subcentimeter in size. Bladder: Physiologically distended, no wall thickening. Other: No free air, free fluid, or intra-abdominal fluid collection. Fat containing umbilical hernia. Musculoskeletal: There are no acute or suspicious osseous abnormalities. Multilevel degenerative change throughout the spine. Degenerative change of both hips. IMPRESSION: 1. 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 This is incompletely characterized. MRCP recommended if patient is able to tolerate breath hold technique. 2. Incidental finding of prostatomegaly. Atherosclerosis including coronary artery calcifications. Electronically Signed   By: Jeb Levering M.D.   On: 05/14/2015 05:59    ENDOSCOPIC STUDIES: none  IMPRESSION:   *  Obstructive jaundice with ? Pancreatic mass.    *  Elevated ammonia level.    *  Fatty and heterogenous liver on imaging, telangectasia on skin, increased ammonia:  Suggests cirrhosis in obese, nondrinker.   *  Coagulopathy.    *  Hypokalemia.    *  CAD by CT scan.    PLAN:     *  Has ERCP set up for noon tomorrow. Discussed pt , dtr's and wife. Eat today.   *  Vitamin K SQ now.  Coags, CMET ordered for AM   Azucena Freed  05/14/2015, 9:02 AM Pager: [phone]

## 2015-05-14 NOTE — ED Notes (Signed)
Admitting MD at bedside.

## 2015-05-14 NOTE — ED Notes (Signed)
Patient transported to CT 

## 2015-05-14 NOTE — ED Notes (Signed)
Admit provider at bedside 

## 2015-05-15 ENCOUNTER — Encounter (HOSPITAL_COMMUNITY): Payer: Self-pay | Admitting: Certified Registered Nurse Anesthetist

## 2015-05-15 ENCOUNTER — Inpatient Hospital Stay (HOSPITAL_COMMUNITY): Payer: PPO | Admitting: Certified Registered Nurse Anesthetist

## 2015-05-15 ENCOUNTER — Inpatient Hospital Stay (HOSPITAL_COMMUNITY): Payer: PPO

## 2015-05-15 ENCOUNTER — Other Ambulatory Visit (HOSPITAL_COMMUNITY): Payer: PPO

## 2015-05-15 ENCOUNTER — Telehealth: Payer: Self-pay

## 2015-05-15 ENCOUNTER — Encounter (HOSPITAL_COMMUNITY)
Admission: EM | Disposition: A | Discharge status: Home Health | Payer: Self-pay | Source: Home / Self Care | Attending: Internal Medicine

## 2015-05-15 DIAGNOSIS — E876 Hypokalemia: Secondary | ICD-10-CM | POA: Diagnosis not present

## 2015-05-15 DIAGNOSIS — K8689 Other specified diseases of pancreas: Secondary | ICD-10-CM | POA: Diagnosis not present

## 2015-05-15 DIAGNOSIS — C259 Malignant neoplasm of pancreas, unspecified: Secondary | ICD-10-CM | POA: Diagnosis not present

## 2015-05-15 DIAGNOSIS — D689 Coagulation defect, unspecified: Secondary | ICD-10-CM | POA: Diagnosis not present

## 2015-05-15 DIAGNOSIS — K831 Obstruction of bile duct: Secondary | ICD-10-CM | POA: Diagnosis not present

## 2015-05-15 DIAGNOSIS — K838 Other specified diseases of biliary tract: Secondary | ICD-10-CM | POA: Diagnosis not present

## 2015-05-15 DIAGNOSIS — K8061 Calculus of gallbladder and bile duct with cholecystitis, unspecified, with obstruction: Secondary | ICD-10-CM | POA: Diagnosis not present

## 2015-05-15 DIAGNOSIS — S01301A Unspecified open wound of right ear, initial encounter: Secondary | ICD-10-CM | POA: Diagnosis not present

## 2015-05-15 DIAGNOSIS — F17220 Nicotine dependence, chewing tobacco, uncomplicated: Secondary | ICD-10-CM | POA: Diagnosis not present

## 2015-05-15 DIAGNOSIS — K8051 Calculus of bile duct without cholangitis or cholecystitis with obstruction: Secondary | ICD-10-CM | POA: Diagnosis not present

## 2015-05-15 DIAGNOSIS — K8021 Calculus of gallbladder without cholecystitis with obstruction: Secondary | ICD-10-CM | POA: Diagnosis not present

## 2015-05-15 DIAGNOSIS — L299 Pruritus, unspecified: Secondary | ICD-10-CM | POA: Diagnosis not present

## 2015-05-15 DIAGNOSIS — R17 Unspecified jaundice: Secondary | ICD-10-CM | POA: Diagnosis not present

## 2015-05-15 DIAGNOSIS — N401 Enlarged prostate with lower urinary tract symptoms: Secondary | ICD-10-CM | POA: Diagnosis not present

## 2015-05-15 DIAGNOSIS — Z23 Encounter for immunization: Secondary | ICD-10-CM | POA: Diagnosis not present

## 2015-05-15 DIAGNOSIS — K869 Disease of pancreas, unspecified: Secondary | ICD-10-CM | POA: Diagnosis not present

## 2015-05-15 DIAGNOSIS — R5381 Other malaise: Secondary | ICD-10-CM

## 2015-05-15 DIAGNOSIS — I2510 Atherosclerotic heart disease of native coronary artery without angina pectoris: Secondary | ICD-10-CM | POA: Diagnosis not present

## 2015-05-15 DIAGNOSIS — E46 Unspecified protein-calorie malnutrition: Secondary | ICD-10-CM | POA: Diagnosis not present

## 2015-05-15 DIAGNOSIS — Z7982 Long term (current) use of aspirin: Secondary | ICD-10-CM | POA: Diagnosis not present

## 2015-05-15 DIAGNOSIS — R740 Nonspecific elevation of levels of transaminase and lactic acid dehydrogenase [LDH]: Secondary | ICD-10-CM | POA: Diagnosis not present

## 2015-05-15 DIAGNOSIS — N400 Enlarged prostate without lower urinary tract symptoms: Secondary | ICD-10-CM | POA: Diagnosis not present

## 2015-05-15 DIAGNOSIS — Z6841 Body Mass Index (BMI) 40.0 and over, adult: Secondary | ICD-10-CM | POA: Diagnosis not present

## 2015-05-15 DIAGNOSIS — M1990 Unspecified osteoarthritis, unspecified site: Secondary | ICD-10-CM | POA: Diagnosis not present

## 2015-05-15 HISTORY — PX: ERCP: SHX5425

## 2015-05-15 LAB — CBC
HCT: 34.7 % — ABNORMAL LOW (ref 39.0–52.0)
Hemoglobin: 11.6 g/dL — ABNORMAL LOW (ref 13.0–17.0)
MCH: 30.2 pg (ref 26.0–34.0)
MCHC: 33.4 g/dL (ref 30.0–36.0)
MCV: 90.4 fL (ref 78.0–100.0)
PLATELETS: 364 10*3/uL (ref 150–400)
RBC: 3.84 MIL/uL — AB (ref 4.22–5.81)
RDW: 21.8 % — ABNORMAL HIGH (ref 11.5–15.5)
WBC: 8.2 10*3/uL (ref 4.0–10.5)

## 2015-05-15 LAB — COMPREHENSIVE METABOLIC PANEL
ALK PHOS: 458 U/L — AB (ref 38–126)
ALT: 71 U/L — AB (ref 17–63)
AST: 154 U/L — ABNORMAL HIGH (ref 15–41)
Albumin: 2.2 g/dL — ABNORMAL LOW (ref 3.5–5.0)
Anion gap: 13 (ref 5–15)
BILIRUBIN TOTAL: 16.1 mg/dL — AB (ref 0.3–1.2)
BUN: 8 mg/dL (ref 6–20)
CALCIUM: 8.2 mg/dL — AB (ref 8.9–10.3)
CO2: 23 mmol/L (ref 22–32)
CREATININE: 0.44 mg/dL — AB (ref 0.61–1.24)
Chloride: 106 mmol/L (ref 101–111)
GFR calc non Af Amer: >60 mL/min (ref >60)
GLUCOSE: 108 mg/dL — AB (ref 65–99)
Potassium: 2.6 mmol/L — CL (ref 3.5–5.1)
SODIUM: 142 mmol/L (ref 135–145)
TOTAL PROTEIN: 6.6 g/dL (ref 6.5–8.1)

## 2015-05-15 LAB — POCT I-STAT 4, (NA,K, GLUC, HGB,HCT)
Glucose, Bld: 106 mg/dL — ABNORMAL HIGH (ref 65–99)
HCT: 39 % (ref 39.0–52.0)
HEMOGLOBIN: 13.3 g/dL (ref 13.0–17.0)
Potassium: 2.9 mmol/L — ABNORMAL LOW (ref 3.5–5.1)
Sodium: 141 mmol/L (ref 135–145)

## 2015-05-15 LAB — MAGNESIUM: Magnesium: 2 mg/dL (ref 1.7–2.4)

## 2015-05-15 LAB — HEPATITIS PANEL, ACUTE
HCV Ab: <0.1 s/co ratio (ref 0.0–0.9)
HEP B C IGM: NEGATIVE
HEP B S AG: NEGATIVE
Hep A IgM: NEGATIVE

## 2015-05-15 LAB — PROTIME-INR
INR: 1.24 (ref 0.00–1.49)
PROTHROMBIN TIME: 15.8 s — AB (ref 11.6–15.2)

## 2015-05-15 LAB — CEA: CEA: 7.4 ng/mL — ABNORMAL HIGH (ref 0.0–4.7)

## 2015-05-15 LAB — CANCER ANTIGEN 19-9: CA 19 9: 2014 U/mL — AB (ref 0–35)

## 2015-05-15 SURGERY — ERCP, WITH INTERVENTION IF INDICATED
Anesthesia: General

## 2015-05-15 MED ORDER — ROCURONIUM BROMIDE 100 MG/10ML IV SOLN
INTRAVENOUS | Status: DC | PRN
  Administered 2015-05-15: 30 mg via INTRAVENOUS
  Administered 2015-05-15: 20 mg via INTRAVENOUS

## 2015-05-15 MED ORDER — GLUCAGON HCL RDNA (DIAGNOSTIC) 1 MG IJ SOLR
INTRAMUSCULAR | Status: AC
  Filled 2015-05-15: qty 1

## 2015-05-15 MED ORDER — POTASSIUM CHLORIDE 10 MEQ/100ML IV SOLN
10.0000 meq | INTRAVENOUS | Status: AC
  Administered 2015-05-15 (×4): 10 meq via INTRAVENOUS
  Filled 2015-05-15 (×4): qty 100

## 2015-05-15 MED ORDER — GLUCAGON HCL RDNA (DIAGNOSTIC) 1 MG IJ SOLR
INTRAMUSCULAR | Status: DC | PRN
  Administered 2015-05-15 (×2): .5 mg via INTRAVENOUS

## 2015-05-15 MED ORDER — SODIUM CHLORIDE 0.9 % IV SOLN: INTRAVENOUS | Status: DC

## 2015-05-15 MED ORDER — SODIUM CHLORIDE 0.9 % IV SOLN
3.0000 g | INTRAVENOUS | Status: AC
  Administered 2015-05-15: 3 g via INTRAVENOUS
  Filled 2015-05-15: qty 3

## 2015-05-15 MED ORDER — IOPAMIDOL (ISOVUE-300) INJECTION 61%
INTRAVENOUS | Status: AC
  Filled 2015-05-15: qty 50

## 2015-05-15 MED ORDER — INDOMETHACIN 50 MG RE SUPP
100.0000 mg | Freq: Once | RECTAL | Status: AC
  Administered 2015-05-15: 100 mg via RECTAL
  Filled 2015-05-15: qty 2

## 2015-05-15 MED ORDER — SODIUM CHLORIDE 0.9 % IV SOLN
INTRAVENOUS | Status: DC | PRN
  Administered 2015-05-15: 80 mL

## 2015-05-15 MED ORDER — PROPOFOL 10 MG/ML IV BOLUS
INTRAVENOUS | Status: DC | PRN
  Administered 2015-05-15: 60 mg via INTRAVENOUS
  Administered 2015-05-15: 50 mg via INTRAVENOUS

## 2015-05-15 MED ORDER — INDOMETHACIN 50 MG RE SUPP
RECTAL | Status: AC
  Filled 2015-05-15: qty 2

## 2015-05-15 MED ORDER — FENTANYL CITRATE (PF) 100 MCG/2ML IJ SOLN
INTRAMUSCULAR | Status: DC | PRN
  Administered 2015-05-15: 100 ug via INTRAVENOUS

## 2015-05-15 MED ORDER — LACTATED RINGERS IV SOLN
INTRAVENOUS | Status: DC | PRN
  Administered 2015-05-15: 13:00:00 via INTRAVENOUS

## 2015-05-15 MED ORDER — POTASSIUM CHLORIDE IN NACL 40-0.9 MEQ/L-% IV SOLN
INTRAVENOUS | Status: DC
  Administered 2015-05-15 – 2015-05-16 (×3): 100 mL/h via INTRAVENOUS
  Filled 2015-05-15 (×19): qty 1000

## 2015-05-15 MED ORDER — ONDANSETRON HCL 4 MG/2ML IJ SOLN
INTRAMUSCULAR | Status: DC | PRN
  Administered 2015-05-15: 4 mg via INTRAVENOUS

## 2015-05-15 MED ORDER — SUGAMMADEX SODIUM 200 MG/2ML IV SOLN
INTRAVENOUS | Status: DC | PRN
  Administered 2015-05-15: 200 mg via INTRAVENOUS

## 2015-05-15 MED ORDER — PHENYLEPHRINE HCL 10 MG/ML IJ SOLN
INTRAMUSCULAR | Status: DC | PRN
  Administered 2015-05-15: 40 ug via INTRAVENOUS
  Administered 2015-05-15 (×2): 80 ug via INTRAVENOUS

## 2015-05-15 NOTE — Transfer of Care (Signed)
Immediate Anesthesia Transfer of Care Note  Patient: Ryan Roach  Procedure(s) Performed: Procedure(s): ENDOSCOPIC RETROGRADE CHOLANGIOPANCREATOGRAPHY (ERCP) (N/A)  Patient Location: PACU  Anesthesia Type:General  Level of Consciousness: awake, alert , oriented, patient cooperative and responds to stimulation  Airway & Oxygen Therapy: Patient Spontanous Breathing and Patient connected to nasal cannula oxygen  Post-op Assessment: Report given to RN and Post -op Vital signs reviewed and stable  Post vital signs: Reviewed and stable  Last Vitals:  Filed Vitals:   05/15/15 1435 05/15/15 1445  BP: 120/59 132/58  Pulse: 90 87  Temp:    Resp: 21 20    Complications: No apparent anesthesia complications

## 2015-05-15 NOTE — Progress Notes (Signed)
[facility] PROGRESS NOTE  Ryan Roach [id number] DOB: 06-21-1945 DOA: 05/13/2015 PCP: No primary care provider on file.  Brief narrative 70 year old obese male with no prior medical history, not seeked regular medical care presented to  the ED with re-4 weeks of dark urine, progressive jaundice, generalized itching, epigastric discomfort, weakness and abdominal distention. He reported passing pale stool, poor appetite and increased sleepiness. Wife noticed that patient was increasingly jaundiced for the past 1 week. Patient doesn't smoke or drink. In the ED workup showed the range LFTs with total bilirubin of 14.7, AST/ALT of 152/78, alkaline phosphatase of 535, INR of 1.82 and ammonia 48. Ultrasound abdomen showed intra-and except hepatic biliary dilatation concerning for distal obstruction with increased hepatic echogenicity. CT abdomen and pelvis showed a 2 cm pancreatic head mass with intra-and except hepatic biliary dilatation. Liver was found to be enlarged without focal lesion. Patient admitted to hospitalist service and [facility] consulted.    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 CT chest without thoracic metastases.  Markedly elevated CEA, CA 19-9 and LDH. ERCP today with biliary stent placement for decompression of his biliary tree to relieve his symptoms and biliary brushing. GI suggested that patient is possibly need EUS with biopsy and possible metal stent placement as outpatient. Will need oncology evaluation once tissue diagnosis obtained. (Patient does not have a PCP.  I will establish care with oncology prior to discharge). monitor LFTs in the morning. INR improved after receiving vitamin K on admission. Started on lactulose for elevated ammonia and given concern for instability. Now stable.  Nonhealing right ear wound Has an eschar over right ear. No drainage. Wound care  consulted. Lesion present for about a year with episodes of bleeding. Patient will need evaluation by dermatologist for biopsy as outpatient.  Severe hypokalemia Replenish with IV potassium and fluids. Magnesium normal.  Malnutrition and deconditioning Nutrition and PT evaluation.  Enlarged prostate Incidentally seen on imaging. Will ask pt of any BPH symptoms once he's back for EGD.  DVT prophylaxis: CD's  Diet: Nothing by mouth for ERCP  Code Status:  full code Family Communication: Wife at bedside Disposition Plan: Home possibly tomorrow if improved after ERCP and stenting   Consultants:  [facility]  Procedures:  CT abdomen and pelvis    Antibiotics:  None  HPI/Subjective: Seen and examined. Denies any pain. Itching better  Objective: Filed Vitals:   05/15/15 0518 05/15/15 1143  BP: 152/70 163/63  Pulse: 83 85  Temp: 98.5 F (36.9 C) 98.2 F (36.8 C)  Resp: 18 19    Intake/Output Summary (Last 24 hours) at 05/15/15 1241 Last data filed at 05/15/15 0500  Gross per 24 hour  Intake    960 ml  Output    700 ml  Net    260 ml   There were no vitals filed for this visit.  Exam:   General:  Middle aged obese male not in distress  HEENT: No pallor, icteric, moist mucosa  Cardiovascular: Normal S1 and S2, no murmurs rub or gallop  Respiratory: Clear bilaterally, no added sounds  Abdomen: Soft, nondistended, nontender, bowel sounds present  Musculoskeletal: Warm, no edema, jaundiced skin   CNS: Alert and oriented   Data Reviewed: Basic Metabolic Panel:  Recent Labs Lab 05/13/15 2201 05/14/15 0902 05/15/15 0623 05/15/15 1221  NA 138  --  142 141  K 2.7*  --  2.6* 2.9*  CL 103  --  106  --   CO2 21*  --  23  --   GLUCOSE 111*  --  108* 106*  BUN 14  --  8  --   CREATININE 0.67  --  0.44*  --   CALCIUM 8.4*  --  8.2*  --   MG  --  1.9 2.0  --    Liver Function Tests:  Recent Labs Lab 05/13/15 2201 05/15/15 0623  AST 152* 154*   ALT 78* 71*  ALKPHOS 535* 458*  BILITOT 14.7* 16.1*  PROT 7.6 6.6  ALBUMIN 2.5* 2.2*    Recent Labs Lab 05/13/15 2201  LIPASE 35    Recent Labs Lab 05/14/15 0138  AMMONIA 48*   CBC:  Recent Labs Lab 05/13/15 2201 05/15/15 0623 05/15/15 1221  WBC 8.3 8.2  --   HGB 12.9* 11.6* 13.3  HCT 39.2 34.7* 39.0  MCV 88.9 90.4  --   PLT 437* 364  --    Cardiac Enzymes: No results for input(s): CKTOTAL, CKMB, CKMBINDEX, TROPONINI in the last 168 hours. BNP (last 3 results) No results for input(s): BNP in the last 8760 hours.  ProBNP (last 3 results) No results for input(s): PROBNP in the last 8760 hours.  CBG: No results for input(s): GLUCAP in the last 168 hours.  No results found for this or any previous visit (from the past 240 hour(s)).   Studies: Ct Chest W Contrast  05/14/2015  CLINICAL DATA:  Cancer work up patient has a pancreatic mass on [place] and CT abdomen today. Patient denies any shortness of breath, or chest pain. No history of smoking, does chew tobacco EXAM: CT CHEST WITH CONTRAST TECHNIQUE: Multidetector CT imaging of the chest was performed during intravenous contrast administration. CONTRAST:  1 ISOVUE-300 IOPAMIDOL (ISOVUE-300) INJECTION 61% COMPARISON:  CT abdomen from earlier the same day FINDINGS: Mediastinum/Lymph Nodes: No masses, pathologically enlarged lymph nodes, or other significant abnormality. Scattered aortic and coronary calcifications. No pericardial effusion. Lungs/Pleura: No nodule, infiltrate, or consolidation. No pleural effusion or pneumothorax. Upper abdomen: Intra and extrahepatic biliary ductal dilatation. Musculoskeletal: Bridging osteophytes across multiple contiguous levels in the mid and lower thoracic spine. Sternum intact. Negative for fracture. IMPRESSION: 1. No evidence of thoracic metastatic disease. 2. Atherosclerosis, including aortic and coronary artery disease. Please note that although the presence of coronary artery calcium  documents the presence of coronary artery disease, the severity of this disease and any potential stenosis cannot be assessed on this non-gated CT examination. Assessment for potential risk factor modification, dietary therapy or pharmacologic therapy may be warranted, if clinically indicated. Electronically Signed   By: Lucrezia Europe M.D.   On: 05/14/2015 10:40   US Abdomen Complete  05/14/2015  CLINICAL DATA:  Jaundice for 3 days, question obstruction. Abdominal discomfort. EXAM: ABDOMEN ULTRASOUND COMPLETE COMPARISON:  None. FINDINGS: Gallbladder: Mildly distended with a small gallstone. No wall thickening visualized. No sonographic Murphy sign noted by sonographer. Common bile duct: Diameter: 11 mm at the porta hepatis. Not visualized distally. Liver: Diffuse increased and heterogeneous in echogenicity. Liver parenchyma is difficult to penetrate. No gross focal lesion. Normal directional flow in the main portal vein. There is central intrahepatic biliary ductal dilatation. IVC: No abnormality visualized. Pancreas: Not well visualized due to body habitus and bowel gas. Spleen: Size and appearance within normal limits were visualized. Right Kidney: Length: 13.4 cm. Echogenicity within normal limits. No mass or hydronephrosis visualized. Left Kidney: Length: 12.6 cm. Echogenicity within normal limits. No mass or hydronephrosis visualized.  Abdominal aorta: Majority obscured due to bowel gas and habitus. No aneurysm visualized proximally, mid and distal obscured. Other findings: None. Technically challenging exam due to body habitus and bowel gas. IMPRESSION: 1. Intra and extrahepatic biliary ductal dilatation. Mild gallbladder distention. Findings concerning for distal obstruction, etiology not visualized sonographically. Further evaluation is warranted with CT or MRCP. 2. Increased hepatic echogenicity, likely secondary to steatosis, liver parenchyma difficult to penetrate. 3. Limited assessment of midline  structures. Electronically Signed   By: Jeb Levering M.D.   On: 05/14/2015 01:45   Ct Abdomen Pelvis W Contrast  05/14/2015  CLINICAL DATA:  Right upper quadrant abdominal tenderness in jaundice. Biliary dilatation on ultrasound. EXAM: CT ABDOMEN AND PELVIS WITH CONTRAST TECHNIQUE: Multidetector CT imaging of the abdomen and pelvis was performed using the standard protocol following bolus administration of intravenous contrast. CONTRAST:  170mL ISOVUE-300 IOPAMIDOL (ISOVUE-300) INJECTION 61% COMPARISON:  Abdominal ultrasound earlier this day. FINDINGS: Lower chest: Heart at the upper limits of normal in size. Coronary artery calcifications. No pleural effusion. No consolidation. Liver: Enlarged of decreased density. No focal lesion. Mild motion artifact partially obscures inferior liver. Hepatobiliary: Intra and extrahepatic biliary ductal dilatation. Gallbladder is distended. Mild gallbladder wall thickening. Common bile duct measures up to 1.7 cm in the midportion. No calcified choledocholithiasis. Mild motion artifact. Pancreas: Questionable hypodense lesion in the head measuring 2 cm. No pancreatic ductal dilatation. No peripancreatic inflammation. Mild motion artifact. Spleen: Normal. Adrenal glands: No nodule. Kidneys: Symmetric renal enhancement and excretion. No hydronephrosis. Stomach/Bowel: Stomach physiologically distended. There are no dilated or thickened small bowel loops. Moderate volume of stool throughout the colon without colonic wall thickening. There is colonic tortuosity. The appendix is normal. Vascular/Lymphatic: No retroperitoneal adenopathy. Abdominal aorta is normal in caliber. Moderate atherosclerosis without aneurysm. Reproductive: Enlarged prostate gland measuring 6.8 cm transverse. Small bilateral external iliac nodes, all subcentimeter in size. Bladder: Physiologically distended, no wall thickening. Other: No free air, free fluid, or intra-abdominal fluid collection. Fat  containing umbilical hernia. Musculoskeletal: There are no acute or suspicious osseous abnormalities. Multilevel degenerative change throughout the spine. Degenerative change of both hips. IMPRESSION: 1. 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 This is incompletely characterized. MRCP recommended if patient is able to tolerate breath hold technique. 2. Incidental finding of prostatomegaly. Atherosclerosis including coronary artery calcifications. Electronically Signed   By: Jeb Levering M.D.   On: 05/14/2015 05:59    Scheduled Meds: . ampicillin-sulbactam (UNASYN) IVPB 3 g  3 g Intravenous To Endo  . indomethacin  100 mg Rectal Once  . [MAR Hold] lactulose  20 g Oral TID  . [MAR Hold] potassium chloride  20 mEq Oral Daily   Continuous Infusions: . sodium chloride    . 0.9 % NaCl with KCl 40 mEq / L        Time spent: 25 minutes    Ola Fawver  [facility] Pager [phone] If 7PM-7AM, please contact night-coverage at [email or web address], password [facility] 05/15/2015, 12:41 PM  LOS: 1 day

## 2015-05-15 NOTE — Anesthesia Procedure Notes (Signed)
Procedure Name: Intubation Date/Time: 05/15/2015 12:52 PM Performed by: Judeth Cornfield T Pre-anesthesia Checklist: Patient identified, Emergency Drugs available, Suction available, Patient being monitored and Timeout performed Patient Re-evaluated:Patient Re-evaluated prior to inductionOxygen Delivery Method: Circle system utilized Preoxygenation: Pre-oxygenation with 100% oxygen Intubation Type: IV induction Ventilation: Mask ventilation without difficulty Laryngoscope Size: McGraph and 3 Grade View: Grade I Tube type: Oral Tube size: 7.5 mm Number of attempts: 1 Airway Equipment and Method: Stylet Placement Confirmation: ETT inserted through vocal cords under direct vision,  positive ETCO2 and breath sounds checked- equal and bilateral Secured at: 22 cm Tube secured with: Tape Dental Injury: Teeth and Oropharynx as per pre-operative assessment

## 2015-05-15 NOTE — Progress Notes (Addendum)
Initial Nutrition Assessment  DOCUMENTATION CODES:   Morbid obesity  INTERVENTION:   -Continue to encourage PO intake  NUTRITION DIAGNOSIS:   Increased nutrient needs related to cancer and cancer related treatments as evidenced by estimated needs.   GOAL:   Patient will meet greater than or equal to 90% of their needs  MONITOR:   PO intake, Labs, Weight trends, Skin, I & O's  REASON FOR ASSESSMENT:   Consult Assessment of nutrition requirement/status  ASSESSMENT:   Ryan Roach is a 70 y.o. male with a Past Medical History of arthritis who presents with jaundice and pancreatic mass likely pancreatic cancer. Will need workup including staging and biopsy. GI to follow and assist. Oncology once biopsy and staging complete. R ear lesion likely scc or bcc and can have outpt excision/Biopsy.  Pt admitted with obstructive jaundice, likely due to pancreatic cancer. Per MD notes, CT scan showed evidenced of pancreatic mass.   S/p Procedure(s) 05/15/15: ENDOSCOPIC RETROGRADE CHOLANGIOPANCREATOGRAPHY (ERCP)  Pt just returned from ERCP prior to RD visit. Pt in good spirits and laughing and joking upon RD visit. Pt reports he is very eager to eat.   Hx obtained from pt and pt wife at bedside. Both confirm a general decline in health over the past month, which he suspected was due to a "bug" (pt works part-time as a Teacher, early years/pre). Pt and wife confirm that pt generally has a hearty appetite and eats constantly, due to habit of having an irregular meal schedule due to varied shifts prior to retirement. Pt wife shares that pt has developed healthier eating habits over the past 6 months. Pt generally consumes 2-3 meals per day (Breakfast: cereal with banana OR grits, eggs, and toast, Lunch/Dinner: spaghetti or chicken and vegetables). Main beverages are water and diet mountain dew. Pt wife reports that pt with decreased appetite PTA due to early satiety as a result of abdominal distention. Pt  was consuming mainly "soft foods" such as chicken noodle soup and mashed potatoes. Pt reports he ate well in ED yesterday, consuming most of his dinner and a [place] sandwich.   Pt unable to provide accurate wt hx. Per pt wife, pt does not keep up with weight. She reports he last weighed when he broke his leg in 09/2014 and weighed 310#. Pt wife suspects any weight loss is likely due to pt adopting better eating habits over the past 6 months.   Nutrition-Focused physical exam completed. Findings are no fat depletion, no muscle depletion, and mild edema. Pt was sedentary PTA and pt wife reports she is trying to help pt become more active to promote a healthier lifestyle. 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 Discoloration was not noted on rt hand.   Discussed importance of continued good meal intake to support healing. Encouraged continued lifestyle changes to promote management of chronic health conditions. Pt and pt wife report no further nutritional concerns, but expressed appreciation for visit.   Reviewed [facility] note from 05/14/15; pt with black lesion on rt inner ear. Recommending dermatology consult for further evaluation.   Case discussed with RN. He reports that pt reported decreased appetite PTA. Pt worked with PT prior to going down to ERCP and RN reports pt performed well on PT assessment.   Labs reviewed: K: 2.6 (on IV supplementation).   Diet Order:  Diet Carb Modified Fluid consistency:: Thin; Room service appropriate?: Yes  Skin:  Reviewed, no issues  Last BM:  05/15/15  Height:   Ht Readings from Last 1 Encounters:  05/15/15 5\' 11"  (1.803 m)    Weight:   Wt Readings from Last 1 Encounters:  05/15/15 318 lb 12.8 oz (144.607 kg)    Ideal Body Weight:  78.2 kg  BMI:  Body mass index is 44.48 kg/(m^2).  Estimated Nutritional Needs:   Kcal:  2000-2200  Protein:  90-105  grams  Fluid:  2.2-2.0 L  EDUCATION NEEDS:   Education needs addressed  Gavriella Hearst A. Jimmye Norman, RD, LDN, CDE Pager: [phone] After hours Pager: [phone]

## 2015-05-15 NOTE — Progress Notes (Signed)
OT Cancellation Note  Patient Details Name: Ryan Roach MRN: [id number] DOB: [birth year]   Cancelled Treatment:    Reason Eval/Treat Not Completed: Patient at procedure or test/ unavailable. MD in room talking with pt. Will return later as able.   [place], OTR/L  [id number] 05/15/2015 05/15/2015, 9:58 AM

## 2015-05-15 NOTE — Interval H&P Note (Signed)
History and Physical Interval Note:  05/15/2015 12:00 PM  Ryan Roach  has presented today for surgery, with the diagnosis of pancreatic mass, dilated bile ducts, jaundice  The various methods of treatment have been discussed with the patient and family. After consideration of risks, benefits and other options for treatment, the patient has consented to  Procedure(s): ENDOSCOPIC RETROGRADE CHOLANGIOPANCREATOGRAPHY (ERCP) (N/A) as a surgical intervention .  The patient's history has been reviewed, patient examined, no change in status, stable for surgery.  I have reviewed the patient's chart and labs.  Questions were answered to the patient's satisfaction.     [name]

## 2015-05-15 NOTE — Op Note (Signed)
[facility] Patient Name: Ryan Roach Procedure Date : 05/15/2015 MRN: [id number] Attending MD: Estill Cotta. [name] , MD Date of Birth: 28-Aug-1945 CSN: [id number] Age: 70 Admit Type: Inpatient Procedure:                ERCP Indications:              Biliary dilation on Computed Tomogram Scan, Jaundice Providers:                Mallie Mussel L. [name], MD, Cleda Daub, RN, Ralene Bathe,                            Technician Referring MD:              Medicines:                General Anesthesia Complications:            No immediate complications. Estimated Blood Loss:     Estimated blood loss: none. Procedure:                Pre-Anesthesia Assessment:                           - Prior to the procedure, a History and Physical                            was performed, and patient medications and                            allergies were reviewed. The patient's tolerance of                            previous anesthesia was also reviewed. The risks                            and benefits of the procedure and the sedation                            options and risks were discussed with the patient.                            All questions were answered, and informed consent                            was obtained. Prior Anticoagulants: The patient has                            taken aspirin, last dose was 3 days prior to                            procedure. ASA Grade Assessment: III - A patient                            with severe systemic disease. After reviewing the  risks and benefits, the patient was deemed in                            satisfactory condition to undergo the procedure.                           After obtaining informed consent, the scope was                            passed under direct vision. Throughout the                            procedure, the patient's blood pressure, pulse, and                            oxygen saturations were  monitored continuously. The                            [id number]) scope was introduced through                            the mouth, and used to inject contrast into and                            used to inject contrast into the bile duct. The                            ERCP was accomplished without difficulty. The                            patient tolerated the procedure well. Scope In: Scope Out: Findings:      The scout film was normal. The esophagus was successfully intubated       under direct vision. The scope was advanced to a normal major papilla in       the descending duodenum without detailed examination of the pharynx,       larynx and associated structures, and upper GI tract. The upper GI tract       was grossly normal. The bile duct was deeply cannulated with the       Autotome sphincterotome. Contrast was injected. I personally interpreted       the bile duct images. There was brisk flow of contrast through the       ducts. Image quality was excellent. Contrast extended to the hepatic       ducts. The upper third of the main bile duct and left and right hepatic       ducts and all intrahepatic branches were diffusely dilated. The largest       diameter was 15 mm. The distal half of the common bile duct contained a       single moderate stenosis approximately 30 mm in length. The catheter       passed without resistance, as did the eventual stent. The middle third       of the main bile duct contained one mobile filling defect consistent       with a stone, which was 10  mm in diameter. 0.035 inch x 260 cm straight       Hydra Jagwire passed successfully into the left main hepatic duct. An 8       mm biliary sphincterotomy was made with a traction (standard)       sphincterotome using ERBE electrocautery. There was no       post-sphincterotomy bleeding. The biliary tree was swept with a 12 mm       balloon starting at the upper third of the main bile duct. With  several       passes, the stones was crushed and the debris was removed. Of note, the       debris was mostly white and only mildly bile stained. No stones       remained. Cells for cytology were obtained by brushing. One 10 Fr by 7       cm plastic stent was placed 6 cm into the common bile duct. Clear       contrast flowed through the stent. After several minutes, bile was still       not seen flowing from the stent. However, the stent was in good position       endoscopically and radiographically. Indomethacin 100 mg was given via       suppository to decrease the risk of post-ERCP pancreatitis (PEP). 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                           - The upper third of the main bile duct and left                            and right hepatic ducts and all intrahepatic                            branches were dilated.                           - Choledocholithiasis was found. Complete removal                            was accomplished by biliary sphincterotomy and                            balloon extraction.                           - A biliary sphincterotomy was performed.                           - The biliary tree was swept.                           - One plastic stent was placed into the common bile  duct.                           - Indomethacin given to decrease risk of post-ERCP                            pancreatitis. Moderate Sedation:      GETA Recommendation:           - Observe patient's clinical course in hospital at                            least until tomorrow.                           - Serologic workup for intrinsic liver disease                            (i.e. AMA, ASMA, ANA, hepatitis serology)                           EUS in near future to evaluate for possible                            pancreatic head mass. Procedure Code(s):         --- Professional ---                           [phone], Endoscopic retrograde                            cholangiopancreatography (ERCP); with placement of                            endoscopic stent into biliary or pancreatic duct,                            including pre- and post-dilation and guide wire                            passage, when performed, including sphincterotomy,                            when performed, each stent                           [id number], Endoscopic retrograde                            cholangiopancreatography (ERCP); with removal of                            calculi/debris from biliary/pancreatic duct(s) Diagnosis Code(s):        --- Professional ---                           K80.51, Calculus of bile duct without cholangitis  or cholecystitis with obstruction                           K83.8, Other specified diseases of biliary tract                           R17, Unspecified jaundice CPT copyright 2016 American Medical Association. All rights reserved. The codes documented in this report are preliminary and upon coder review may  be revised to meet current compliance requirements. Jevaeh Shams L. [name], MD 05/15/2015 2:33:08 PM This report has been signed electronically. Number of Addenda: 0

## 2015-05-15 NOTE — Evaluation (Addendum)
Physical Therapy Evaluation Patient Details Name: Ryan Roach MRN: [id number] DOB: Apr 25, 1945 Today's Date: 05/15/2015   History of Present Illness  Patient is a 70 y/o male with hx of arthritis who presents with jaundice, abdominal pain. 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 Workup in progress. Scheduled for ERCP today, 4/6.  Clinical Impression  Patient presents with generalized weakness, fatigue, impaired balance, endurance and deconditioning impacting mobility. Tolerated short distance ambulation with Min guard assist for safety. Recommend use of RW at all times at home instead of [facility]. Pt has support from wife. 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 Will follow acutely to maximize independence and mobility prior to return home.     Follow Up Recommendations Supervision - Intermittent;No PT follow up    Equipment Recommendations  None recommended by PT    Recommendations for Other Services       Precautions / Restrictions Precautions Precautions: Fall Restrictions Weight Bearing Restrictions: No      Mobility  Bed Mobility Overal bed mobility: Needs Assistance Bed Mobility: Supine to Sit;Sit to Supine     Supine to sit: Modified independent (Device/Increase time);HOB elevated Sit to supine: Modified independent (Device/Increase time);HOB elevated   General bed mobility comments: Heavy use of rail.   Transfers Overall transfer level: Needs assistance Equipment used: Rolling walker (2 wheeled) Transfers: Sit to/from Stand Sit to Stand: Min guard         General transfer comment: Some difficulty standing with a few attempts. Stood from [employer], from chair x1. Transferred to chair. Min guard for safety.   Ambulation/Gait Ambulation/Gait assistance: Min guard Ambulation Distance (Feet): 40 Feet Assistive device: Rolling walker (2 wheeled) Gait Pattern/deviations: Decreased stride length;Step-through pattern;Trunk flexed;Antalgic Gait  velocity: decreased   General Gait Details: MIldly unsteady gait with cues to keep RW on ground, slightly impulsive. HR up to 113 bpm.  Stairs            Wheelchair Mobility    Modified Rankin (Stroke Patients Only)       Balance Overall balance assessment: Needs assistance Sitting-balance support: Feet supported;No upper extremity supported Sitting balance-Leahy Scale: Good Sitting balance - Comments: Not able to reach down to feet. Posterior lean with LE exercises   Standing balance support: During functional activity Standing balance-Leahy Scale: Fair Standing balance comment: Requires UE support in standing.                             Pertinent Vitals/Pain Pain Assessment: No/denies pain    Home Living Family/patient expects to be discharged to:: Private residence Living Arrangements: Spouse/significant other Available Help at Discharge: Family;Available 24 hours/day Type of Home: House Home Access: Stairs to enter Entrance Stairs-Rails: None Entrance Stairs-Number of Steps: 2 Home Layout: One level Home Equipment: Walker - 2 wheels;Cane - single point      Prior Function Level of Independence: Needs assistance   Gait / Transfers Assistance Needed: Uses SPC vs RW for ambulation. Does not do much walking.   ADL's / Homemaking Assistance Needed: Assist with LB dressing.  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    Exercises        Assessment/Plan    PT Assessment Patient needs continued PT services  PT Diagnosis Difficulty walking;Generalized weakness   PT Problem List Decreased strength;Cardiopulmonary status limiting activity;Decreased activity tolerance;Decreased balance;Decreased mobility;Decreased knowledge of use of DME  PT Treatment Interventions Balance training;Gait training;Stair training;Functional mobility training;Therapeutic activities;Therapeutic exercise;Patient/family education   PT Goals (Current goals can be found in the Care Plan section) Acute Rehab PT Goals Patient Stated Goal: to return home PT Goal Formulation: With patient Time For Goal Achievement: 05/29/15 Potential to Achieve Goals: Good    Frequency Min 3X/week   Barriers to discharge        Co-evaluation               End of Session Equipment Utilized During Treatment: Gait belt Activity Tolerance: Patient tolerated treatment well;Patient limited by fatigue Patient left: in bed;with call bell/phone within reach;with nursing/sitter in room;with family/visitor present Nurse Communication: Mobility status         Time: [id number] PT Time Calculation (min) (ACUTE ONLY): 20 min   Charges:   PT Evaluation $PT Eval Moderate Complexity: 1 Procedure     PT G Codes:        Enzo Treu A Lewanna Petrak 05/15/2015, 11:34 AM Wray Kearns, PT, DPT [phone]

## 2015-05-15 NOTE — Anesthesia Preprocedure Evaluation (Addendum)
Anesthesia Evaluation  Patient identified by MRN, date of birth, ID band Patient awake    Reviewed: Allergy & Precautions, H&P , NPO status , Patient's Chart, lab work & pertinent test results  Airway Mallampati: II  TM Distance: >3 FB Neck ROM: Full    Dental no notable dental hx.    Pulmonary neg pulmonary ROS,    Pulmonary exam normal breath sounds clear to auscultation       Cardiovascular Exercise Tolerance: Good Normal cardiovascular exam Rhythm:Regular Rate:Normal  CXR: cardiomegaly without acute disease.  ECG:NSR.  iRBBB   Neuro/Psych negative neurological ROS  negative psych ROS   GI/Hepatic negative GI ROS, Neg liver ROS,   Endo/Other  Morbid obesity  Renal/GU negative Renal ROS     Musculoskeletal  (+) Arthritis ,   Abdominal (+) + obese,   Peds  Hematology negative hematology ROS (+) anemia ,   Anesthesia Other Findings   Reproductive/Obstetrics negative OB ROS                             Anesthesia Physical  Anesthesia Plan  ASA: III  Anesthesia Plan: General   Post-op Pain Management:    Induction: Intravenous  Airway Management Planned: Oral ETT  Additional Equipment:   Intra-op Plan:   Post-operative Plan: Extubation in OR  Informed Consent: I have reviewed the patients History and Physical, chart, labs and discussed the procedure including the risks, benefits and alternatives for the proposed anesthesia with the patient or authorized representative who has indicated his/her understanding and acceptance.   Dental advisory given  Plan Discussed with: CRNA  Anesthesia Plan Comments:         Anesthesia Quick Evaluation

## 2015-05-15 NOTE — H&P (View-Only) (Signed)
[facility] Consult: 9:02 AM 05/14/2015  LOS: 0 days    Referring Provider: Dr Marily Memos  Primary Care Physician:  No primary care provider on file. Primary Gastroenterologist:  unassigned  Wife Sondra Dirden  [phone].   Reason for Consultation:  Obstructive jaundice.    HPI: Ryan Roach is a 70 y.o. male.  no regular medical care.  Obese.    Hx knee surgery, tonsillectomy and arthritis.  Had benign screening colonoscopy in [place] ` 2007.     3 to 4 weeks of dark urine and progressive jaundice, pruritus, weakness, epigastric pain, FTT, abdominal swelling, pale stools, milld LUQ discomfort but not pain, anorexia, "black" urine, somnolence, some confusion.  Some stool and urinary incontinence is new.   Family noticed the jaundice started 1 week ago.  He has not touched ETOH since his teens, early 47s.  No hx liver issues.  Labs with T bili of 14.7, alk phos 535, AST/ALT 152/78.  Ammonia 48.  coags 21/1.8.  K 2.7, normal BUN/Creat. CBC ok.   Ultrasound: GB mildly distended, small gallstone.  heterogeneous liver.  Dilated intra and extrahepatic ducts, 11 mm CBD.  CT scan:  2 cm mass in head of pancreas, CBD 1.7 cm and intrahepatic duct dilatation, abnormal liver parenchyma.        Past Medical History  Diagnosis Date  . Arthritis     Past Surgical History  Procedure Laterality Date  . Tonsillectomy    . 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 Authorizing Provider  acetaminophen (TYLENOL) 500 MG tablet Take 500 mg by mouth 3 (three) times daily. Take every day per patient   Yes  Historical Provider, MD  aspirin 325 MG tablet Take 325 mg by mouth daily.   Yes Historical Provider, MD  ferrous sulfate 325 (65 FE) MG tablet Take 1 tablet (325 mg total) by mouth 3 (three) times daily after meals. Patient not taking: Reported on 05/13/2015 05/26/12   Latanya Maudlin, MD  methocarbamol (ROBAXIN) 500 MG tablet Take 1 tablet (500 mg total) by mouth every 6 (six) hours as needed. Patient not taking: Reported on 05/13/2015 05/25/12   Latanya Maudlin, MD  oxyCODONE-acetaminophen (PERCOCET/ROXICET) 5-325 MG per tablet Take 1-2 tablets by mouth every 4 (four) hours as needed. Patient not taking: Reported on 05/13/2015 05/25/12   Latanya Maudlin, MD  polyethylene glycol [facility] / Floria Raveling) packet Take 17 g by mouth daily as needed. Patient not taking: Reported on 05/13/2015 05/25/12   Latanya Maudlin, MD  rivaroxaban (XARELTO) 10 MG TABS tablet Take 1 tablet (10 mg total) by mouth daily with breakfast. Patient not taking: Reported on 05/13/2015 05/26/12   Latanya Maudlin, MD    Scheduled Meds: . diphenhydrAMINE  12.5 mg Intravenous Once  . potassium chloride  20 mEq Oral Daily   Infusions: . sodium chloride  PRN Meds: acetaminophen **OR** acetaminophen, HYDROcodone-acetaminophen, morphine injection, ondansetron **OR** ondansetron (ZOFRAN) IV, traZODone   Allergies as of 05/13/2015  . (No Known Allergies)    Family History  Problem Relation Age of Onset  . Breast cancer Mother     Social History   Social History  . Marital Status: Married    Spouse Name: N/A  . Number of Children: N/A  . Years of Education: N/A   Occupational History  . Not on file.   Social History Main Topics  . Smoking status: Never Smoker   . Smokeless tobacco: Current User    Types: Chew  . Alcohol Use: No  . Drug Use: No  . Sexual Activity: Not on file   Other Topics Concern  . Not on file   Social History Narrative    REVIEW OF SYSTEMS: Constitutional:  Per HPI.  Weakness, sleeping a lot.   No weight gain or loss but belly getting bigger ENT:  No nose bleeds Pulm:  No cough or dyspnea CV:  No palpitations, no LE edema. No chest pain.  GU:  No hematuria, no frequency GI:  Per HPI Heme:  No unusual bleeding or bruising   Transfusions:  None  Neuro:  No headaches, no peripheral tingling or numbness Derm:  No itching, no rash or sores.  Endocrine:  No sweats or chills.  No polyuria or dysuria Immunization:  Not recently.     PHYSICAL EXAM: Vital signs in last 24 hours: Filed Vitals:   05/14/15 0630 05/14/15 0730  BP: 135/67 128/56  Pulse: 99 97  Temp:    Resp:     Wt Readings from Last 3 Encounters:  05/26/12 146.4 kg (322 lb 12.1 oz)  05/19/12 143.79 kg (317 lb)    General: obese, jaundiced, unwell. WM Head:  No swelling or asymmetry.    Eyes:  No conj pallor.  + icterus Ears:  Lesion on right ear is crusted with dry blood  Nose:  No lesions or discharge Mouth:  Clear and moist.  Full dentures in place Neck:  No mass, no JVD Lungs:  Clear bil.  No cough.  Heart: RRR.  No MRG.  S1/S2 audible Abdomen:  Obese, no mass, hypoactive BS, protuberant.  Not tender.   Rectal: deferred   GU:  No scrotal edema.  Musc/Skeltl: no joint erythema or swelling.  Extremities: fat legs but no pedal or ankle edema and no pitting  Neurologic:  Oriented x 3.  No asterixis.  Moves all 4s.  Drowsy and delays in answering questions.  Skin:  Excoriation marks on lower legs, on upper back and right upper chest.  telangectasias on chest.  Nodes:  No cervical adenopathy   Psych:  Pleasant but drowsy.  Cooperative, affect bland and depressed.   Intake/Output from previous day:   Intake/Output this shift:    LAB RESULTS:  Recent Labs  05/13/15 2201  WBC 8.3  HGB 12.9*  HCT 39.2  PLT 437*   BMET Lab Results  Component Value Date   NA 138 05/13/2015   NA 133* 05/27/2012   NA 136 05/26/2012   K 2.7* 05/13/2015   K 4.0 05/27/2012   K 4.0 05/26/2012   CL 103 05/13/2015     CL 98 05/27/2012   CL 101 05/26/2012   CO2 21* 05/13/2015   CO2 30 05/27/2012   CO2 26 05/26/2012   GLUCOSE 111* 05/13/2015   GLUCOSE 112* 05/27/2012   GLUCOSE 125* 05/26/2012   BUN 14  05/13/2015   BUN 15 05/27/2012   BUN 15 05/26/2012   CREATININE 0.67 05/13/2015   CREATININE 0.66 05/27/2012   CREATININE 0.62 05/26/2012   CALCIUM 8.4* 05/13/2015   CALCIUM 8.3* 05/27/2012   CALCIUM 8.1* 05/26/2012   LFT  Recent Labs  05/13/15 2201  PROT 7.6  ALBUMIN 2.5*  AST 152*  ALT 78*  ALKPHOS 535*  BILITOT 14.7*   PT/INR Lab Results  Component Value Date   INR 1.82* 05/14/2015   INR 1.00 05/19/2012   Hepatitis Panel No results for input(s): HEPBSAG, HCVAB, HEPAIGM, HEPBIGM in the last 72 hours. C-Diff No components found for: CDIFF Lipase     Component Value Date/Time   LIPASE 35 05/13/2015 2201    Drugs of Abuse  No results found for: LABOPIA, COCAINSCRNUR, LABBENZ, AMPHETMU, THCU, LABBARB   RADIOLOGY STUDIES: US Abdomen Complete  05/14/2015  CLINICAL DATA:  Jaundice for 3 days, question obstruction. Abdominal discomfort. EXAM: ABDOMEN ULTRASOUND COMPLETE COMPARISON:  None. FINDINGS: Gallbladder: Mildly distended with a small gallstone. No wall thickening visualized. No sonographic Murphy sign noted by sonographer. Common bile duct: Diameter: 11 mm at the porta hepatis. Not visualized distally. Liver: Diffuse increased and heterogeneous in echogenicity. Liver parenchyma is difficult to penetrate. No gross focal lesion. Normal directional flow in the main portal vein. There is central intrahepatic biliary ductal dilatation. IVC: No abnormality visualized. Pancreas: Not well visualized due to body habitus and bowel gas. Spleen: Size and appearance within normal limits were visualized. Right Kidney: Length: 13.4 cm. Echogenicity within normal limits. No mass or hydronephrosis visualized. Left Kidney: Length: 12.6 cm. Echogenicity within normal limits. No mass or hydronephrosis  visualized. Abdominal aorta: Majority obscured due to bowel gas and habitus. No aneurysm visualized proximally, mid and distal obscured. Other findings: None. Technically challenging exam due to body habitus and bowel gas. IMPRESSION: 1. Intra and extrahepatic biliary ductal dilatation. Mild gallbladder distention. Findings concerning for distal obstruction, etiology not visualized sonographically. Further evaluation is warranted with CT or MRCP. 2. Increased hepatic echogenicity, likely secondary to steatosis, liver parenchyma difficult to penetrate. 3. Limited assessment of midline structures. Electronically Signed   By: Jeb Levering M.D.   On: 05/14/2015 01:45   Ct Abdomen Pelvis W Contrast  05/14/2015  CLINICAL DATA:  Right upper quadrant abdominal tenderness in jaundice. Biliary dilatation on ultrasound. EXAM: CT ABDOMEN AND PELVIS WITH CONTRAST TECHNIQUE: Multidetector CT imaging of the abdomen and pelvis was performed using the standard protocol following bolus administration of intravenous contrast. CONTRAST:  148m ISOVUE-300 IOPAMIDOL (ISOVUE-300) INJECTION 61% COMPARISON:  Abdominal ultrasound earlier this day. FINDINGS: Lower chest: Heart at the upper limits of normal in size. Coronary artery calcifications. No pleural effusion. No consolidation. Liver: Enlarged of decreased density. No focal lesion. Mild motion artifact partially obscures inferior liver. Hepatobiliary: Intra and extrahepatic biliary ductal dilatation. Gallbladder is distended. Mild gallbladder wall thickening. Common bile duct measures up to 1.7 cm in the midportion. No calcified choledocholithiasis. Mild motion artifact. Pancreas: Questionable hypodense lesion in the head measuring 2 cm. No pancreatic ductal dilatation. No peripancreatic inflammation. Mild motion artifact. Spleen: Normal. Adrenal glands: No nodule. Kidneys: Symmetric renal enhancement and excretion. No hydronephrosis. Stomach/Bowel: Stomach physiologically  distended. There are no dilated or thickened small bowel loops. Moderate volume of stool throughout the colon without colonic wall thickening. There is colonic tortuosity. The appendix is normal. Vascular/Lymphatic: No retroperitoneal adenopathy. Abdominal aorta is normal in caliber. Moderate atherosclerosis without aneurysm. Reproductive: Enlarged prostate gland measuring 6.8 cm transverse.  Small bilateral external iliac nodes, all subcentimeter in size. Bladder: Physiologically distended, no wall thickening. Other: No free air, free fluid, or intra-abdominal fluid collection. Fat containing umbilical hernia. Musculoskeletal: There are no acute or suspicious osseous abnormalities. Multilevel degenerative change throughout the spine. Degenerative change of both hips. IMPRESSION: 1. 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 This is incompletely characterized. MRCP recommended if patient is able to tolerate breath hold technique. 2. Incidental finding of prostatomegaly. Atherosclerosis including coronary artery calcifications. Electronically Signed   By: Jeb Levering M.D.   On: 05/14/2015 05:59    ENDOSCOPIC STUDIES: none  IMPRESSION:   *  Obstructive jaundice with ? Pancreatic mass.    *  Elevated ammonia level.    *  Fatty and heterogenous liver on imaging, telangectasia on skin, increased ammonia:  Suggests cirrhosis in obese, nondrinker.   *  Coagulopathy.    *  Hypokalemia.    *  CAD by CT scan.    PLAN:     *  Has ERCP set up for noon tomorrow. Discussed pt , dtr's and wife. Eat today.   *  Vitamin K SQ now.  Coags, CMET ordered for AM   Azucena Freed  05/14/2015, 9:02 AM Pager: [phone]

## 2015-05-15 NOTE — Telephone Encounter (Signed)
Dr [name] does this pt still need to be set up for EUS/ERCP?

## 2015-05-15 NOTE — Progress Notes (Signed)
MD paged for critical potassium level of 2.6

## 2015-05-15 NOTE — Telephone Encounter (Signed)
Yes.  He will probably go home over the weekend.  Wife is aware that she will hear from you

## 2015-05-16 ENCOUNTER — Other Ambulatory Visit: Payer: Self-pay

## 2015-05-16 ENCOUNTER — Inpatient Hospital Stay (HOSPITAL_COMMUNITY): Payer: PPO

## 2015-05-16 ENCOUNTER — Other Ambulatory Visit (HOSPITAL_COMMUNITY): Payer: PPO

## 2015-05-16 DIAGNOSIS — K838 Other specified diseases of biliary tract: Secondary | ICD-10-CM | POA: Diagnosis not present

## 2015-05-16 DIAGNOSIS — R5381 Other malaise: Secondary | ICD-10-CM | POA: Diagnosis not present

## 2015-05-16 DIAGNOSIS — K831 Obstruction of bile duct: Secondary | ICD-10-CM

## 2015-05-16 DIAGNOSIS — R9431 Abnormal electrocardiogram [ECG] [EKG]: Secondary | ICD-10-CM | POA: Diagnosis not present

## 2015-05-16 DIAGNOSIS — R17 Unspecified jaundice: Secondary | ICD-10-CM | POA: Diagnosis present

## 2015-05-16 DIAGNOSIS — K8051 Calculus of bile duct without cholangitis or cholecystitis with obstruction: Secondary | ICD-10-CM | POA: Diagnosis not present

## 2015-05-16 DIAGNOSIS — E876 Hypokalemia: Secondary | ICD-10-CM | POA: Diagnosis not present

## 2015-05-16 DIAGNOSIS — R740 Nonspecific elevation of levels of transaminase and lactic acid dehydrogenase [LDH]: Secondary | ICD-10-CM | POA: Diagnosis not present

## 2015-05-16 DIAGNOSIS — K869 Disease of pancreas, unspecified: Secondary | ICD-10-CM | POA: Diagnosis not present

## 2015-05-16 DIAGNOSIS — N400 Enlarged prostate without lower urinary tract symptoms: Secondary | ICD-10-CM | POA: Diagnosis not present

## 2015-05-16 LAB — COMPREHENSIVE METABOLIC PANEL
ALK PHOS: 458 U/L — AB (ref 38–126)
ALT: 73 U/L — AB (ref 17–63)
ANION GAP: 10 (ref 5–15)
AST: 173 U/L — ABNORMAL HIGH (ref 15–41)
Albumin: 2 g/dL — ABNORMAL LOW (ref 3.5–5.0)
BUN: 7 mg/dL (ref 6–20)
CALCIUM: 8 mg/dL — AB (ref 8.9–10.3)
CO2: 25 mmol/L (ref 22–32)
CREATININE: 0.52 mg/dL — AB (ref 0.61–1.24)
Chloride: 104 mmol/L (ref 101–111)
Glucose, Bld: 103 mg/dL — ABNORMAL HIGH (ref 65–99)
Potassium: 3.4 mmol/L — ABNORMAL LOW (ref 3.5–5.1)
SODIUM: 139 mmol/L (ref 135–145)
Total Bilirubin: 16.6 mg/dL — ABNORMAL HIGH (ref 0.3–1.2)
Total Protein: 6.1 g/dL — ABNORMAL LOW (ref 6.5–8.1)

## 2015-05-16 LAB — ECHOCARDIOGRAM COMPLETE
HEIGHTINCHES: 71 in
Weight: 5100.8 oz

## 2015-05-16 LAB — CANCER ANTIGEN 19-9: CA 19-9: 1853 U/mL — ABNORMAL HIGH (ref 0–35)

## 2015-05-16 LAB — GLUCOSE, CAPILLARY: GLUCOSE-CAPILLARY: 89 mg/dL (ref 65–99)

## 2015-05-16 MED ORDER — LACTULOSE 10 GM/15ML PO SOLN
20.0000 g | Freq: Two times a day (BID) | ORAL | Status: DC
  Administered 2015-05-17 – 2015-05-19 (×5): 20 g via ORAL
  Filled 2015-05-16 (×5): qty 30

## 2015-05-16 MED ORDER — DIPHENHYDRAMINE HCL 25 MG PO CAPS
25.0000 mg | ORAL_CAPSULE | Freq: Once | ORAL | Status: AC
Start: 2015-05-16 — End: 2015-05-16
  Administered 2015-05-16: 25 mg via ORAL
  Filled 2015-05-16: qty 1

## 2015-05-16 MED ORDER — TAMSULOSIN HCL 0.4 MG PO CAPS
0.4000 mg | ORAL_CAPSULE | Freq: Every day | ORAL | Status: DC
  Administered 2015-05-16 – 2015-05-18 (×3): 0.4 mg via ORAL
  Filled 2015-05-16 (×3): qty 1

## 2015-05-16 NOTE — Progress Notes (Signed)
PT Cancellation Note  Patient Details Name: Ryan Roach MRN: [id number] DOB: 12-25-1945   Cancelled Treatment:    Reason Eval/Treat Not Completed: Patient declined, no reason specified Pt reported he just finished OT and would like to eat breakfast before more therapy. PT will check on pt later as time allows.    Salina April, PTA Pager: [phone]   05/16/2015, 9:29 AM

## 2015-05-16 NOTE — Progress Notes (Signed)
Physical Therapy Treatment Patient Details Name: Ryan Roach MRN: [id number] DOB: 23-Feb-1945 Today's Date: 05/16/2015    History of Present Illness Patient is a 70 y/o male with hx of arthritis who presents with jaundice, abdominal pain. 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 Workup in progress    PT Comments    Patient is making progress toward mobility goals. Patient needs to practice stairs next session.     Follow Up Recommendations  Supervision - Intermittent;No PT follow up     Equipment Recommendations  None recommended by PT    Recommendations for Other Services       Precautions / Restrictions Precautions Precautions: Fall Restrictions Weight Bearing Restrictions: No    Mobility  Bed Mobility               General bed mobility comments: OOB in chair upon arrival  Transfers Overall transfer level: Needs assistance Equipment used: Rolling walker (2 wheeled) Transfers: Sit to/from Stand Sit to Stand: Supervision         General transfer comment: supervision for safety; cues for hand placement and technique with pt sitting prematurely when returning to recliner  Ambulation/Gait Ambulation/Gait assistance: Supervision Ambulation Distance (Feet): 85 Feet Assistive device: Rolling walker (2 wheeled) Gait Pattern/deviations: Step-through pattern;Decreased stride length;Antalgic;Trunk flexed;Wide base of support Gait velocity: decreased   General Gait Details: cues for posture, safe use of AD, and position of RW; a little unsteady with turns   Science writer    Modified Rankin (Stroke Patients Only)       Balance     Sitting balance-Leahy Scale: Good       Standing balance-Leahy Scale: Fair                      Cognition Arousal/Alertness: Awake/alert Behavior During Therapy: WFL for tasks assessed/performed Overall Cognitive Status: Within Functional Limits for tasks assessed                       Exercises General Exercises - Lower Extremity Ankle Circles/Pumps: AROM;Both;15 reps;Seated    General Comments General comments (skin integrity, edema, etc.): wife present for session; pt declined further mobility due to recently taking laxative and wants to stay close to restroom; encouraged to ambulate with nursing staff and reviewed LE exercises       Pertinent Vitals/Pain Pain Assessment: 0-10 Pain Score: 1  Pain Location: L knee Pain Descriptors / Indicators: Aching;Sore Pain Intervention(s): Monitored during session;Repositioned    Home Living                      Prior Function            PT Goals (current goals can now be found in the care plan section) Acute Rehab PT Goals Patient Stated Goal: go home Progress towards PT goals: Progressing toward goals    Frequency  Min 3X/week    PT Plan Current plan remains appropriate    Co-evaluation             End of Session Equipment Utilized During Treatment: Gait belt Activity Tolerance: Patient tolerated treatment well Patient left: with call bell/phone within reach;with nursing/sitter in room;with family/visitor present;in chair     Time: 1400-1416 PT Time Calculation (min) (ACUTE ONLY): 16 min  Charges:  $Gait Training: 8-22 mins  G Codes:      Salina April, PTA Pager: [phone]   05/16/2015, 2:30 PM

## 2015-05-16 NOTE — Anesthesia Postprocedure Evaluation (Signed)
Anesthesia Post Note  Patient: Ryan Roach  Procedure(s) Performed: Procedure(s) (LRB): ENDOSCOPIC RETROGRADE CHOLANGIOPANCREATOGRAPHY (ERCP) (N/A)  Patient location during evaluation: PACU Anesthesia Type: General Level of consciousness: sedated and patient cooperative Pain management: pain level controlled Vital Signs Assessment: post-procedure vital signs reviewed and stable Respiratory status: spontaneous breathing Cardiovascular status: stable Anesthetic complications: no    Last Vitals:  Filed Vitals:   05/15/15 2110 05/16/15 0524  BP: 135/56 128/46  Pulse: 85 77  Temp: 36.9 C 37.1 C  Resp: 18 18    Last Pain:  Filed Vitals:   05/16/15 0752  PainSc: Asleep                 Nolon Nations

## 2015-05-16 NOTE — Telephone Encounter (Signed)
Dr [name] the pt is on xarelto I will send a letter to cardiology regarding holding prior to procedure.

## 2015-05-16 NOTE — Progress Notes (Signed)
  Echocardiogram 2D Echocardiogram has been performed.  Ryan Roach 05/16/2015, 2:11 PM

## 2015-05-16 NOTE — Telephone Encounter (Signed)
The pt is still currently hospitalized.  EUS scheduled, Amy Esterwood PA called and given the date and time and medications reviewed with Amy.  Amy will make pt aware and put the info in his hospital chart.  Patient instructions mailed to home.  Patient to call with any questions or concerns.

## 2015-05-16 NOTE — Progress Notes (Signed)
[facility] PROGRESS NOTE  Ryan Roach [id number] DOB: Apr 30, 1945 DOA: 05/13/2015 PCP: No primary care provider on file.  Brief narrative 70 year old obese male with no prior medical history, not seeked regular medical care presented to  the ED with re-4 weeks of dark urine, progressive jaundice, generalized itching, epigastric discomfort, weakness and abdominal distention. He reported passing pale stool, poor appetite and increased sleepiness. Wife noticed that patient was increasingly jaundiced for the past 1 week. Patient doesn't smoke or drink. In the ED workup showed the range LFTs with total bilirubin of 14.7, AST/ALT of 152/78, alkaline phosphatase of 535, INR of 1.82 and ammonia 48. Ultrasound abdomen showed intra-and except hepatic biliary dilatation concerning for distal obstruction with increased hepatic echogenicity. CT abdomen and pelvis showed a 2 cm pancreatic head mass with intra-and except hepatic biliary dilatation. Liver was found to be enlarged without focal lesion. Patient admitted to hospitalist service and [facility] consulted.    Assessment/Plan: Obstructive jaundice secondary to pancreatic head mass -CT abdomen and abdominal ultrasound findings as above. CT chest without thoracic metastases.  -Markedly elevated CEA, CA 19-9 and LDH. ERCP done showed moderate biliary stricture in the distal half of CBD with dilated upper third of May in bilateral, left and right hepatic ducts and also intrahepatic branches. Also found choledocholithiasis which was removed followed by biliary spent enterotomy and balloon extraction. One plastic stent was placed into the common bile duct. GI suggested that patient is possibly need EUS with biopsy and possible metal stent placement as outpatient. Will need oncology evaluation once tissue diagnosis obtained. (Patient does not have a PCP.  I will establish care with oncology prior to discharge). -Left is have not improved this morning.  Patient denies any pain or itching. Will monitor for another day to see for his LFTs to trend down. -INR improved after receiving vitamin K on admission.   Nonhealing right ear wound Has an eschar over right ear. No drainage. Wound care consulted. Lesion present for about a year with episodes of bleeding. Patient will need evaluation by dermatologist for biopsy as outpatient.  Severe hypokalemia Replenished with IV potassium and fluids. Magnesium normal.  Malnutrition and deconditioning Nutrition and PT evaluation.  Enlarged prostate Incidentally seen on imaging. Reports urinary urgency, hesitancy, and some nocturia. Add flomax.  DVT prophylaxis: SCD's  Diet: regular  Code Status:  full code Family Communication: Wife at bedside Disposition Plan: Home tomorrow if LFTs mproving   Consultants:  [facility]  Procedures:  CT abdomen and pelvis  ERCP with biliary stenting    Antibiotics:  None  HPI/Subjective: Seen and examined. No abdominal pain. Itching better.  Objective: Filed Vitals:   05/16/15 0524 05/16/15 1354  BP: 128/46 138/54  Pulse: 77 88  Temp: 98.8 F (37.1 C) 98.2 F (36.8 C)  Resp: 18 18    Intake/Output Summary (Last 24 hours) at 05/16/15 1613 Last data filed at 05/16/15 1300  Gross per 24 hour  Intake 1751.67 ml  Output    700 ml  Net 1051.67 ml   Filed Weights   05/15/15 1552  Weight: 144.607 kg (318 lb 12.8 oz)    Exam:   General:   not in distress  HEENT: icteric, moist mucosa  Cardiovascular: Normal S1 and S2, no murmurs rub or gallop  Respiratory: Clear bilaterally, no added sounds  Abdomen: Soft, mild distention,  nontender, bowel sounds present  Musculoskeletal: Warm, no edema, jaundiced skin     Data Reviewed: Basic Metabolic Panel:  Recent  Labs Lab 05/13/15 2201 05/14/15 0902 05/15/15 0623 05/15/15 1221 05/16/15 0549  NA 138  --  142 141 139  K 2.7*  --  2.6* 2.9* 3.4*  CL 103  --  106  --  104  CO2  21*  --  23  --  25  GLUCOSE 111*  --  108* 106* 103*  BUN 14  --  8  --  7  CREATININE 0.67  --  0.44*  --  0.52*  CALCIUM 8.4*  --  8.2*  --  8.0*  MG  --  1.9 2.0  --   --    Liver Function Tests:  Recent Labs Lab 05/13/15 2201 05/15/15 0623 05/16/15 0549  AST 152* 154* 173*  ALT 78* 71* 73*  ALKPHOS 535* 458* 458*  BILITOT 14.7* 16.1* 16.6*  PROT 7.6 6.6 6.1*  ALBUMIN 2.5* 2.2* 2.0*    Recent Labs Lab 05/13/15 2201  LIPASE 35    Recent Labs Lab 05/14/15 0138  AMMONIA 48*   CBC:  Recent Labs Lab 05/13/15 2201 05/15/15 0623 05/15/15 1221  WBC 8.3 8.2  --   HGB 12.9* 11.6* 13.3  HCT 39.2 34.7* 39.0  MCV 88.9 90.4  --   PLT 437* 364  --    Cardiac Enzymes: No results for input(s): CKTOTAL, CKMB, CKMBINDEX, TROPONINI in the last 168 hours. BNP (last 3 results) No results for input(s): BNP in the last 8760 hours.  ProBNP (last 3 results) No results for input(s): PROBNP in the last 8760 hours.  CBG:  Recent Labs Lab 05/16/15 0747  GLUCAP 89    No results found for this or any previous visit (from the past 240 hour(s)).   Studies: Dg Ercp Biliary & Pancreatic Ducts  05/15/2015  CLINICAL DATA:  Sphincterotomy EXAM: ERCP TECHNIQUE: Multiple spot images obtained with the fluoroscopic device and submitted for interpretation post-procedure. FLUOROSCOPY TIME:  Radiation Exposure Index (as provided by the fluoroscopic device): If the device does not provide the exposure index: Fluoroscopy Time:  8 minutes and 42 seconds Number of Acquired Images:  8 COMPARISON:  None. FINDINGS: Contrast fills the biliary tree. A stent has been placed across the distal common bile duct into the duodenum. IMPRESSION: See above. These images were submitted for radiologic interpretation only. Please see the procedural report for the amount of contrast and the fluoroscopy time utilized. Electronically Signed   By: Marybelle Killings M.D.   On: 05/15/2015 16:26    Scheduled Meds: .  lactulose  20 g Oral TID  . potassium chloride  20 mEq Oral Daily  . tamsulosin  0.4 mg Oral QPC supper   Continuous Infusions:      Time spent: 25 minutes    Louellen Molder  [facility] Pager [phone] If 7PM-7AM, please contact night-coverage at [email or web address], password [facility] 05/16/2015, 4:13 PM  LOS: 2 days

## 2015-05-16 NOTE — Progress Notes (Signed)
[facility] Progress Note  Chief Complaint: Jaundice  Subjective History:  He has itching Denies abd pain Appetite good Detailed questioning reveals 100 mg tylenol 2-3 times daily PTA (but does not typically cause cholestatic jaundice) No Abx PTA, no herbal meds  ROS: Cardiovascular:  no chest pain Respiratory: no dyspnea  Objective:  Med list reviewed  Vital signs in last 24 hrs: Filed Vitals:   05/16/15 0524 05/16/15 1354  BP: 128/46 138/54  Pulse: 77 88  Temp: 98.8 F (37.1 C) 98.2 F (36.8 C)  Resp: 18 18    Physical Exam   HEENT: sclera deeply icteric, oral mucosa moist without lesions. + sublingual jaundice  Neck: supple, no thyromegaly, JVD or lymphadenopathy  Cardiac: RRR without murmurs, S1S2 heard, no peripheral edema  Pulm: clear to auscultation bilaterally, normal RR and effort noted  Abdomen: morbidly obese, soft, no tenderness, with active bowel sounds. No guarding or palpable hepatosplenomegaly, but limited by girth  Skin; warm and dry, + deep jaundice  Recent Labs:   Recent Labs Lab 05/13/15 2201 05/15/15 0623 05/15/15 1221  WBC 8.3 8.2  --   HGB 12.9* 11.6* 13.3  HCT 39.2 34.7* 39.0  PLT 437* 364  --     Recent Labs Lab 05/16/15 0549  NA 139  K 3.4*  CL 104  CO2 25  BUN 7  ALBUMIN 2.0*  ALKPHOS 458*  ALT 73*  AST 173*  GLUCOSE 103*    Recent Labs Lab 05/15/15 0623  INR 1.24    AMA, ASMA, pending CA19-9 > 2000  @ASSESSMENTPLANBEGIN @ Assessment:  Jaundice.  Initially appeared obstructive, and still seems to be an element of that.  However, all evidence thus far indicates there is also intrahepatic cholestasis of unclear cause.  Upon further consideration, perhaps he does have cirrhosis.  His INR and NH3 were modestly elevated upon admission.  I attributed it to chronic obstruction, but there appears to be more than one process going on here.  The elevated CA 19-9 is worrisome, but he does NOT have a definite panc  mass on CT.  Pancreatic CA is a possibility but not a certainty in this case.  Plan: Await ASMA, AMA results.   CMP and INR in AM Please keep him until at least early next week, as I think he may need a liver biopsy.  35 minutes spent with him and his family.   [name] Pager [phone] Mon-Fri 8a-5p [phone] after 5p, weekends, holidays

## 2015-05-16 NOTE — Progress Notes (Signed)
Patient ID: Ryan Roach, male   DOB: 02-04-1946, 70 y.o.   MRN: [id number]    Progress Note   Subjective   Feels ok , no c/o pain, had solid food for breakfast which he enjoyed   Objective   Vital signs in last 24 hours: Temp:  [98.2 F (36.8 C)-98.8 F (37.1 C)] 98.8 F (37.1 C) (04/07 0524) Pulse Rate:  [77-90] 77 (04/07 0524) Resp:  [17-21] 18 (04/07 0524) BP: (119-163)/(45-118) 128/46 mmHg (04/07 0524) SpO2:  [97 %-100 %] 97 % (04/07 0524) Weight:  [318 lb 12.8 oz (144.607 kg)] 318 lb 12.8 oz (144.607 kg) (04/06 1552) Last BM Date: 05/15/15 General:   Obese WM in NAD- jaundiced  Heart:  Regular rate and rhythm; no murmurs Lungs: Respirations even and unlabored, lungs CTA bilaterally Abdomen:  Soft,obese  nontender and nondistended. Normal bowel sounds. Extremities:  Without edema. Neurologic:  Alert and oriented,  grossly normal neurologically. Psych:  Cooperative. Normal mood and affect.  Intake/Output from previous day: 04/06 0701 - 04/07 0700 In: 1891.7 [I.V.:1891.7] Out: 400 [Urine:400] Intake/Output this shift:    Lab Results:  Recent Labs  05/13/15 2201 05/15/15 0623 05/15/15 1221  WBC 8.3 8.2  --   HGB 12.9* 11.6* 13.3  HCT 39.2 34.7* 39.0  PLT 437* 364  --    BMET  Recent Labs  05/13/15 2201 05/15/15 0623 05/15/15 1221 05/16/15 0549  NA 138 142 141 139  K 2.7* 2.6* 2.9* 3.4*  CL 103 106  --  104  CO2 21* 23  --  25  GLUCOSE 111* 108* 106* 103*  BUN 14 8  --  7  CREATININE 0.67 0.44*  --  0.52*  CALCIUM 8.4* 8.2*  --  8.0*   LFT  Recent Labs  05/16/15 0549  PROT 6.1*  ALBUMIN 2.0*  AST 173*  ALT 73*  ALKPHOS 458*  BILITOT 16.6*   PT/INR  Recent Labs  05/14/15 0138 05/15/15 0623  LABPROT 21.0* 15.8*  INR 1.82* 1.24    Studies/Results: Dg Ercp Biliary & Pancreatic Ducts  05/15/2015  CLINICAL DATA:  Sphincterotomy EXAM: ERCP TECHNIQUE: Multiple spot images obtained with the fluoroscopic device and submitted for  interpretation post-procedure. FLUOROSCOPY TIME:  Radiation Exposure Index (as provided by the fluoroscopic device): If the device does not provide the exposure index: Fluoroscopy Time:  8 minutes and 42 seconds Number of Acquired Images:  8 COMPARISON:  None. FINDINGS: Contrast fills the biliary tree. A stent has been placed across the distal common bile duct into the duodenum. IMPRESSION: See above. These images were submitted for radiologic interpretation only. Please see the procedural report for the amount of contrast and the fluoroscopy time utilized. Electronically Signed   By: Marybelle Killings M.D.   On: 05/15/2015 16:26       Assessment / Plan:    #1 70 yo WM morbidly obese with obstructive jaundice Stable s/p ERCP/sphincterotomy /stone extraction and stent yesterday - LFT's same -bili16.6 #2  2 cm pancreatic head mass - scheduling of EUS as outpt in progress with Dr Ardis Hughs #3 ?intrinsic liver disease- markers pending  Plan; as above  Observe until LFT's proven to be trending down   Active Problems:   Pancreatic cancer ([place])   Obstructive jaundice due to cancer   Hypokalemia   Enlarged prostate   Non-healing wound or right  ear   Malnutrition (HCC)   Physical deconditioning   Abnormal EKG   Hyperbilirubinemia     LOS: 2 days  Ryan Roach  05/16/2015, 10:36 AM

## 2015-05-16 NOTE — Telephone Encounter (Signed)
Noted Cardiologist aware

## 2015-05-16 NOTE — Telephone Encounter (Signed)
He is not on xarelto.  It was a mistake on his med list.  Please tell cardiologist we're good

## 2015-05-16 NOTE — Progress Notes (Signed)
PT Cancellation Note  Patient Details Name: Ryan Roach MRN: [id number] DOB: 12-07-1945   Cancelled Treatment:    Reason Eval/Treat Not Completed: Patient at procedure or test/unavailable Patient gone for ERCP. PT will check on pt later as time allows.    [place] April, PTA Pager: [phone]   05/16/2015, 1:03 PM

## 2015-05-16 NOTE — Care Management Important Message (Signed)
Important Message  Patient Details  Name: Ryan Roach MRN: [id number] Date of Birth: 02-26-1945   Medicare Important Message Given:  Yes    Nathen May 05/16/2015, 10:29 AM

## 2015-05-16 NOTE — Progress Notes (Signed)
[facility] PROGRESS NOTE  Ryan Roach [id number] DOB: 29-Jun-1945 DOA: 05/13/2015 PCP: No primary care provider on file.  Brief narrative 70 year old obese male with no prior medical history, not seeked regular medical care presented to  the ED with re-4 weeks of dark urine, progressive jaundice, generalized itching, epigastric discomfort, weakness and abdominal distention. He reported passing pale stool, poor appetite and increased sleepiness. Wife noticed that patient was increasingly jaundiced for the past 1 week. Patient doesn't smoke or drink. In the ED workup showed the range LFTs with total bilirubin of 14.7, AST/ALT of 152/78, alkaline phosphatase of 535, INR of 1.82 and ammonia 48. Ultrasound abdomen showed intra-and except hepatic biliary dilatation concerning for distal obstruction with increased hepatic echogenicity. CT abdomen and pelvis showed a 2 cm pancreatic head mass with intra-and except hepatic biliary dilatation. Liver was found to be enlarged without focal lesion. Patient admitted to hospitalist service and [facility] GI consulted.    Assessment/Plan: Obstructive jaundice secondary to pancreatic head mass -CT abdomen and abdominal ultrasound findings as above. CT chest without thoracic metastases.  -Markedly elevated CEA, CA 19-9 and LDH. ERCP done showed moderate biliary stricture in the distal half of CBD with dilated upper third of May in bilateral, left and right hepatic ducts and also intrahepatic branches. Also found choledocholithiasis which was removed followed by biliary spent enterotomy and balloon extraction. One plastic stent was placed into the common bile duct. GI suggested that patient is possibly need EUS with biopsy and possible metal stent placement as outpatient. Will need oncology evaluation once tissue diagnosis obtained. (Patient does not have a PCP.  I will establish care with oncology prior to discharge). -Left is have not improved this morning.  Patient denies any pain or itching. Will monitor for another day to see for his LFTs to trend down. -INR improved after receiving vitamin K on admission.   Nonhealing right ear wound Has an eschar over right ear. No drainage. Wound care consulted. Lesion present for about a year with episodes of bleeding. Patient will need evaluation by dermatologist for biopsy as outpatient.  Severe hypokalemia Replenished with IV potassium and fluids. Magnesium normal.  Malnutrition and deconditioning Nutrition and PT evaluation.  Enlarged prostate Incidentally seen on imaging. Add flomax.  DVT prophylaxis: SCD's  Diet: regular  Code Status:  full code Family Communication: Wife at bedside Disposition Plan: Home tomorrow if LFTs mproving   Consultants:  [facility]  Procedures:  CT abdomen and pelvis  ERCP with biliary stenting    Antibiotics:  None  HPI/Subjective: Seen and examined. No abdominal pain. Itching better.  Objective: Filed Vitals:   05/15/15 2110 05/16/15 0524  BP: 135/56 128/46  Pulse: 85 77  Temp: 98.4 F (36.9 C) 98.8 F (37.1 C)  Resp: 18 18    Intake/Output Summary (Last 24 hours) at 05/16/15 1345 Last data filed at 05/16/15 0700  Gross per 24 hour  Intake 1891.67 ml  Output    400 ml  Net 1491.67 ml   Filed Weights   05/15/15 1552  Weight: 144.607 kg (318 lb 12.8 oz)    Exam:   General:   not in distress  HEENT: icteric, moist mucosa  Cardiovascular: Normal S1 and S2, no murmurs rub or gallop  Respiratory: Clear bilaterally, no added sounds  Abdomen: Soft, mild distention,  nontender, bowel sounds present  Musculoskeletal: Warm, no edema, jaundiced skin     Data Reviewed: Basic Metabolic Panel:  Recent Labs Lab 05/13/15 2201 05/14/15 0902 05/15/15  [id number] 05/15/15 1221 05/16/15 0549  NA 138  --  142 141 139  K 2.7*  --  2.6* 2.9* 3.4*  CL 103  --  106  --  104  CO2 21*  --  23  --  25  GLUCOSE 111*  --  108* 106* 103*   BUN 14  --  8  --  7  CREATININE 0.67  --  0.44*  --  0.52*  CALCIUM 8.4*  --  8.2*  --  8.0*  MG  --  1.9 2.0  --   --    Liver Function Tests:  Recent Labs Lab 05/13/15 2201 05/15/15 0623 05/16/15 0549  AST 152* 154* 173*  ALT 78* 71* 73*  ALKPHOS 535* 458* 458*  BILITOT 14.7* 16.1* 16.6*  PROT 7.6 6.6 6.1*  ALBUMIN 2.5* 2.2* 2.0*    Recent Labs Lab 05/13/15 2201  LIPASE 35    Recent Labs Lab 05/14/15 0138  AMMONIA 48*   CBC:  Recent Labs Lab 05/13/15 2201 05/15/15 0623 05/15/15 1221  WBC 8.3 8.2  --   HGB 12.9* 11.6* 13.3  HCT 39.2 34.7* 39.0  MCV 88.9 90.4  --   PLT 437* 364  --    Cardiac Enzymes: No results for input(s): CKTOTAL, CKMB, CKMBINDEX, TROPONINI in the last 168 hours. BNP (last 3 results) No results for input(s): BNP in the last 8760 hours.  ProBNP (last 3 results) No results for input(s): PROBNP in the last 8760 hours.  CBG:  Recent Labs Lab 05/16/15 0747  GLUCAP 89    No results found for this or any previous visit (from the past 240 hour(s)).   Studies: Dg Ercp Biliary & Pancreatic Ducts  05/15/2015  CLINICAL DATA:  Sphincterotomy EXAM: ERCP TECHNIQUE: Multiple spot images obtained with the fluoroscopic device and submitted for interpretation post-procedure. FLUOROSCOPY TIME:  Radiation Exposure Index (as provided by the fluoroscopic device): If the device does not provide the exposure index: Fluoroscopy Time:  8 minutes and 42 seconds Number of Acquired Images:  8 COMPARISON:  None. FINDINGS: Contrast fills the biliary tree. A stent has been placed across the distal common bile duct into the duodenum. IMPRESSION: See above. These images were submitted for radiologic interpretation only. Please see the procedural report for the amount of contrast and the fluoroscopy time utilized. Electronically Signed   By: Marybelle Killings M.D.   On: 05/15/2015 16:26    Scheduled Meds: . lactulose  20 g Oral TID  . potassium chloride  20 mEq Oral  Daily   Continuous Infusions: . 0.9 % NaCl with KCl 40 mEq / L 100 mL/hr (05/16/15 1333)      Time spent: 25 minutes    [place], [facility] Pager [phone] If 7PM-7AM, please contact night-coverage at [email or web address], password [facility] 05/16/2015, 1:45 PM  LOS: 2 days

## 2015-05-16 NOTE — Progress Notes (Signed)
Occupational Therapy Evaluation Patient Details Name: Keyonn Kubick MRN: [id number] DOB: August 10, 1945 Today's Date: 05/16/2015    History of Present Illness Patient is a 70 y/o male with hx of arthritis who presents with jaundice, abdominal pain. 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 Workup in progress   Clinical Impression   Pt. Was seen for skilled  OT to maximize I with ADLs and mobility. Pt. Is at baseline for ADLs and is S with transfers for commode and shower seat. Pt. Wife can provide this level of S. No further OT required at this time.     Follow Up Recommendations  No OT follow up    Equipment Recommendations  None recommended by OT    Recommendations for Other Services       Precautions / Restrictions Precautions Precautions: Fall Restrictions Weight Bearing Restrictions: No      Mobility Bed Mobility Overal bed mobility: Modified Independent                Transfers     Transfers: Stand Pivot Transfers Sit to Stand: Supervision Stand pivot transfers: Supervision            Balance                                            ADL Overall ADL's : At baseline                                             Vision     Perception     Praxis      Pertinent Vitals/Pain Pain Assessment: 0-10 Pain Score: 1  Pain Location:  (on bottom)     Hand Dominance     Extremity/Trunk Assessment Upper Extremity Assessment Upper Extremity Assessment: Overall WFL for tasks assessed           Communication Communication Communication: No difficulties   Cognition Arousal/Alertness: Awake/alert Behavior During Therapy: WFL for tasks assessed/performed Overall Cognitive Status: Within Functional Limits for tasks assessed                     General Comments       Exercises       Shoulder Instructions      Home Living Family/patient expects to be discharged to:: Private  residence Living Arrangements: Spouse/significant other Available Help at Discharge: Family;Available 24 hours/day Type of Home: House Home Access: Stairs to enter [employer] of Steps: 2 Entrance Stairs-Rails: None Home Layout: One level     Bathroom Shower/Tub: Occupational psychologist: Standard     Home Equipment: Environmental consultant - 2 wheels;Cane - single point;Shower seat;Toilet riser          Prior Functioning/Environment Level of Independence: Needs assistance  Gait / Transfers Assistance Needed:  (Mod I   with AMB) ADL's / Homemaking Assistance Needed:  (Min A with socks and shoes. Does not want AE)        OT Diagnosis:     OT Problem List:     OT Treatment/Interventions:      OT Goals(Current goals can be found in the care plan section) Acute Rehab OT Goals Patient Stated Goal:  (To d/c home.)  OT Frequency:     Barriers  to D/C:            Co-evaluation              End of Session    Activity Tolerance: Patient tolerated treatment well Patient left: in bed;with call bell/phone within reach;with family/visitor present   Time: [id number] OT Time Calculation (min): 39 min Charges:  OT General Charges $OT Visit: 1 Procedure OT Evaluation $OT Eval Low Complexity: 1 Procedure OT Treatments $Self Care/Home Management : 23-37 mins G-Codes:    Brette Cast May 23, 2015, 9:32 AM

## 2015-05-17 DIAGNOSIS — E876 Hypokalemia: Secondary | ICD-10-CM | POA: Diagnosis not present

## 2015-05-17 DIAGNOSIS — K831 Obstruction of bile duct: Secondary | ICD-10-CM | POA: Diagnosis not present

## 2015-05-17 DIAGNOSIS — K838 Other specified diseases of biliary tract: Secondary | ICD-10-CM | POA: Diagnosis not present

## 2015-05-17 DIAGNOSIS — R5381 Other malaise: Secondary | ICD-10-CM | POA: Diagnosis not present

## 2015-05-17 DIAGNOSIS — R740 Nonspecific elevation of levels of transaminase and lactic acid dehydrogenase [LDH]: Secondary | ICD-10-CM

## 2015-05-17 DIAGNOSIS — K8051 Calculus of bile duct without cholangitis or cholecystitis with obstruction: Secondary | ICD-10-CM | POA: Diagnosis present

## 2015-05-17 DIAGNOSIS — K869 Disease of pancreas, unspecified: Secondary | ICD-10-CM | POA: Diagnosis not present

## 2015-05-17 DIAGNOSIS — N400 Enlarged prostate without lower urinary tract symptoms: Secondary | ICD-10-CM | POA: Diagnosis not present

## 2015-05-17 LAB — COMPREHENSIVE METABOLIC PANEL
ALBUMIN: 2 g/dL — AB (ref 3.5–5.0)
ALK PHOS: 444 U/L — AB (ref 38–126)
ALT: 72 U/L — ABNORMAL HIGH (ref 17–63)
ANION GAP: 11 (ref 5–15)
AST: 145 U/L — ABNORMAL HIGH (ref 15–41)
BILIRUBIN TOTAL: 14.5 mg/dL — AB (ref 0.3–1.2)
BUN: 6 mg/dL (ref 6–20)
CALCIUM: 8.1 mg/dL — AB (ref 8.9–10.3)
CO2: 25 mmol/L (ref 22–32)
Chloride: 104 mmol/L (ref 101–111)
Creatinine, Ser: 0.55 mg/dL — ABNORMAL LOW (ref 0.61–1.24)
GLUCOSE: 107 mg/dL — AB (ref 65–99)
Potassium: 3.2 mmol/L — ABNORMAL LOW (ref 3.5–5.1)
Sodium: 140 mmol/L (ref 135–145)
TOTAL PROTEIN: 6.3 g/dL — AB (ref 6.5–8.1)

## 2015-05-17 LAB — PROTIME-INR
INR: 1.1 (ref 0.00–1.49)
PROTHROMBIN TIME: 14.4 s (ref 11.6–15.2)

## 2015-05-17 LAB — GLUCOSE, CAPILLARY: GLUCOSE-CAPILLARY: 109 mg/dL — AB (ref 65–99)

## 2015-05-17 MED ORDER — DIPHENHYDRAMINE HCL 25 MG PO CAPS
25.0000 mg | ORAL_CAPSULE | Freq: Once | ORAL | Status: AC
  Administered 2015-05-17: 25 mg via ORAL
  Filled 2015-05-17: qty 1

## 2015-05-17 MED ORDER — POTASSIUM CHLORIDE CRYS ER 20 MEQ PO TBCR
40.0000 meq | EXTENDED_RELEASE_TABLET | Freq: Once | ORAL | Status: AC
  Administered 2015-05-17: 40 meq via ORAL
  Filled 2015-05-17: qty 2

## 2015-05-17 NOTE — Progress Notes (Signed)
[facility] Progress Note  Chief Complaint: jaundice  Subjective History:  No new complaints.  Denies abd pain, nausea or vomiting  ROS: Cardiovascular:  no chest pain Respiratory: no dyspnea  Objective:  Med list reviewed  Vital signs in last 24 hrs: Filed Vitals:   05/17/15 0453 05/17/15 1406  BP: 137/79 117/58  Pulse: 84 86  Temp: 98.7 F (37.1 C) 98.1 F (36.7 C)  Resp: 18 17    Physical Exam   HEENT: sclera anicteric, oral mucosa moist without lesions  Neck: supple, no thyromegaly, JVD or lymphadenopathy  Cardiac: RRR without murmurs, S1S2 heard, no peripheral edema  Pulm: clear to auscultation bilaterally, normal RR and effort noted  Abdomen: obese, soft, no tenderness, with active bowel sounds. No guarding or palpable hepatosplenomegaly, limited by girth  Skin; warm and dry, no jaundice or rash  Recent Labs:   Recent Labs Lab 05/13/15 2201 05/15/15 0623 05/15/15 1221  WBC 8.3 8.2  --   HGB 12.9* 11.6* 13.3  HCT 39.2 34.7* 39.0  PLT 437* 364  --     Recent Labs Lab 05/17/15 0427  NA 140  K 3.2*  CL 104  CO2 25  BUN 6  ALBUMIN 2.0*  ALKPHOS 444*  ALT 72*  AST 145*  GLUCOSE 107*  bilirubin down from 16.5  to 14.5  today  Recent Labs Lab 05/17/15 0427  INR 1.10   ASMA and AMA still pending  @ASSESSMENTPLANBEGIN @ Assessment:  Jaundice - now considered multifactorial.  Extrahepatic biliary obstruction of unclear cause (?panc mass, possibly cholangioCA), markedly elevated CA 19-9, but also suspected intrahepatic cholestasis of unclear cause. Biliary stricture Choledocholithiasis (removed during ERCP)  Plan: Following LFTs, awaiting other labs, possibly liver Bx Monday pending lab results. He is already planned for an EUS on Thursday, Apr 20th. Discussed with patient, daughter and hospitalist   [name] Pager [phone] Mon-Fri 8a-5p [phone] after 5p, weekends, holidays

## 2015-05-17 NOTE — Progress Notes (Addendum)
[facility] PROGRESS NOTE  Ryan Roach [id number] DOB: May 27, 1945 DOA: 05/13/2015 PCP: No primary care provider on file.  Brief narrative 69 year old obese male with no prior medical history, not seeked regular medical care presented to  the ED with re-4 weeks of dark urine, progressive jaundice, generalized itching, epigastric discomfort, weakness and abdominal distention. He reported passing pale stool, poor appetite and increased sleepiness. Wife noticed that patient was increasingly jaundiced for the past 1 week. Patient doesn't smoke or drink. In the ED workup showed the range LFTs with total bilirubin of 14.7, AST/ALT of 152/78, alkaline phosphatase of 535, INR of 1.82 and ammonia 48. Ultrasound abdomen showed intra-and except hepatic biliary dilatation concerning for distal obstruction with increased hepatic echogenicity. CT abdomen and pelvis showed a 2 cm pancreatic head mass with intra-and except hepatic biliary dilatation. Liver was found to be enlarged without focal lesion. Patient admitted to hospitalist service and [facility] GI consulted.    Assessment/Plan: Obstructive jaundice secondary to pancreatic head mass -CT abdomen and abdominal ultrasound findings as above. CT chest without thoracic metastases.  -Markedly elevated CEA, CA 19-9 and LDH. Pending labs for ASMA and antimitochondrial antibodies. -ERCP done showed moderate biliary stricture in the distal half of CBD with dilated upper third of May in bilateral, left and right hepatic ducts and also intrahepatic branches. Also found choledocholithiasis which was removed followed by biliary spent enterotomy and balloon extraction. One plastic stent was placed into the common bile duct. -Minimal improvement in LFTs. GI having concerns that patient's intrahepatic cholestasis may be reflecting some mother pathology and not clearly a pancreatic malignancy. Discussing about obtaining liver biopsy early next week. -GI to schedule  outpatient EUS with biopsy. -Spoke with Dr. Marin Olp will arrange outpatient follow-up.   Nonhealing right ear wound Has an eschar over right ear. No drainage. Wound care consulted. Lesion present for about a year with episodes of bleeding. Patient will need evaluation by dermatologist for biopsy as outpatient.  Severe hypokalemia Being replenished.  Malnutrition and deconditioning Nutrition and PT evaluation.  Enlarged prostate Incidentally seen on imaging. Reports urinary urgency, hesitancy, and some nocturia. Added flomax.  DVT prophylaxis: SCD's  Diet: regular  Code Status:  full code Family Communication: Wife at bedside Disposition Plan: Pending further workup, possible liver biopsy early next week.   Consultants:  [facility]  Procedures:  CT abdomen and pelvis  ERCP with biliary stenting    Antibiotics:  None  HPI/Subjective: Denies abdominal pain, itching is getting better.  Objective: Filed Vitals:   05/16/15 2221 05/17/15 0453  BP: 133/49 137/79  Pulse: 83 84  Temp: 99.3 F (37.4 C) 98.7 F (37.1 C)  Resp: 18 18    Intake/Output Summary (Last 24 hours) at 05/17/15 1147 Last data filed at 05/17/15 1020  Gross per 24 hour  Intake    720 ml  Output   1300 ml  Net   -580 ml   Filed Weights   05/15/15 1552  Weight: 144.607 kg (318 lb 12.8 oz)    Exam:   General:   not in distress  HEENT: icteric, moist mucosa  Cardiovascular: Normal S1 and S2, no murmurs rub or gallop  Respiratory: Clear bilaterally, no added sounds  Abdomen: Soft, mild distention,  nontender, bowel sounds present  Musculoskeletal: Warm, no edema, jaundiced    Data Reviewed: Basic Metabolic Panel:  Recent Labs Lab 05/13/15 2201 05/14/15 0902 05/15/15 [id number] 05/15/15 1221 05/16/15 0549 05/17/15 0427  NA 138  --  142 141 139  140  K 2.7*  --  2.6* 2.9* 3.4* 3.2*  CL 103  --  106  --  104 104  CO2 21*  --  23  --  25 25  GLUCOSE 111*  --  108* 106* 103*  107*  BUN 14  --  8  --  7 6  CREATININE 0.67  --  0.44*  --  0.52* 0.55*  CALCIUM 8.4*  --  8.2*  --  8.0* 8.1*  MG  --  1.9 2.0  --   --   --    Liver Function Tests:  Recent Labs Lab 05/13/15 2201 05/15/15 0623 05/16/15 0549 05/17/15 0427  AST 152* 154* 173* 145*  ALT 78* 71* 73* 72*  ALKPHOS 535* 458* 458* 444*  BILITOT 14.7* 16.1* 16.6* 14.5*  PROT 7.6 6.6 6.1* 6.3*  ALBUMIN 2.5* 2.2* 2.0* 2.0*    Recent Labs Lab 05/13/15 2201  LIPASE 35    Recent Labs Lab 05/14/15 0138  AMMONIA 48*   CBC:  Recent Labs Lab 05/13/15 2201 05/15/15 0623 05/15/15 1221  WBC 8.3 8.2  --   HGB 12.9* 11.6* 13.3  HCT 39.2 34.7* 39.0  MCV 88.9 90.4  --   PLT 437* 364  --    Cardiac Enzymes: No results for input(s): CKTOTAL, CKMB, CKMBINDEX, TROPONINI in the last 168 hours. BNP (last 3 results) No results for input(s): BNP in the last 8760 hours.  ProBNP (last 3 results) No results for input(s): PROBNP in the last 8760 hours.  CBG:  Recent Labs Lab 05/16/15 0747 05/17/15 0721  GLUCAP 89 109*    No results found for this or any previous visit (from the past 240 hour(s)).   Studies: Dg Ercp Biliary & Pancreatic Ducts  05/15/2015  CLINICAL DATA:  Sphincterotomy EXAM: ERCP TECHNIQUE: Multiple spot images obtained with the fluoroscopic device and submitted for interpretation post-procedure. FLUOROSCOPY TIME:  Radiation Exposure Index (as provided by the fluoroscopic device): If the device does not provide the exposure index: Fluoroscopy Time:  8 minutes and 42 seconds Number of Acquired Images:  8 COMPARISON:  None. FINDINGS: Contrast fills the biliary tree. A stent has been placed across the distal common bile duct into the duodenum. IMPRESSION: See above. These images were submitted for radiologic interpretation only. Please see the procedural report for the amount of contrast and the fluoroscopy time utilized. Electronically Signed   By: Marybelle Killings M.D.   On: 05/15/2015  16:26    Scheduled Meds: . lactulose  20 g Oral BID  . potassium chloride  20 mEq Oral Daily  . tamsulosin  0.4 mg Oral QPC supper   Continuous Infusions:      Time spent: 25 minutes    Louellen Molder  [facility] Pager [phone] If 7PM-7AM, please contact night-coverage at [email or web address], password [facility] 05/17/2015, 11:47 AM  LOS: 3 days

## 2015-05-18 DIAGNOSIS — K8051 Calculus of bile duct without cholangitis or cholecystitis with obstruction: Secondary | ICD-10-CM | POA: Diagnosis not present

## 2015-05-18 DIAGNOSIS — K869 Disease of pancreas, unspecified: Secondary | ICD-10-CM | POA: Diagnosis not present

## 2015-05-18 DIAGNOSIS — R740 Nonspecific elevation of levels of transaminase and lactic acid dehydrogenase [LDH]: Secondary | ICD-10-CM | POA: Diagnosis not present

## 2015-05-18 DIAGNOSIS — K831 Obstruction of bile duct: Secondary | ICD-10-CM | POA: Diagnosis not present

## 2015-05-18 DIAGNOSIS — N400 Enlarged prostate without lower urinary tract symptoms: Secondary | ICD-10-CM | POA: Diagnosis not present

## 2015-05-18 DIAGNOSIS — K838 Other specified diseases of biliary tract: Secondary | ICD-10-CM | POA: Diagnosis not present

## 2015-05-18 DIAGNOSIS — E876 Hypokalemia: Secondary | ICD-10-CM | POA: Diagnosis not present

## 2015-05-18 DIAGNOSIS — R5381 Other malaise: Secondary | ICD-10-CM | POA: Diagnosis not present

## 2015-05-18 LAB — ANTI-SMOOTH MUSCLE ANTIBODY, IGG: F-Actin IgG: 12 Units (ref 0–19)

## 2015-05-18 LAB — COMPREHENSIVE METABOLIC PANEL
ALBUMIN: 2.2 g/dL — AB (ref 3.5–5.0)
ALK PHOS: 445 U/L — AB (ref 38–126)
ALT: 72 U/L — ABNORMAL HIGH (ref 17–63)
AST: 136 U/L — AB (ref 15–41)
Anion gap: 11 (ref 5–15)
BILIRUBIN TOTAL: 13.8 mg/dL — AB (ref 0.3–1.2)
BUN: 8 mg/dL (ref 6–20)
CO2: 25 mmol/L (ref 22–32)
Calcium: 8.3 mg/dL — ABNORMAL LOW (ref 8.9–10.3)
Chloride: 102 mmol/L (ref 101–111)
Creatinine, Ser: 0.55 mg/dL — ABNORMAL LOW (ref 0.61–1.24)
GFR calc Af Amer: >60 mL/min (ref >60)
GLUCOSE: 103 mg/dL — AB (ref 65–99)
POTASSIUM: 3.5 mmol/L (ref 3.5–5.1)
Sodium: 138 mmol/L (ref 135–145)
Total Protein: 6.9 g/dL (ref 6.5–8.1)

## 2015-05-18 LAB — MITOCHONDRIAL ANTIBODIES: MITOCHONDRIAL M2 AB, IGG: 5.2 U (ref 0.0–20.0)

## 2015-05-18 LAB — GLUCOSE, CAPILLARY: Glucose-Capillary: 96 mg/dL (ref 65–99)

## 2015-05-18 MED ORDER — DIPHENHYDRAMINE HCL 25 MG PO CAPS
25.0000 mg | ORAL_CAPSULE | Freq: Four times a day (QID) | ORAL | Status: DC | PRN
  Administered 2015-05-18 (×3): 25 mg via ORAL
  Filled 2015-05-18 (×3): qty 1

## 2015-05-18 MED ORDER — CHOLESTYRAMINE LIGHT 4 G PO PACK
4.0000 g | PACK | Freq: Two times a day (BID) | ORAL | Status: DC
  Administered 2015-05-18 – 2015-05-19 (×2): 4 g via ORAL
  Filled 2015-05-18 (×5): qty 1

## 2015-05-18 NOTE — Progress Notes (Signed)
[facility] PROGRESS NOTE  Ryan Roach [id number] DOB: August 03, 1945 DOA: 05/13/2015 PCP: No primary care provider on file.  Brief narrative 70 year old obese male with no prior medical history, not seeked regular medical care presented to  the ED with re-4 weeks of dark urine, progressive jaundice, generalized itching, epigastric discomfort, weakness and abdominal distention. He reported passing pale stool, poor appetite and increased sleepiness. Wife noticed that patient was increasingly jaundiced for the past 1 week. Patient doesn't smoke or drink. In the ED workup showed the range LFTs with total bilirubin of 14.7, AST/ALT of 152/78, alkaline phosphatase of 535, INR of 1.82 and ammonia 48. Ultrasound abdomen showed intra-and except hepatic biliary dilatation concerning for distal obstruction with increased hepatic echogenicity. CT abdomen and pelvis showed a 2 cm pancreatic head mass with intra-and except hepatic biliary dilatation. Liver was found to be enlarged without focal lesion. Patient admitted to hospitalist service and [facility] consulted.    Assessment/Plan: Obstructive jaundice secondary to pancreatic head mass -CT abdomen and abdominal ultrasound findings as above. CT chest without thoracic metastases.  -Markedly elevated CEA, CA 19-9 and LDH. Pending labs for ASMA and antimitochondrial antibodies. -ERCP done showed moderate biliary stricture in the distal half of CBD with dilated upper third of May in bilateral, left and right hepatic ducts and also intrahepatic branches. Also found choledocholithiasis which was removed followed by biliary spent enterotomy and balloon extraction. One plastic stent was placed into the common bile duct. -Minimal improvement in LFTs  past 48 hours following stent placement. GI having concerns that patient's intrahepatic cholestasis may be reflecting some mother pathology and not clearly a pancreatic malignancy. Will possibly need liver biopsy  this week. consult IR. -EUS with biopsy scheduled for 05/29/2015 at [place] with Dr. Marin Olp will arrange outpatient follow-up.   Pruritus Secondary to obstructive jaundice. Added Stranahan and when necessary Benadryl.   Nonhealing right ear wound Has an eschar over right ear. No drainage. Wound care consulted. Lesion present for about a year with episodes of bleeding. Patient will need evaluation by dermatologist for biopsy as outpatient.  Severe hypokalemia Replenished.     Enlarged prostate Incidentally seen on imaging. Reports urinary urgency, hesitancy, and some nocturia. Added flomax.  DVT prophylaxis: SCD's  Diet: regular  Code Status:  full code Family Communication: Wife at bedside Disposition Plan: Pending further workup, possible liver biopsy this week.   Consultants:  [facility]  Procedures:  CT abdomen and pelvis  ERCP with biliary stenting    Antibiotics:  None  HPI/Subjective: Patient reports severe itching mainly in the back.  Objective: Filed Vitals:   05/17/15 2157 05/18/15 0511  BP: 134/64 122/56  Pulse: 84 77  Temp: 99.3 F (37.4 C) 97.9 F (36.6 C)  Resp: 19 19    Intake/Output Summary (Last 24 hours) at 05/18/15 1126 Last data filed at 05/18/15 [id number]  Gross per 24 hour  Intake    840 ml  Output      0 ml  Net    840 ml   Filed Weights   05/15/15 1552  Weight: 144.607 kg (318 lb 12.8 oz)    Exam:   General:   not in distress  HEENT: icteric, moist mucosa  Cardiovascular: Normal S1 and S2, no murmurs rub or gallop  Respiratory: Clear bilaterally, no added sounds  Abdomen: Soft, mild distention,  nontender, bowel sounds present  Musculoskeletal: Warm, no edema, jaundiced, pruritic rash on the back    Data Reviewed: Basic Metabolic  Panel:  Recent Labs Lab 05/13/15 2201 05/14/15 0902 05/15/15 [id number] 05/15/15 1221 05/16/15 0549 05/17/15 0427 05/18/15 0657  NA 138  --  142 141 139 140 138  K  2.7*  --  2.6* 2.9* 3.4* 3.2* 3.5  CL 103  --  106  --  104 104 102  CO2 21*  --  23  --  25 25 25   GLUCOSE 111*  --  108* 106* 103* 107* 103*  BUN 14  --  8  --  7 6 8   CREATININE 0.67  --  0.44*  --  0.52* 0.55* 0.55*  CALCIUM 8.4*  --  8.2*  --  8.0* 8.1* 8.3*  MG  --  1.9 2.0  --   --   --   --    Liver Function Tests:  Recent Labs Lab 05/13/15 2201 05/15/15 0623 05/16/15 0549 05/17/15 0427 05/18/15 0657  AST 152* 154* 173* 145* 136*  ALT 78* 71* 73* 72* 72*  ALKPHOS 535* 458* 458* 444* 445*  BILITOT 14.7* 16.1* 16.6* 14.5* 13.8*  PROT 7.6 6.6 6.1* 6.3* 6.9  ALBUMIN 2.5* 2.2* 2.0* 2.0* 2.2*    Recent Labs Lab 05/13/15 2201  LIPASE 35    Recent Labs Lab 05/14/15 0138  AMMONIA 48*   CBC:  Recent Labs Lab 05/13/15 2201 05/15/15 0623 05/15/15 1221  WBC 8.3 8.2  --   HGB 12.9* 11.6* 13.3  HCT 39.2 34.7* 39.0  MCV 88.9 90.4  --   PLT 437* 364  --    Cardiac Enzymes: No results for input(s): CKTOTAL, CKMB, CKMBINDEX, TROPONINI in the last 168 hours. BNP (last 3 results) No results for input(s): BNP in the last 8760 hours.  ProBNP (last 3 results) No results for input(s): PROBNP in the last 8760 hours.  CBG:  Recent Labs Lab 05/16/15 0747 05/17/15 0721 05/18/15 0804  GLUCAP 89 109* 96    No results found for this or any previous visit (from the past 240 hour(s)).   Studies: No results found.  Scheduled Meds: . cholestyramine  4 g Oral BID  . lactulose  20 g Oral BID  . potassium chloride  20 mEq Oral Daily  . tamsulosin  0.4 mg Oral QPC supper   Continuous Infusions:      Time spent: 25 minutes    Louellen Molder  [facility] Pager [phone] If 7PM-7AM, please contact night-coverage at [email or web address], password [facility] 05/18/2015, 11:26 AM  LOS: 4 days

## 2015-05-18 NOTE — Progress Notes (Signed)
Patient ID: Ryan Roach, male   DOB: May 02, 1945, 70 y.o.   MRN: [id number]    Progress Note   Subjective   Doing well- says abdomen feels a lot batter than when he came here- eating without difficulty  Rash on back and chest which has been present over the past couple weeks uncomfortable and itching Asking questions about pancreas and liver  CA19-9 = 1853 Hepatic autoimmune markers still pending Cytology still pending   Objective   Vital signs in last 24 hours: Temp:  [97.9 F (36.6 C)-99.3 F (37.4 C)] 97.9 F (36.6 C) (04/09 0511) Pulse Rate:  [77-86] 77 (04/09 0511) Resp:  [17-19] 19 (04/09 0511) BP: (117-134)/(56-64) 122/56 mmHg (04/09 0511) SpO2:  [97 %-99 %] 99 % (04/09 0511) Last BM Date: 05/17/15 General:   Obese white male in NAD jaundiced Heart:  Regular rate and rhythm; no murmurs Lungs: Respirations even and unlabored, lungs CTA bilaterally Abdomen:  Soft, minimally tender RUQ,and nondistended. Normal bowel sounds. Extremities:  Without edema.- diffuse papular rash on back  With small pustules Neurologic:  Alert and oriented,  grossly normal neurologically. Psych:  Cooperative. Normal mood and affect.  Intake/Output from previous day: 04/08 0701 - 04/09 0700 In: 840 [P.O.:840] Out: 550 [Urine:550] Intake/Output this shift: Total I/O In: 240 [P.O.:240] Out: -   Lab Results:  Recent Labs  05/15/15 1221  HGB 13.3  HCT 39.0   BMET  Recent Labs  05/16/15 0549 05/17/15 0427 05/18/15 0657  NA 139 140 138  K 3.4* 3.2* 3.5  CL 104 104 102  CO2 25 25 25   GLUCOSE 103* 107* 103*  BUN 7 6 8   CREATININE 0.52* 0.55* 0.55*  CALCIUM 8.0* 8.1* 8.3*   LFT  Recent Labs  05/18/15 0657  PROT 6.9  ALBUMIN 2.2*  AST 136*  ALT 72*  ALKPHOS 445*  BILITOT 13.8*   PT/INR  Recent Labs  05/17/15 0427  LABPROT 14.4  INR 1.10      Assessment / Plan:    41 70 yo male with choledocholithiasis- stable s/p stone extraction, stent placement #2  persistent  LFT elevation- gradual improvement- concern for underlying liver disease- markers pending- may need liver bx tomorrow or Tuesday #3 pancreatic head mass and marked elevation of CA19-9- likely malignant- he is scheduled for EUS and Bx with Dr Ardis Hughs on 4/20 at [facility] 10 :30 am #4 Rash/dermatitis  Active Problems:   Pancreatic cancer ([place])   Obstructive jaundice due to cancer   Hypokalemia   Enlarged prostate   Non-healing wound or right  ear   Malnutrition ([place])   Physical deconditioning   Abnormal EKG   Hyperbilirubinemia   Jaundice   Biliary stricture   Calculus of bile duct with obstruction and without cholangitis or cholecystitis     LOS: 4 days   Amy Esterwood  05/18/2015, 9:25 AM  I have discussed the case with the PA, and that is the plan I formulated.I also examined the patient and reviewed all labs.  AMA has come back negative.  ASMA pending Brushings still pending (prob done tomorrow or Tues) CC: jaundice  I discussed the case with patient and his extended family this afternoon. While his LFTs are minimally improved,  I feel it would be best to proceed with the liver biopsy tomorrow if IR is available. He is agreeable.  Dr Havery Moros will assume consultative care tomorrow.

## 2015-05-19 ENCOUNTER — Encounter (HOSPITAL_COMMUNITY): Payer: Self-pay | Admitting: Gastroenterology

## 2015-05-19 ENCOUNTER — Other Ambulatory Visit: Payer: Self-pay | Admitting: Physician Assistant

## 2015-05-19 DIAGNOSIS — R7401 Elevation of levels of liver transaminase levels: Secondary | ICD-10-CM | POA: Diagnosis present

## 2015-05-19 DIAGNOSIS — K838 Other specified diseases of biliary tract: Secondary | ICD-10-CM | POA: Diagnosis not present

## 2015-05-19 DIAGNOSIS — E876 Hypokalemia: Secondary | ICD-10-CM | POA: Diagnosis not present

## 2015-05-19 DIAGNOSIS — K831 Obstruction of bile duct: Secondary | ICD-10-CM | POA: Diagnosis not present

## 2015-05-19 DIAGNOSIS — R17 Unspecified jaundice: Secondary | ICD-10-CM

## 2015-05-19 DIAGNOSIS — K8051 Calculus of bile duct without cholangitis or cholecystitis with obstruction: Secondary | ICD-10-CM | POA: Diagnosis not present

## 2015-05-19 DIAGNOSIS — R5381 Other malaise: Secondary | ICD-10-CM | POA: Diagnosis not present

## 2015-05-19 DIAGNOSIS — N400 Enlarged prostate without lower urinary tract symptoms: Secondary | ICD-10-CM | POA: Diagnosis present

## 2015-05-19 DIAGNOSIS — R740 Nonspecific elevation of levels of transaminase and lactic acid dehydrogenase [LDH]: Secondary | ICD-10-CM

## 2015-05-19 DIAGNOSIS — K869 Disease of pancreas, unspecified: Secondary | ICD-10-CM | POA: Diagnosis not present

## 2015-05-19 DIAGNOSIS — K8689 Other specified diseases of pancreas: Secondary | ICD-10-CM

## 2015-05-19 LAB — COMPREHENSIVE METABOLIC PANEL
ALK PHOS: 395 U/L — AB (ref 38–126)
ALT: 68 U/L — ABNORMAL HIGH (ref 17–63)
ANION GAP: 12 (ref 5–15)
AST: 132 U/L — ABNORMAL HIGH (ref 15–41)
Albumin: 2.1 g/dL — ABNORMAL LOW (ref 3.5–5.0)
BILIRUBIN TOTAL: 11.3 mg/dL — AB (ref 0.3–1.2)
BUN: 11 mg/dL (ref 6–20)
CALCIUM: 8.2 mg/dL — AB (ref 8.9–10.3)
CO2: 23 mmol/L (ref 22–32)
Chloride: 103 mmol/L (ref 101–111)
Creatinine, Ser: 0.69 mg/dL (ref 0.61–1.24)
GFR calc non Af Amer: >60 mL/min (ref >60)
Glucose, Bld: 108 mg/dL — ABNORMAL HIGH (ref 65–99)
Potassium: 3.6 mmol/L (ref 3.5–5.1)
Sodium: 138 mmol/L (ref 135–145)
TOTAL PROTEIN: 6.6 g/dL (ref 6.5–8.1)

## 2015-05-19 LAB — GLUCOSE, CAPILLARY: Glucose-Capillary: 92 mg/dL (ref 65–99)

## 2015-05-19 LAB — MONONUCLEOSIS SCREEN: Mono Screen: NEGATIVE

## 2015-05-19 MED ORDER — TAMSULOSIN HCL 0.4 MG PO CAPS: 0.4000 mg | ORAL_CAPSULE | Freq: Every day | ORAL | Status: DC

## 2015-05-19 MED ORDER — CHOLESTYRAMINE LIGHT 4 G PO PACK: 4.0000 g | PACK | Freq: Two times a day (BID) | ORAL | Status: DC

## 2015-05-19 MED ORDER — DIPHENHYDRAMINE HCL 25 MG PO CAPS: 25.0000 mg | ORAL_CAPSULE | Freq: Four times a day (QID) | ORAL | Status: DC | PRN

## 2015-05-19 NOTE — Progress Notes (Signed)
          Daily Rounding Note  05/19/2015, 8:37 AM  LOS: 5 days   SUBJECTIVE:       Still pruritus.  No abdominal pain, no n/v.  Still with anorexia. Still dark urine Overall feels better and wants to go home.   OBJECTIVE:         Vital signs in last 24 hours:    Temp:  [97.7 F (36.5 C)-98.3 F (36.8 C)] 98.3 F (36.8 C) (04/10 0609) Pulse Rate:  [75-79] 78 (04/10 0609) Resp:  [16-18] 18 (04/10 0609) BP: (122-132)/(55-60) 131/57 mmHg (04/10 0609) SpO2:  [99 %] 99 % (04/10 0609) Last BM Date: 05/18/15 Filed Weights   05/15/15 1552  Weight: 144.607 kg (318 lb 12.8 oz)   General: pleasant, less jaudicec   Heart: RRR Chest: clear bil Abdomen: obese, NT, soft  Extremities: no CCE Neuro/Psych:  Pleasant, oriented x 3.  No gross wekness or deficits.  Derm:  Rash on upper back.   Intake/Output from previous day: 04/09 0701 - 04/10 0700 In: 560 [P.O.:560] Out: 125 [Urine:125]  Intake/Output this shift:    Lab Results: No results for input(s): WBC, HGB, HCT, PLT in the last 72 hours. BMET  Recent Labs  05/17/15 0427 05/18/15 0657 05/19/15 0608  NA 140 138 138  K 3.2* 3.5 3.6  CL 104 102 103  CO2 25 25 23   GLUCOSE 107* 103* 108*  BUN 6 8 11   CREATININE 0.55* 0.55* 0.69  CALCIUM 8.1* 8.3* 8.2*   LFT  Recent Labs  05/17/15 0427 05/18/15 0657 05/19/15 0608  PROT 6.3* 6.9 6.6  ALBUMIN 2.0* 2.2* 2.1*  AST 145* 136* 132*  ALT 72* 72* 68*  ALKPHOS 444* 445* 395*  BILITOT 14.5* 13.8* 11.3*   PT/INR  Recent Labs  05/17/15 0427  LABPROT 14.4  INR 1.10   Hepatitis Panel No results for input(s): HEPBSAG, HCVAB, HEPAIGM, HEPBIGM in the last 72 hours.  Studies/Results: No results found.  ASSESMENT:   *  Pancreatic head mass, marked elevated CA19-9 c/w pancreatic cancer.  05/15/15 ERCP/biliary, sphincterotomy and stent placed to CBD stricture.  Duct brushing cytology pending.  LFTs slowly improved,  irregular liver per imaging: rule out cirrhosis.  Liver biopsy planned  *  Coagulopathy, resolved.     PLAN:     *  EUS with FNA set for 4/20, [place] at 10:30.   *  Orders in place for u/s liver biopsy. Pt NPO.   However Dr Annamaria Boots reluctant to perform, due to risk of leak.  Dr Havery Moros will d/w him.         Azucena Freed  05/19/2015, 8:37 AM Pager: [phone]

## 2015-05-19 NOTE — Progress Notes (Signed)
Patient ID: Ryan Roach, male   DOB: 06-Jul-1945, 70 y.o.   MRN: [id number]   Request for liver biopsy   Pt with no liver lesion Panc lesion too small  Random core bx NOT recommended in this pt with known biliary obstruction/dilation ---per risk of leak   Discussed with Trula Slade [facility] She will pass on to Dr Havery Moros  Pt is scheduled for EUS bx 4/20 [facility]

## 2015-05-19 NOTE — Care Management Important Message (Signed)
Important Message  Patient Details  Name: Ryan Roach MRN: [id number] Date of Birth: 20-Mar-1945   Medicare Important Message Given:  Yes    Nathen May 05/19/2015, 1:03 PM

## 2015-05-19 NOTE — Progress Notes (Signed)
Discharge instructions RX's and follow up appts explained and provided to patient and family verbalized understanding, patient d/c home with family left floor via wheelchair accompanied by staff no c/o pain or shortness of breath at d/c.  Garrett Mitchum, Tivis Ringer, RN

## 2015-05-19 NOTE — Progress Notes (Signed)
Physical Therapy Treatment Patient Details Name: Ryan Roach MRN: [id number] DOB: [birth year] Today's Date: 05/19/2015    History of Present Illness Patient is a 70 y/o male with hx of arthritis who presents with jaundice, abdominal pain. 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 Workup in progress    PT Comments    Pt upset about his biopsy schedule being pushed back, but willing to work with therapy. Pt is able to move in bed and perform basic transfers without cues, and needs minimal cues during ambulation. Pt still has deficits in endurance, limiting his functional mobility. Unable to practice stairs today because pt requested to return to room during ambulation. Will continue to follow.   Follow Up Recommendations  Supervision - Intermittent;Home health PT     Equipment Recommendations       Recommendations for Other Services       Precautions / Restrictions Precautions Precautions: Fall Restrictions Weight Bearing Restrictions: No    Mobility  Bed Mobility Overal bed mobility: Modified Independent Bed Mobility: Supine to Sit     Supine to sit: HOB elevated;Modified independent (Device/Increase time)     General bed mobility comments: pt reported having HOB elevated at home at height of 2-3 pillows  Transfers Overall transfer level: Needs assistance Equipment used: Rolling walker (2 wheeled) Transfers: Sit to/from Stand Sit to Stand: Supervision         General transfer comment: supervision for safety  Ambulation/Gait Ambulation/Gait assistance: Supervision Ambulation Distance (Feet): 150 Feet Assistive device: Rolling walker (2 wheeled) Gait Pattern/deviations: Step-through pattern;Trunk flexed;Decreased stride length   Gait velocity interpretation: Below normal speed for age/gender General Gait Details: cues for posture and position in RW   Stairs            Wheelchair Mobility    Modified Rankin (Stroke Patients Only)       Balance                                    Cognition Arousal/Alertness: Awake/alert Behavior During Therapy: WFL for tasks assessed/performed Overall Cognitive Status: Within Functional Limits for tasks assessed                      Exercises General Exercises - Lower Extremity Ankle Circles/Pumps: AROM;Both;15 reps;Seated Long Arc Quad: AROM;Both;10 reps;Seated Hip ABduction/ADduction: AROM;Both;15 reps;Seated Hip Flexion/Marching: AROM;Both;10 reps;Seated    General Comments        Pertinent Vitals/Pain Pain Assessment: No/denies pain    Home Living                      Prior Function            PT Goals (current goals can now be found in the care plan section) Progress towards PT goals: Progressing toward goals    Frequency       PT Plan Current plan remains appropriate    Co-evaluation             End of Session Equipment Utilized During Treatment: Gait belt Activity Tolerance: No increased pain;Patient limited by fatigue;Patient tolerated treatment well Patient left: in chair;with call bell/phone within reach     Time: 1012-1034 PT Time Calculation (min) (ACUTE ONLY): 22 min  Charges:  $Gait Training: 8-22 mins                    G Codes:  Haze Justin 05/19/2015, 10:55 AM  Haze Justin, SPT [phone]

## 2015-05-19 NOTE — Discharge Summary (Signed)
Physician Discharge Summary  Ryan Roach [id number] DOB: 07-May-1945 DOA: 05/13/2015  PCP: No primary care provider on file.  Admit date: 05/13/2015 Discharge date: 05/19/2015  Time spent: 35 minutes  Recommendations for Outpatient Follow-up:  1. Discharge home with outpt follow up with [facility] for LFT monitoring. EUS with biopsy on 05/29/2015 at 10:30 am 2. Patient needs to see a dermatologist for his right ear lesion .   Discharge Diagnoses:  Principal Problem:   Obstructive jaundice   Active Problems:   Pancreatic mass   Morbid obesity (HCC)   Hypokalemia   Enlarged prostate   Non-healing wound or right  ear   Physical deconditioning   Hyperbilirubinemia   Jaundice   Biliary stricture   Calculus of bile duct with obstruction and without cholangitis or cholecystitis   BPH (benign prostatic hyperplasia)   Transaminitis   Discharge Condition: fair  Diet recommendation: regular  Code status: full code  Filed Weights   05/15/15 1552  Weight: 144.607 kg (318 lb 12.8 oz)    History of present illness:  Please refer to admission H&P for details, in brief,  70 year old obese male with no prior medical history, not seeked regular medical care presented to the ED with re-4 weeks of dark urine, progressive jaundice, generalized itching, epigastric discomfort, weakness and abdominal distention. He reported passing pale stool, poor appetite and increased sleepiness. Wife noticed that patient was increasingly jaundiced for the past 1 week. Patient doesn't smoke or drink. In the ED workup showed the range LFTs with total bilirubin of 14.7, AST/ALT of 152/78, alkaline phosphatase of 535, INR of 1.82 and ammonia 48. Ultrasound abdomen showed intra-and except hepatic biliary dilatation concerning for distal obstruction with increased hepatic echogenicity. CT abdomen and pelvis showed a 2 cm pancreatic head mass with intra-and except hepatic biliary dilatation. Liver was found to be  enlarged without focal lesion. Patient admitted to hospitalist service and [facility] consulted.  Hospital Course:  Obstructive jaundice secondary to pancreatic head mass -CT abdomen and abdominal ultrasound findings as above. CT chest without thoracic metastases.  -Markedly elevated CEA, CA 19-9 and LDH. normal ASMA and antimitochondrial antibodies. -ERCP done showed moderate biliary stricture in the distal half of CBD with dilated upper third of May in bilateral, left and right hepatic ducts and also intrahepatic branches. Also found choledocholithiasis which was removed followed by biliary sphincterotomy and balloon extraction. One plastic stent was placed into the common bile duct.  -LFTs slowly improving following stent placement. GI having concerns that patient's intrahepatic cholestasis may be reflecting some other pathology and not clearly a pancreatic malignancy.  Discussed with IR on liver biopsy but had concerns on increase bile leak with liver biopsy due to underlying biliary obstruction.   -GI recommend discharging patient home and have repeat LFTs checked in 2 days. They will also follow result fo brushing from ERCP at that time. IgG4 and monospot test sent.   -EUS with biopsy scheduled for 05/29/2015 at [facility] with Dr. Marin Olp who  will arrange outpatient follow-up.   Pruritus Secondary to obstructive jaundice. Added questran and when necessary Benadryl.   Nonhealing right ear wound Has an eschar over right ear. No drainage. Wound care consulted. Lesion present for about a year with episodes of bleeding. Patient will need evaluation by dermatologist for biopsy as outpatient.  Severe hypokalemia Replenished.   Enlarged prostate Incidentally seen on imaging. Reports urinary urgency, hesitancy, and some nocturia. Added flomax.   Family Communication: Wife at bedside  Disposition Plan: home with outpt follow up   Consultants:  [facility]  Procedures:  CT abdomen and pelvis  ERCP with biliary stenting   Antibiotics:  None  Discharge Exam: Filed Vitals:   05/19/15 0609 05/19/15 1330  BP: 131/57 142/61  Pulse: 78 82  Temp: 98.3 F (36.8 C) 98.3 F (36.8 C)  Resp: 18 18    General:elderly obese male not in distress  HEENT: icteric, moist mucosa  Cardiovascular: Normal S1 and S2, no murmurs rub or gallop  Respiratory: Clear bilaterally, no added sounds  Abdomen: Soft, non distended, nontender, bowel sounds present  Musculoskeletal: Warm, no edema, jaundiced, pruritic rash on the back  CNS: alert and oriented  Discharge Instructions    Current Discharge Medication List    START taking these medications   Details  cholestyramine light (PREVALITE) 4 g packet Take 1 packet (4 g total) by mouth 2 (two) times daily. Qty: 20 packet, Refills: 0    diphenhydrAMINE (BENADRYL) 25 mg capsule Take 1 capsule (25 mg total) by mouth every 6 (six) hours as needed for itching. Qty: 30 capsule, Refills: 0    tamsulosin (FLOMAX) 0.4 MG CAPS capsule Take 1 capsule (0.4 mg total) by mouth daily after supper. Qty: 30 capsule, Refills: 0      CONTINUE these medications which have NOT CHANGED   Details  acetaminophen (TYLENOL) 500 MG tablet Take 500 mg by mouth 3 (three) times daily. Take every day per patient    aspirin 325 MG tablet Take 325 mg by mouth daily.      STOP taking these medications     ferrous sulfate 325 (65 FE) MG tablet      methocarbamol (ROBAXIN) 500 MG tablet      oxyCODONE-acetaminophen (PERCOCET/ROXICET) 5-325 MG per tablet      polyethylene glycol (MIRALAX / GLYCOLAX) packet      rivaroxaban (XARELTO) 10 MG TABS tablet        No Known Allergies Follow-up Information    Follow up with [facility] On 05/29/2015.   Why:  arrive 1030 go to outpt registration for noon EUS procedure, Dr Ardis Hughs. no breakfast.  can take meds with water before 9AM or as otherwise  instructed by endo staff.  endoscopy phone # is [phone]       Follow up with [facility] On 05/21/2015.   Why:  go to lab any time 8 to 5 PM.  [address] of [place] building.    Contact information:   [place] [phone]       Follow up with Volanda Napoleon, MD.   Specialty:  Oncology   Why:  office will call for appt.    Contact information:   [address], [address] [place] [address] [phone]        The results of significant diagnostics from this hospitalization (including imaging, microbiology, ancillary and laboratory) are listed below for reference.    Significant Diagnostic Studies: Ct Chest W Contrast  05/14/2015  CLINICAL DATA:  Cancer work up patient has a pancreatic mass on [place] and CT abdomen today. Patient denies any shortness of breath, or chest pain. No history of smoking, does chew tobacco EXAM: CT CHEST WITH CONTRAST TECHNIQUE: Multidetector CT imaging of the chest was performed during intravenous contrast administration. CONTRAST:  1 ISOVUE-300 IOPAMIDOL (ISOVUE-300) INJECTION 61% COMPARISON:  CT abdomen from earlier the same day FINDINGS: Mediastinum/Lymph Nodes: No masses, pathologically  enlarged lymph nodes, or other significant abnormality. Scattered aortic and coronary calcifications. No pericardial effusion. Lungs/Pleura: No nodule, infiltrate, or consolidation. No pleural effusion or pneumothorax. Upper abdomen: Intra and extrahepatic biliary ductal dilatation. Musculoskeletal: Bridging osteophytes across multiple contiguous levels in the mid and lower thoracic spine. Sternum intact. Negative for fracture. IMPRESSION: 1. No evidence of thoracic metastatic disease. 2. Atherosclerosis, including aortic and coronary artery disease. Please note that although the presence of coronary artery calcium documents the presence of coronary artery disease, the severity of this disease and any potential stenosis cannot  be assessed on this non-gated CT examination. Assessment for potential risk factor modification, dietary therapy or pharmacologic therapy may be warranted, if clinically indicated. Electronically Signed   By: Lucrezia Europe M.D.   On: 05/14/2015 10:40   US Abdomen Complete  05/14/2015  CLINICAL DATA:  Jaundice for 3 days, question obstruction. Abdominal discomfort. EXAM: ABDOMEN ULTRASOUND COMPLETE COMPARISON:  None. FINDINGS: Gallbladder: Mildly distended with a small gallstone. No wall thickening visualized. No sonographic Murphy sign noted by sonographer. Common bile duct: Diameter: 11 mm at the porta hepatis. Not visualized distally. Liver: Diffuse increased and heterogeneous in echogenicity. Liver parenchyma is difficult to penetrate. No gross focal lesion. Normal directional flow in the main portal vein. There is central intrahepatic biliary ductal dilatation. IVC: No abnormality visualized. Pancreas: Not well visualized due to body habitus and bowel gas. Spleen: Size and appearance within normal limits were visualized. Right Kidney: Length: 13.4 cm. Echogenicity within normal limits. No mass or hydronephrosis visualized. Left Kidney: Length: 12.6 cm. Echogenicity within normal limits. No mass or hydronephrosis visualized. Abdominal aorta: Majority obscured due to bowel gas and habitus. No aneurysm visualized proximally, mid and distal obscured. Other findings: None. Technically challenging exam due to body habitus and bowel gas. IMPRESSION: 1. Intra and extrahepatic biliary ductal dilatation. Mild gallbladder distention. Findings concerning for distal obstruction, etiology not visualized sonographically. Further evaluation is warranted with CT or MRCP. 2. Increased hepatic echogenicity, likely secondary to steatosis, liver parenchyma difficult to penetrate. 3. Limited assessment of midline structures. Electronically Signed   By: Jeb Levering M.D.   On: 05/14/2015 01:45   Ct Abdomen Pelvis W  Contrast  05/14/2015  CLINICAL DATA:  Right upper quadrant abdominal tenderness in jaundice. Biliary dilatation on ultrasound. EXAM: CT ABDOMEN AND PELVIS WITH CONTRAST TECHNIQUE: Multidetector CT imaging of the abdomen and pelvis was performed using the standard protocol following bolus administration of intravenous contrast. CONTRAST:  145mL ISOVUE-300 IOPAMIDOL (ISOVUE-300) INJECTION 61% COMPARISON:  Abdominal ultrasound earlier this day. FINDINGS: Lower chest: Heart at the upper limits of normal in size. Coronary artery calcifications. No pleural effusion. No consolidation. Liver: Enlarged of decreased density. No focal lesion. Mild motion artifact partially obscures inferior liver. Hepatobiliary: Intra and extrahepatic biliary ductal dilatation. Gallbladder is distended. Mild gallbladder wall thickening. Common bile duct measures up to 1.7 cm in the midportion. No calcified choledocholithiasis. Mild motion artifact. Pancreas: Questionable hypodense lesion in the head measuring 2 cm. No pancreatic ductal dilatation. No peripancreatic inflammation. Mild motion artifact. Spleen: Normal. Adrenal glands: No nodule. Kidneys: Symmetric renal enhancement and excretion. No hydronephrosis. Stomach/Bowel: Stomach physiologically distended. There are no dilated or thickened small bowel loops. Moderate volume of stool throughout the colon without colonic wall thickening. There is colonic tortuosity. The appendix is normal. Vascular/Lymphatic: No retroperitoneal adenopathy. Abdominal aorta is normal in caliber. Moderate atherosclerosis without aneurysm. Reproductive: Enlarged prostate gland measuring 6.8 cm transverse. Small bilateral external iliac nodes, all subcentimeter in size.  Bladder: Physiologically distended, no wall thickening. Other: No free air, free fluid, or intra-abdominal fluid collection. Fat containing umbilical hernia. Musculoskeletal: There are no acute or suspicious osseous abnormalities. Multilevel  degenerative change throughout the spine. Degenerative change of both hips. IMPRESSION: 1. 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 This is incompletely characterized. MRCP recommended if patient is able to tolerate breath hold technique. 2. Incidental finding of prostatomegaly. Atherosclerosis including coronary artery calcifications. Electronically Signed   By: Jeb Levering M.D.   On: 05/14/2015 05:59   Dg Ercp Biliary & Pancreatic Ducts  05/15/2015  CLINICAL DATA:  Sphincterotomy EXAM: ERCP TECHNIQUE: Multiple spot images obtained with the fluoroscopic device and submitted for interpretation post-procedure. FLUOROSCOPY TIME:  Radiation Exposure Index (as provided by the fluoroscopic device): If the device does not provide the exposure index: Fluoroscopy Time:  8 minutes and 42 seconds Number of Acquired Images:  8 COMPARISON:  None. FINDINGS: Contrast fills the biliary tree. A stent has been placed across the distal common bile duct into the duodenum. IMPRESSION: See above. These images were submitted for radiologic interpretation only. Please see the procedural report for the amount of contrast and the fluoroscopy time utilized. Electronically Signed   By: Marybelle Killings M.D.   On: 05/15/2015 16:26    Microbiology: No results found for this or any previous visit (from the past 240 hour(s)).   Labs: Basic Metabolic Panel:  Recent Labs Lab 05/14/15 0902 05/15/15 [id number] 05/15/15 1221 05/16/15 0549 05/17/15 0427 05/18/15 0657 05/19/15 0608  NA  --  142 141 139 140 138 138  K  --  2.6* 2.9* 3.4* 3.2* 3.5 3.6  CL  --  106  --  104 104 102 103  CO2  --  23  --  25 25 25 23   GLUCOSE  --  108* 106* 103* 107* 103* 108*  BUN  --  8  --  7 6 8 11   CREATININE  --  0.44*  --  0.52* 0.55* 0.55* 0.69  CALCIUM  --  8.2*  --  8.0* 8.1* 8.3* 8.2*  MG 1.9 2.0  --   --   --   --   --    Liver Function Tests:  Recent Labs Lab 05/15/15 0623  05/16/15 0549 05/17/15 0427 05/18/15 0657 05/19/15 0608  AST 154* 173* 145* 136* 132*  ALT 71* 73* 72* 72* 68*  ALKPHOS 458* 458* 444* 445* 395*  BILITOT 16.1* 16.6* 14.5* 13.8* 11.3*  PROT 6.6 6.1* 6.3* 6.9 6.6  ALBUMIN 2.2* 2.0* 2.0* 2.2* 2.1*    Recent Labs Lab 05/13/15 2201  LIPASE 35    Recent Labs Lab 05/14/15 0138  AMMONIA 48*   CBC:  Recent Labs Lab 05/13/15 2201 05/15/15 0623 05/15/15 1221  WBC 8.3 8.2  --   HGB 12.9* 11.6* 13.3  HCT 39.2 34.7* 39.0  MCV 88.9 90.4  --   PLT 437* 364  --    Cardiac Enzymes: No results for input(s): CKTOTAL, CKMB, CKMBINDEX, TROPONINI in the last 168 hours. BNP: BNP (last 3 results) No results for input(s): BNP in the last 8760 hours.  ProBNP (last 3 results) No results for input(s): PROBNP in the last 8760 hours.  CBG:  Recent Labs Lab 05/16/15 0747 05/17/15 0721 05/18/15 0804 05/19/15 0721  GLUCAP 89 109* 96 92       Signed:  Louellen Molder MD.  [facility] 05/19/2015, 2:18 PM

## 2015-05-19 NOTE — Care Management Note (Signed)
Case Management Note  Patient Details  Name: Ryan Roach MRN: [id number] Date of Birth: November 15, 1945  Subjective/Objective:           Admitted with jaundice, pancreatic mass         Action/Plan: Spoke with patient and his wife about discharge plan. PT recommending HHPT, gave choice, they selected [facility] which they have worked with previously. Contacted Susan at [facility] and set up [place]. Patient has a rolling walker and cane, no equipment needs identified. Wife will be assisting patient after discharge.   Expected Discharge Date:                  Expected Discharge Plan:  [place]  In-House Referral:  NA  Discharge planning Services  CM Consult  Post Acute Care Choice:  Home Health Choice offered to:  Patient  DME Arranged:  N/A DME Agency:     HH Arranged:  PT [place]:  [place]  Status of Service:  Completed, signed off  Medicare Important Message Given:  Yes Date Medicare IM Given:    Medicare IM give by:    Date Additional Medicare IM Given:    Additional Medicare Important Message give by:     If discussed at [place] of Stay Meetings, dates discussed:    Additional Comments:  Nila Nephew, RN 05/19/2015, 4:01 PM

## 2015-05-20 ENCOUNTER — Telehealth: Payer: Self-pay

## 2015-05-20 ENCOUNTER — Encounter (HOSPITAL_COMMUNITY): Payer: Self-pay | Admitting: *Deleted

## 2015-05-20 NOTE — Telephone Encounter (Signed)
Order already in epic

## 2015-05-20 NOTE — Telephone Encounter (Signed)
-----   Message from Manus Gunning, MD sent at 05/19/2015  6:14 PM EDT ----- Hi, This patient was seen by Dr. [name] while inpatient and got discharged today. He needs follow up LFTs on Wed to ensure downtrending. I think Theodis Aguas coordinated this but wanted to close the loop and ensure this is ordered for the patient, he will come to the lab on Wed for hepatic panel. Thanks

## 2015-05-21 ENCOUNTER — Other Ambulatory Visit (INDEPENDENT_AMBULATORY_CARE_PROVIDER_SITE_OTHER): Payer: PPO

## 2015-05-21 DIAGNOSIS — R17 Unspecified jaundice: Secondary | ICD-10-CM

## 2015-05-21 DIAGNOSIS — K8689 Other specified diseases of pancreas: Secondary | ICD-10-CM

## 2015-05-21 DIAGNOSIS — K869 Disease of pancreas, unspecified: Secondary | ICD-10-CM | POA: Diagnosis not present

## 2015-05-21 LAB — HEPATIC FUNCTION PANEL
ALBUMIN: 3.3 g/dL — AB (ref 3.5–5.2)
ALT: 86 U/L — ABNORMAL HIGH (ref 0–53)
AST: 165 U/L — AB (ref 0–37)
Alkaline Phosphatase: 456 U/L — ABNORMAL HIGH (ref 39–117)
Bilirubin, Direct: 5.6 mg/dL — ABNORMAL HIGH (ref 0.0–0.3)
Total Bilirubin: 10.6 mg/dL — ABNORMAL HIGH (ref 0.2–1.2)
Total Protein: 7.6 g/dL (ref 6.0–8.3)

## 2015-05-21 LAB — IGG 4: IGG 4: 19 mg/dL (ref 1–291)

## 2015-05-26 ENCOUNTER — Other Ambulatory Visit: Payer: Self-pay | Admitting: Hematology & Oncology

## 2015-05-26 DIAGNOSIS — C250 Malignant neoplasm of head of pancreas: Secondary | ICD-10-CM

## 2015-05-28 ENCOUNTER — Ambulatory Visit: Payer: PPO | Admitting: Hematology & Oncology

## 2015-05-28 ENCOUNTER — Other Ambulatory Visit: Payer: PPO

## 2015-05-29 ENCOUNTER — Encounter (HOSPITAL_COMMUNITY)
Admission: RE | Disposition: A | Discharge status: Home / Self Care | Payer: Self-pay | Source: Ambulatory Visit | Attending: Gastroenterology

## 2015-05-29 ENCOUNTER — Ambulatory Visit (HOSPITAL_COMMUNITY): Payer: PPO | Admitting: Anesthesiology

## 2015-05-29 ENCOUNTER — Encounter (HOSPITAL_COMMUNITY): Payer: Self-pay

## 2015-05-29 ENCOUNTER — Telehealth: Payer: Self-pay

## 2015-05-29 ENCOUNTER — Ambulatory Visit (HOSPITAL_COMMUNITY): Payer: PPO

## 2015-05-29 ENCOUNTER — Ambulatory Visit (HOSPITAL_COMMUNITY)
Admission: RE | Admit: 2015-05-29 | Discharge: 2015-05-29 | Disposition: A | Discharge status: Home / Self Care | Payer: PPO | Source: Ambulatory Visit | Attending: Gastroenterology | Admitting: Gastroenterology

## 2015-05-29 DIAGNOSIS — K8689 Other specified diseases of pancreas: Secondary | ICD-10-CM

## 2015-05-29 DIAGNOSIS — K8050 Calculus of bile duct without cholangitis or cholecystitis without obstruction: Secondary | ICD-10-CM | POA: Diagnosis not present

## 2015-05-29 DIAGNOSIS — K8020 Calculus of gallbladder without cholecystitis without obstruction: Secondary | ICD-10-CM | POA: Diagnosis not present

## 2015-05-29 DIAGNOSIS — R933 Abnormal findings on diagnostic imaging of other parts of digestive tract: Secondary | ICD-10-CM | POA: Diagnosis not present

## 2015-05-29 DIAGNOSIS — M1990 Unspecified osteoarthritis, unspecified site: Secondary | ICD-10-CM | POA: Insufficient documentation

## 2015-05-29 DIAGNOSIS — K831 Obstruction of bile duct: Secondary | ICD-10-CM

## 2015-05-29 DIAGNOSIS — Z6841 Body Mass Index (BMI) 40.0 and over, adult: Secondary | ICD-10-CM | POA: Insufficient documentation

## 2015-05-29 DIAGNOSIS — R17 Unspecified jaundice: Secondary | ICD-10-CM

## 2015-05-29 DIAGNOSIS — K8051 Calculus of bile duct without cholangitis or cholecystitis with obstruction: Secondary | ICD-10-CM | POA: Diagnosis not present

## 2015-05-29 HISTORY — DX: Anxiety disorder, unspecified: F41.9

## 2015-05-29 HISTORY — PX: ENDOSCOPIC RETROGRADE CHOLANGIOPANCREATOGRAPHY (ERCP) WITH PROPOFOL: SHX5810

## 2015-05-29 HISTORY — PX: EUS: SHX5427

## 2015-05-29 LAB — HEPATIC FUNCTION PANEL
ALT: 103 U/L — AB (ref 17–63)
AST: 150 U/L — AB (ref 15–41)
Albumin: 3.1 g/dL — ABNORMAL LOW (ref 3.5–5.0)
Alkaline Phosphatase: 306 U/L — ABNORMAL HIGH (ref 38–126)
BILIRUBIN DIRECT: 3.5 mg/dL — AB (ref 0.1–0.5)
BILIRUBIN TOTAL: 7.1 mg/dL — AB (ref 0.3–1.2)
Indirect Bilirubin: 3.6 mg/dL — ABNORMAL HIGH (ref 0.3–0.9)
Total Protein: 7.7 g/dL (ref 6.5–8.1)

## 2015-05-29 SURGERY — UPPER ENDOSCOPIC ULTRASOUND (EUS) LINEAR
Anesthesia: Monitor Anesthesia Care

## 2015-05-29 MED ORDER — PHENYLEPHRINE 40 MCG/ML (10ML) SYRINGE FOR IV PUSH (FOR BLOOD PRESSURE SUPPORT)
PREFILLED_SYRINGE | INTRAVENOUS | Status: AC
  Filled 2015-05-29: qty 10

## 2015-05-29 MED ORDER — LIDOCAINE HCL (CARDIAC) 20 MG/ML IV SOLN
INTRAVENOUS | Status: DC | PRN
  Administered 2015-05-29: 60 mg via INTRAVENOUS

## 2015-05-29 MED ORDER — ONDANSETRON HCL 4 MG/2ML IJ SOLN
INTRAMUSCULAR | Status: AC
  Filled 2015-05-29: qty 2

## 2015-05-29 MED ORDER — ROCURONIUM BROMIDE 100 MG/10ML IV SOLN
INTRAVENOUS | Status: AC
Start: 2015-05-29 — End: 2015-05-29
  Filled 2015-05-29: qty 1

## 2015-05-29 MED ORDER — PHENYLEPHRINE HCL 10 MG/ML IJ SOLN
INTRAMUSCULAR | Status: DC | PRN
  Administered 2015-05-29: 80 ug via INTRAVENOUS
  Administered 2015-05-29: 120 ug via INTRAVENOUS
  Administered 2015-05-29 (×2): 80 ug via INTRAVENOUS

## 2015-05-29 MED ORDER — MIDAZOLAM HCL 2 MG/2ML IJ SOLN
INTRAMUSCULAR | Status: AC
  Filled 2015-05-29: qty 2

## 2015-05-29 MED ORDER — MIDAZOLAM HCL 5 MG/5ML IJ SOLN
INTRAMUSCULAR | Status: DC | PRN
  Administered 2015-05-29: 1 mg via INTRAVENOUS

## 2015-05-29 MED ORDER — ONDANSETRON HCL 4 MG/2ML IJ SOLN
INTRAMUSCULAR | Status: DC | PRN
  Administered 2015-05-29: 4 mg via INTRAVENOUS

## 2015-05-29 MED ORDER — SODIUM CHLORIDE 0.9 % IV SOLN: INTRAVENOUS | Status: DC

## 2015-05-29 MED ORDER — INDOMETHACIN 50 MG RE SUPP
RECTAL | Status: AC
  Filled 2015-05-29: qty 2

## 2015-05-29 MED ORDER — FENTANYL CITRATE (PF) 100 MCG/2ML IJ SOLN
INTRAMUSCULAR | Status: DC | PRN
  Administered 2015-05-29 (×2): 50 ug via INTRAVENOUS

## 2015-05-29 MED ORDER — FENTANYL CITRATE (PF) 100 MCG/2ML IJ SOLN
INTRAMUSCULAR | Status: AC
  Filled 2015-05-29: qty 2

## 2015-05-29 MED ORDER — ROCURONIUM BROMIDE 100 MG/10ML IV SOLN
INTRAVENOUS | Status: DC | PRN
  Administered 2015-05-29: 5 mg via INTRAVENOUS

## 2015-05-29 MED ORDER — CIPROFLOXACIN IN D5W 400 MG/200ML IV SOLN
INTRAVENOUS | Status: DC | PRN
  Administered 2015-05-29: 400 mg via INTRAVENOUS

## 2015-05-29 MED ORDER — CIPROFLOXACIN IN D5W 400 MG/200ML IV SOLN
INTRAVENOUS | Status: AC
  Filled 2015-05-29: qty 200

## 2015-05-29 MED ORDER — LACTATED RINGERS IV SOLN
INTRAVENOUS | Status: DC
  Administered 2015-05-29 (×2): via INTRAVENOUS

## 2015-05-29 MED ORDER — PROPOFOL 10 MG/ML IV BOLUS
INTRAVENOUS | Status: AC
  Filled 2015-05-29: qty 20

## 2015-05-29 MED ORDER — SUCCINYLCHOLINE CHLORIDE 20 MG/ML IJ SOLN
INTRAMUSCULAR | Status: DC | PRN
  Administered 2015-05-29: 100 mg via INTRAVENOUS

## 2015-05-29 MED ORDER — GLYCOPYRROLATE 0.2 MG/ML IJ SOLN
INTRAMUSCULAR | Status: DC | PRN
  Administered 2015-05-29: 0.2 mg via INTRAVENOUS

## 2015-05-29 MED ORDER — GLYCOPYRROLATE 0.2 MG/ML IJ SOLN
INTRAMUSCULAR | Status: AC
  Filled 2015-05-29: qty 1

## 2015-05-29 MED ORDER — INDOMETHACIN 50 MG RE SUPP
100.0000 mg | Freq: Once | RECTAL | Status: AC
  Administered 2015-05-29: 100 mg via RECTAL

## 2015-05-29 MED ORDER — GLUCAGON HCL RDNA (DIAGNOSTIC) 1 MG IJ SOLR
INTRAMUSCULAR | Status: AC
  Filled 2015-05-29: qty 1

## 2015-05-29 MED ORDER — PROPOFOL 10 MG/ML IV BOLUS
INTRAVENOUS | Status: DC | PRN
  Administered 2015-05-29: 150 mg via INTRAVENOUS

## 2015-05-29 NOTE — Discharge Instructions (Signed)
YOU HAD AN ENDOSCOPIC PROCEDURE TODAY: Refer to the procedure report that was given to you for any specific questions about what was found during the examination.  If the procedure report does not answer your questions, please call your gastroenterologist to clarify. ° °YOU SHOULD EXPECT: Some feelings of bloating in the abdomen. Passage of more gas than usual.  Walking can help get rid of the air that was put into your GI tract during the procedure and reduce the bloating. If you had a lower endoscopy (such as a colonoscopy or flexible sigmoidoscopy) you may notice spotting of blood in your stool or on the toilet paper.  ° °DIET: Your first meal following the procedure should be a light meal and then it is ok to progress to your normal diet.  A half-sandwich or bowl of soup is an example of a good first meal.  Heavy or fried foods are harder to digest and may make you feel nasueas or bloated.  Drink plenty of fluids but you should avoid alcoholic beverages for 24 hours. ° °ACTIVITY: Your care partner should take you home directly after the procedure.  You should plan to take it easy, moving slowly for the rest of the day.  You can resume normal activity the day after the procedure however you should NOT DRIVE or use heavy machinery for 24 hours (because of the sedation medicines used during the test).   ° °SYMPTOMS TO REPORT IMMEDIATELY  °A gastroenterologist can be reached at any hour.  Please call your doctor's office for any of the following symptoms: ° °· Following lower endoscopy (colonoscopy, flexible sigmoidoscopy) ° Excessive amounts of blood in the stool ° Significant tenderness, worsening of abdominal pains ° Swelling of the abdomen that is new, acute ° Fever of 100° or higher °· Following upper endoscopy (EGD, EUS, ERCP) ° Vomiting of blood or coffee ground material ° New, significant abdominal pain ° New, significant chest pain or pain under the shoulder blades ° Painful or persistently difficult  swallowing ° New shortness of breath ° Black, tarry-looking stools ° °FOLLOW UP: °If any biopsies were taken you will be contacted by phone or by letter within the next 1-3 weeks.  Call your gastroenterologist if you have not heard about the biopsies in 3 weeks.  °Please also call your gastroenterologist's office with any specific questions about appointments or follow up tests. ° ° °Monitored Anesthesia Care °Monitored anesthesia care is an anesthesia service for a medical procedure. Anesthesia is the loss of the ability to feel pain. It is produced by medicines called anesthetics. It may affect a small area of your body (local anesthesia), a large area of your body (regional anesthesia), or your entire body (general anesthesia). The need for monitored anesthesia care depends your procedure, your condition, and the potential need for regional or general anesthesia. It is often provided during procedures where:  °· General anesthesia may be needed if there are complications. This is because you need special care when you are under general anesthesia.   °· You will be under local or regional anesthesia. This is so that you are able to have higher levels of anesthesia if needed.   °· You will receive calming medicines (sedatives). This is especially the case if sedatives are given to put you in a semi-conscious state of relaxation (deep sedation). This is because the amount of sedative needed to produce this state can be hard to predict. Too much of a sedative can produce general anesthesia. °Monitored anesthesia care   is performed by one or more health care providers who have special training in all types of anesthesia. You will need to meet with these health care providers before your procedure. During this meeting, they will ask you about your medical history. They will also give you instructions to follow. (For example, you will need to stop eating and drinking before your procedure. You may also need to stop or  change medicines you are taking.) During your procedure, your health care providers will stay with you. They will:  °· Watch your condition. This includes watching your blood pressure, breathing, and level of pain.   °· Diagnose and treat problems that occur.   °· Give medicines if they are needed. These may include calming medicines (sedatives) and anesthetics.   °· Make sure you are comfortable.   °Having monitored anesthesia care does not necessarily mean that you will be under anesthesia. It does mean that your health care providers will be able to manage anesthesia if you need it or if it occurs. It also means that you will be able to have a different type of anesthesia than you are having if you need it. When your procedure is complete, your health care providers will continue to watch your condition. They will make sure any medicines wear off before you are allowed to go home.  °  °This information is not intended to replace advice given to you by your health care provider. Make sure you discuss any questions you have with your health care provider. °  °Document Released: 10/21/2004 Document Revised: 02/15/2014 Document Reviewed: 03/08/2012 °Elsevier Interactive Patient Education ©2016 [employer] ° °

## 2015-05-29 NOTE — Op Note (Signed)
[facility] Patient Name: Ryan Roach Procedure Date: 05/29/2015 MRN: [id number] Attending MD: Milus Banister , MD Date of Birth: Jan 11, 1946 CSN:  Age: 70 Admit Type: Outpatient Procedure:                Upper EUS Indications:              Suspected mass in pancreas on CT scan, presented                            with relatively painless jaundice without weight                            loss; TBili max 16, CT suggested pancreatic mass,                            dilated intra and extrahepatic biliary tree; ERCP                            with Dr. [name] found relative distal CBD stricture                            (brushing neg for malignancy) with CBD stone                            proximal to the stricture that was removed after                            biliary sphincterotomy; LFTs have been slow to                            improve; last week T bili 10. LFTs just prior to                            this procedure was hemolyzed. Providers:                Milus Banister, MD, Carolynn Comment, RN, Elspeth Cho, Technician Referring MD:             Wilfrid Lund, MD Medicines:                General Anesthesia Complications:            No immediate complications. Estimated blood loss:                            None. Estimated Blood Loss:     Estimated blood loss: none. Procedure:                Pre-Anesthesia Assessment:                           - Prior to the procedure, a History and Physical  was performed, and patient medications and                            allergies were reviewed. The patient's tolerance of                            previous anesthesia was also reviewed. The risks                            and benefits of the procedure and the sedation                            options and risks were discussed with the patient.                            All questions were answered, and  informed consent                            was obtained. Prior Anticoagulants: The patient has                            taken no previous anticoagulant or antiplatelet                            agents. ASA Grade Assessment: II - A patient with                            mild systemic disease. After reviewing the risks                            and benefits, the patient was deemed in                            satisfactory condition to undergo the procedure.                           After obtaining informed consent, the endoscope was                            passed under direct vision. Throughout the                            procedure, the patient's blood pressure, pulse, and                            oxygen saturations were monitored continuously. The                            [id number] scope was introduced through                            the mouth, and advanced to the second part of  duodenum. The [id number]) scope was                            introduced through the mouth, and advanced to the                            second part of duodenum. The upper EUS was                            accomplished without difficulty. The patient                            tolerated the procedure well. Scope In: Scope Out: Findings:      Endoscopic Finding :      1. A previously placed plastic stent was seen in the ampulla. The stent       lumen appeared to be plugged with biodebris. The previously placed       biliary stent was removed with a snare (sent to cytology). Two small       stone fragments were passed into the duodenum with removal of the       plastic stent.      2. The UGI tract was otherwise normal.      Endosonographic Finding :      1. The pancreatic parenchyma was normal throughout the gland; there were       no discrete masses or signs of chronic pancreatitis      2. The wall of the distal 2-3cm of the CBD was somewhat  thickened       appearing, circumferentially. Proximal to this the CBD was normal       appearing, diameter 5-48mm across and contained no obvious stones.      3. No peripancreatic adenopathy      4. Thickened gallbladder wall (3-61mm)      5. Limited views of liver, spleen, portal and splenic vessels were all       normal. Impression:               - Plastic stent in the duodenum.                           - There was no sign of significant pathology in the                            pancreatic head.                           - No specimens collected. Moderate Sedation:      N/A- Per Anesthesia Care Recommendation:           - Will proceed with ERCP now. Procedure Code(s):        --- Professional ---                           [phone], Esophagogastroduodenoscopy, flexible,                            transoral; with endoscopic ultrasound examination  limited to the esophagus, stomach or duodenum, and                            adjacent structures Diagnosis Code(s):        --- Professional ---                           R93.3, Abnormal findings on diagnostic imaging of                            other parts of digestive tract CPT copyright 2016 American [employer]. All rights reserved. The codes documented in this report are preliminary and upon coder review may  be revised to meet current compliance requirements. Milus Banister, MD 05/29/2015 1:09:47 PM This report has been signed electronically. Number of Addenda: 0

## 2015-05-29 NOTE — Interval H&P Note (Signed)
History and Physical Interval Note:  05/29/2015 10:53 AM  Ryan Roach  has presented today for surgery, with the diagnosis of biliary obstruction  The various methods of treatment have been discussed with the patient and family. After consideration of risks, benefits and other options for treatment, the patient has consented to  Procedure(s): UPPER ENDOSCOPIC ULTRASOUND (EUS) LINEAR (N/A) ENDOSCOPIC RETROGRADE CHOLANGIOPANCREATOGRAPHY (ERCP) WITH PROPOFOL (N/A) as a surgical intervention .  The patient's history has been reviewed, patient examined, no change in status, stable for surgery.  I have reviewed the patient's chart and labs.  Questions were answered to the patient's satisfaction.     Milus Banister

## 2015-05-29 NOTE — Op Note (Signed)
[facility] Patient Name: Ryan Roach Procedure Date: 05/29/2015 MRN: [id number] Attending MD: Milus Banister , MD Date of Birth: 06/15/1945 CSN:  Age: 70 Admit Type: Outpatient Procedure:                ERCP Indications:              SEE EUS procedure report just prior to this                            procedure; previously placed plastic biliary stent                            was removed during that procedure Providers:                Milus Banister, MD, Cleda Daub, RN, Elspeth Cho, Technician Referring MD:             Wilfrid Lund, MD Medicines:                General Anesthesia, Cipro 400 mg IV, Indomethacin                            123XX123 mg PR Complications:            No immediate complications. Estimated blood loss:                            None Estimated Blood Loss:     Estimated blood loss: none. Procedure:                Pre-Anesthesia Assessment:                           - Prior to the procedure, a History and Physical                            was performed, and patient medications and                            allergies were reviewed. The patient's tolerance of                            previous anesthesia was also reviewed. The risks                            and benefits of the procedure and the sedation                            options and risks were discussed with the patient.                            All questions were answered, and informed consent  was obtained. Prior Anticoagulants: The patient has                            taken no previous anticoagulant or antiplatelet                            agents. ASA Grade Assessment: II - A patient with                            mild systemic disease. After reviewing the risks                            and benefits, the patient was deemed in                            satisfactory condition to undergo the procedure.                  After obtaining informed consent, the scope was                            passed under direct vision. Throughout the                            procedure, the patient's blood pressure, pulse, and                            oxygen saturations were monitored continuously. The                            [id number]) scope was introduced through                            the mouthThe ERCP was accomplished without                            difficulty. The patient tolerated the procedure                            well. Scope In: Scope Out: Findings:      The major papilla showed clear evidence of recent biliary       sphincterotomy. A 44 [employer] over a .035hydrawire was used to cannulate       the bile duct and contrast was injected. The distal 3cm of the bile duct       was normal. There was a SLIGHT narrowing of the mid CBD over about 2cm.       Proximal to the slight stricture there were 2-3 floating filling       defects. The bile duct proximal to the slight stricture was normal,       non-dilated appearing. The bile duct was swept several times with a       biliary retrieval balloon, delivering 3 brownish, white stones into the       duodenum. The balloon did not hold up at sight of slight stricture. A       completion, occlusion cholangiogram showed the  slight stricture was       still present and so a cytology brush was used to sample it. I elected       NOT to place a biliary stent following stone removal, brushing. Contrast       flowed into the duodenum without delay Impression:               - Retained CBD stones, removed with balloon                            retrieval.                           - Slight narrowing in the mid-CBD (which allowed                            for easy balloon passage and did not delay contrast                            from passing into the duodenum). The slight                            stricture was sampled with cytology  brush.                           - Underlying etiology MAY simply be stone disease                            given that no masses were noted on EUS just prior                            to this exam. The distal CBD was mildly thickened                            on that exam and there was a slight stricture in                            mid CBD on this exam; he will have to be followed                            closely with repeat liver tests and perhaps repeat                            imaging. LFTs will be checked today (set drawn just                            prior to this exam was hemolyzed).                           - Will likely eventually need gallbladder removal Moderate Sedation:      N/A- Per Anesthesia Care Recommendation:           - Patient has a contact number available for  emergencies. The signs and symptoms of potential                            delayed complications were discussed with the                            patient. Return to normal activities tomorrow.                            Written discharge instructions were provided to the                            patient.                           - Resume previous diet.                           - Will contact you about next lab testing. Procedure Code(s):        --- Professional ---                           [id number], Endoscopic retrograde                            cholangiopancreatography (ERCP); with removal of                            calculi/debris from biliary/pancreatic duct(s) Diagnosis Code(s):        --- Professional ---                           K80.50, Calculus of bile duct without cholangitis                            or cholecystitis without obstruction                           R17, Unspecified jaundice CPT copyright 2016 American Medical Association. All rights reserved. The codes documented in this report are preliminary and upon coder review may  be revised to  meet current compliance requirements. Milus Banister, MD 05/29/2015 1:49:27 PM This report has been signed electronically. Number of Addenda: 0

## 2015-05-29 NOTE — Anesthesia Preprocedure Evaluation (Addendum)
Anesthesia Evaluation  Patient identified by MRN, date of birth, ID band Patient awake    Reviewed: Allergy & Precautions, NPO status , Patient's Chart, lab work & pertinent test results  Airway Mallampati: II  TM Distance: >3 FB Neck ROM: Full    Dental no notable dental hx.    Pulmonary neg pulmonary ROS,    Pulmonary exam normal breath sounds clear to auscultation       Cardiovascular Normal cardiovascular exam Rhythm:Regular Rate:Normal  Study Conclusions  - Left ventricle: The cavity size was normal. Wall thickness was  normal. Systolic function was normal. The estimated ejection  fraction was in the range of 55% to 60%. Regional wall motion  abnormalities cannot be excluded. Doppler parameters are  consistent with abnormal left ventricular relaxation (grade 1  diastolic dysfunction). - Aortic root: The aortic root was mildly dilated.  Impressions:  - Technically difficult; normal LV systolic function; focal wall  motion abnormality cannot be excluded; grade 1 diastolic  dysfunction; trace MR and TR.   Neuro/Psych Anxiety negative neurological ROS     GI/Hepatic negative GI ROS, Neg liver ROS,   Endo/Other  Morbid obesity  Renal/GU negative Renal ROS  negative genitourinary   Musculoskeletal  (+) Arthritis ,   Abdominal (+) + obese,   Peds negative pediatric ROS (+)  Hematology  (+) anemia ,   Anesthesia Other Findings   Reproductive/Obstetrics negative OB ROS                            Anesthesia Physical Anesthesia Plan  ASA: III  Anesthesia Plan: MAC   Post-op Pain Management:    Induction: Intravenous  Airway Management Planned: Natural Airway  Additional Equipment:   Intra-op Plan:   Post-operative Plan:   Informed Consent: I have reviewed the patients History and Physical, chart, labs and discussed the procedure including the risks, benefits and  alternatives for the proposed anesthesia with the patient or authorized representative who has indicated his/her understanding and acceptance.   Dental advisory given  Plan Discussed with: CRNA  Anesthesia Plan Comments:         Anesthesia Quick Evaluation

## 2015-05-29 NOTE — H&P (View-Only) (Signed)
          Daily Rounding Note  05/19/2015, 8:37 AM  LOS: 5 days   SUBJECTIVE:       Still pruritus.  No abdominal pain, no n/v.  Still with anorexia. Still dark urine Overall feels better and wants to go home.   OBJECTIVE:         Vital signs in last 24 hours:    Temp:  [97.7 F (36.5 C)-98.3 F (36.8 C)] 98.3 F (36.8 C) (04/10 0609) Pulse Rate:  [75-79] 78 (04/10 0609) Resp:  [16-18] 18 (04/10 0609) BP: (122-132)/(55-60) 131/57 mmHg (04/10 0609) SpO2:  [99 %] 99 % (04/10 0609) Last BM Date: 05/18/15 Filed Weights   05/15/15 1552  Weight: 144.607 kg (318 lb 12.8 oz)   General: pleasant, less jaudicec   Heart: RRR Chest: clear bil Abdomen: obese, NT, soft  Extremities: no CCE Neuro/Psych:  Pleasant, oriented x 3.  No gross wekness or deficits.  Derm:  Rash on upper back.   Intake/Output from previous day: 04/09 0701 - 04/10 0700 In: 560 [P.O.:560] Out: 125 [Urine:125]  Intake/Output this shift:    Lab Results: No results for input(s): WBC, HGB, HCT, PLT in the last 72 hours. BMET  Recent Labs  05/17/15 0427 05/18/15 0657 05/19/15 0608  NA 140 138 138  K 3.2* 3.5 3.6  CL 104 102 103  CO2 25 25 23   GLUCOSE 107* 103* 108*  BUN 6 8 11   CREATININE 0.55* 0.55* 0.69  CALCIUM 8.1* 8.3* 8.2*   LFT  Recent Labs  05/17/15 0427 05/18/15 0657 05/19/15 0608  PROT 6.3* 6.9 6.6  ALBUMIN 2.0* 2.2* 2.1*  AST 145* 136* 132*  ALT 72* 72* 68*  ALKPHOS 444* 445* 395*  BILITOT 14.5* 13.8* 11.3*   PT/INR  Recent Labs  05/17/15 0427  LABPROT 14.4  INR 1.10   Hepatitis Panel No results for input(s): HEPBSAG, HCVAB, HEPAIGM, HEPBIGM in the last 72 hours.  Studies/Results: No results found.  ASSESMENT:   *  Pancreatic head mass, marked elevated CA19-9 c/w pancreatic cancer.  05/15/15 ERCP/biliary, sphincterotomy and stent placed to CBD stricture.  Duct brushing cytology pending.  LFTs slowly improved,  irregular liver per imaging: rule out cirrhosis.  Liver biopsy planned  *  Coagulopathy, resolved.     PLAN:     *  EUS with FNA set for 4/20, [place] at 10:30.   *  Orders in place for u/s liver biopsy. Pt NPO.   However Dr Annamaria Boots reluctant to perform, due to risk of leak.  Dr Havery Moros will d/w him.         Ryan Roach  05/19/2015, 8:37 AM Pager: [phone]

## 2015-05-29 NOTE — Anesthesia Postprocedure Evaluation (Signed)
Anesthesia Post Note  Patient: Ryan Roach  Procedure(s) Performed: Procedure(s) (LRB): UPPER ENDOSCOPIC ULTRASOUND (EUS) LINEAR (N/A) ENDOSCOPIC RETROGRADE CHOLANGIOPANCREATOGRAPHY (ERCP) WITH PROPOFOL (N/A)  Patient location during evaluation: PACU Anesthesia Type: General Level of consciousness: awake and alert Pain management: pain level controlled Vital Signs Assessment: post-procedure vital signs reviewed and stable Respiratory status: spontaneous breathing, nonlabored ventilation, respiratory function stable and patient connected to nasal cannula oxygen Cardiovascular status: blood pressure returned to baseline and stable Postop Assessment: no signs of nausea or vomiting Anesthetic complications: no    Last Vitals:  Filed Vitals:   05/29/15 1410 05/29/15 1420  BP: 105/52 113/70  Pulse: 99 91  Temp:    Resp: 17 20    Last Pain: There were no vitals filed for this visit.               Carmelo Reidel J

## 2015-05-29 NOTE — Anesthesia Procedure Notes (Signed)
Procedure Name: Intubation Date/Time: 05/29/2015 12:26 PM Performed by: Cynda Familia Pre-anesthesia Checklist: Patient identified, Emergency Drugs available, Suction available and Patient being monitored Patient Re-evaluated:Patient Re-evaluated prior to inductionOxygen Delivery Method: Circle System Utilized Preoxygenation: Pre-oxygenation with 100% oxygen Intubation Type: IV induction Ventilation: Mask ventilation without difficulty Laryngoscope Size: Miller and 2 Grade View: Grade I Tube type: Oral Tube size: 7.0 mm Number of attempts: 1 Airway Equipment and Method: Stylet Placement Confirmation: ETT inserted through vocal cords under direct vision,  positive ETCO2 and breath sounds checked- equal and bilateral Secured at: 22 cm Tube secured with: Tape Dental Injury: Teeth and Oropharynx as per pre-operative assessment  Comments: Smooth IV induction---   Intubation AM CRNA atraumatic- mouth as preop- bilat BS [place]

## 2015-05-29 NOTE — Telephone Encounter (Signed)
LFTs in 1 week per Dr Ardis Hughs order in [facility]

## 2015-05-29 NOTE — Telephone Encounter (Signed)
-----   Message from Milus Banister, MD sent at 05/29/2015  1:54 PM EDT ----- He needs LFTS in one week  thanks

## 2015-05-29 NOTE — Transfer of Care (Signed)
Immediate Anesthesia Transfer of Care Note  Patient: Ryan Roach  Procedure(s) Performed: Procedure(s): UPPER ENDOSCOPIC ULTRASOUND (EUS) LINEAR (N/A) ENDOSCOPIC RETROGRADE CHOLANGIOPANCREATOGRAPHY (ERCP) WITH PROPOFOL (N/A)  Patient Location: PACU  Anesthesia Type:General  Level of Consciousness: awake, alert , oriented and patient cooperative  Airway & Oxygen Therapy: Patient Spontanous Breathing and Patient connected to face mask oxygen  Post-op Assessment: Report given to RN, Post -op Vital signs reviewed and stable and Patient moving all extremities X 4  Post vital signs: stable  Last Vitals:  Filed Vitals:   05/29/15 1053 05/29/15 1348  BP: 155/50 135/58  Pulse: 87   Temp: 36.9 C 37.2 C  Resp: 20     Complications: No apparent anesthesia complications

## 2015-06-03 ENCOUNTER — Encounter (HOSPITAL_COMMUNITY): Payer: Self-pay | Admitting: Gastroenterology

## 2015-06-06 ENCOUNTER — Other Ambulatory Visit: Payer: Self-pay

## 2015-06-06 ENCOUNTER — Other Ambulatory Visit (INDEPENDENT_AMBULATORY_CARE_PROVIDER_SITE_OTHER): Payer: PPO

## 2015-06-06 DIAGNOSIS — K869 Disease of pancreas, unspecified: Secondary | ICD-10-CM | POA: Diagnosis not present

## 2015-06-06 DIAGNOSIS — R7989 Other specified abnormal findings of blood chemistry: Secondary | ICD-10-CM

## 2015-06-06 DIAGNOSIS — K8689 Other specified diseases of pancreas: Secondary | ICD-10-CM

## 2015-06-06 DIAGNOSIS — R945 Abnormal results of liver function studies: Principal | ICD-10-CM

## 2015-06-06 LAB — HEPATIC FUNCTION PANEL
ALBUMIN: 3.7 g/dL (ref 3.5–5.2)
ALT: 77 U/L — ABNORMAL HIGH (ref 0–53)
AST: 101 U/L — ABNORMAL HIGH (ref 0–37)
Alkaline Phosphatase: 259 U/L — ABNORMAL HIGH (ref 39–117)
Bilirubin, Direct: 2 mg/dL — ABNORMAL HIGH (ref 0.0–0.3)
TOTAL PROTEIN: 8 g/dL (ref 6.0–8.3)
Total Bilirubin: 4.3 mg/dL — ABNORMAL HIGH (ref 0.2–1.2)

## 2015-06-13 ENCOUNTER — Other Ambulatory Visit (INDEPENDENT_AMBULATORY_CARE_PROVIDER_SITE_OTHER): Payer: PPO

## 2015-06-13 DIAGNOSIS — R7989 Other specified abnormal findings of blood chemistry: Secondary | ICD-10-CM

## 2015-06-13 DIAGNOSIS — R945 Abnormal results of liver function studies: Principal | ICD-10-CM

## 2015-06-13 LAB — HEPATIC FUNCTION PANEL
ALBUMIN: 4 g/dL (ref 3.5–5.2)
ALK PHOS: 225 U/L — AB (ref 39–117)
ALT: 60 U/L — ABNORMAL HIGH (ref 0–53)
AST: 66 U/L — ABNORMAL HIGH (ref 0–37)
Bilirubin, Direct: 1.2 mg/dL — ABNORMAL HIGH (ref 0.0–0.3)
Total Bilirubin: 3.1 mg/dL — ABNORMAL HIGH (ref 0.2–1.2)
Total Protein: 8.2 g/dL (ref 6.0–8.3)

## 2015-06-16 ENCOUNTER — Encounter: Payer: Self-pay | Admitting: Gastroenterology

## 2015-06-16 ENCOUNTER — Other Ambulatory Visit: Payer: Self-pay

## 2015-06-16 DIAGNOSIS — R7989 Other specified abnormal findings of blood chemistry: Secondary | ICD-10-CM

## 2015-06-16 DIAGNOSIS — R945 Abnormal results of liver function studies: Principal | ICD-10-CM

## 2015-06-27 ENCOUNTER — Other Ambulatory Visit (INDEPENDENT_AMBULATORY_CARE_PROVIDER_SITE_OTHER): Payer: PPO

## 2015-06-27 DIAGNOSIS — R7989 Other specified abnormal findings of blood chemistry: Secondary | ICD-10-CM

## 2015-06-27 DIAGNOSIS — R945 Abnormal results of liver function studies: Principal | ICD-10-CM

## 2015-06-27 LAB — HEPATIC FUNCTION PANEL
ALBUMIN: 3.9 g/dL (ref 3.5–5.2)
ALT: 46 U/L (ref 0–53)
AST: 40 U/L — ABNORMAL HIGH (ref 0–37)
Alkaline Phosphatase: 173 U/L — ABNORMAL HIGH (ref 39–117)
BILIRUBIN TOTAL: 1.7 mg/dL — AB (ref 0.2–1.2)
Bilirubin, Direct: 0.6 mg/dL — ABNORMAL HIGH (ref 0.0–0.3)
Total Protein: 7.9 g/dL (ref 6.0–8.3)

## 2015-06-29 ENCOUNTER — Encounter: Payer: Self-pay | Admitting: Gastroenterology

## 2015-06-30 ENCOUNTER — Other Ambulatory Visit: Payer: Self-pay

## 2015-06-30 DIAGNOSIS — R945 Abnormal results of liver function studies: Principal | ICD-10-CM

## 2015-06-30 DIAGNOSIS — R7989 Other specified abnormal findings of blood chemistry: Secondary | ICD-10-CM

## 2015-08-01 ENCOUNTER — Other Ambulatory Visit (INDEPENDENT_AMBULATORY_CARE_PROVIDER_SITE_OTHER): Payer: PPO

## 2015-08-01 DIAGNOSIS — R7989 Other specified abnormal findings of blood chemistry: Secondary | ICD-10-CM | POA: Diagnosis not present

## 2015-08-01 DIAGNOSIS — R945 Abnormal results of liver function studies: Principal | ICD-10-CM

## 2015-08-01 LAB — HEPATIC FUNCTION PANEL
ALBUMIN: 4 g/dL (ref 3.5–5.2)
ALT: 38 U/L (ref 0–53)
AST: 32 U/L (ref 0–37)
Alkaline Phosphatase: 158 U/L — ABNORMAL HIGH (ref 39–117)
Bilirubin, Direct: 0.2 mg/dL (ref 0.0–0.3)
TOTAL PROTEIN: 7.8 g/dL (ref 6.0–8.3)
Total Bilirubin: 1 mg/dL (ref 0.2–1.2)

## 2015-08-05 ENCOUNTER — Telehealth: Payer: Self-pay | Admitting: Gastroenterology

## 2015-08-05 NOTE — Telephone Encounter (Signed)
Pt aware that Dr Ardis Hughs is out of the office until Monday and the labs have not been reviewed.  I will call him as soon as available.

## 2015-09-19 ENCOUNTER — Telehealth: Payer: Self-pay

## 2015-09-19 DIAGNOSIS — R945 Abnormal results of liver function studies: Secondary | ICD-10-CM

## 2015-09-19 DIAGNOSIS — R7989 Other specified abnormal findings of blood chemistry: Secondary | ICD-10-CM

## 2015-09-19 DIAGNOSIS — K8689 Other specified diseases of pancreas: Secondary | ICD-10-CM

## 2015-09-19 NOTE — Telephone Encounter (Signed)
-----   Message from Barron Alvine, RN sent at 08/08/2015 11:23 AM EDT ----- LFTs in 5-6 weeks AND also an MRI with MRCP in 5-6 weeks. ROV with me shortly afterwards.  See 08/08/15 note

## 2015-09-24 NOTE — Telephone Encounter (Signed)
You have been scheduled for an MRI at [facility] on 10/02/15. Your appointment time is 10 am. Please arrive 15 minutes prior to your appointment time for registration purposes. Please make certain not to have anything to eat or drink 6 hours prior to your test. In addition, if you have any metal in your body, have a pacemaker or defibrillator, please be sure to let your ordering physician know. This test typically takes 45 minutes to 1 hour to complete.   The pt has been notified to have MRI/MRCP and labs.  He verbalized understanding of the instructions.

## 2015-09-30 ENCOUNTER — Other Ambulatory Visit (INDEPENDENT_AMBULATORY_CARE_PROVIDER_SITE_OTHER): Payer: PPO

## 2015-09-30 DIAGNOSIS — K869 Disease of pancreas, unspecified: Secondary | ICD-10-CM

## 2015-09-30 DIAGNOSIS — R7989 Other specified abnormal findings of blood chemistry: Secondary | ICD-10-CM

## 2015-09-30 DIAGNOSIS — K8689 Other specified diseases of pancreas: Secondary | ICD-10-CM

## 2015-09-30 DIAGNOSIS — R945 Abnormal results of liver function studies: Secondary | ICD-10-CM

## 2015-09-30 LAB — HEPATIC FUNCTION PANEL
ALK PHOS: 162 U/L — AB (ref 39–117)
ALT: 31 U/L (ref 0–53)
AST: 28 U/L (ref 0–37)
Albumin: 4.2 g/dL (ref 3.5–5.2)
BILIRUBIN DIRECT: 0.3 mg/dL (ref 0.0–0.3)
BILIRUBIN TOTAL: 1.4 mg/dL — AB (ref 0.2–1.2)
Total Protein: 8.2 g/dL (ref 6.0–8.3)

## 2015-09-30 LAB — CREATININE, SERUM: CREATININE: 0.66 mg/dL (ref 0.40–1.50)

## 2015-09-30 LAB — BUN: BUN: 13 mg/dL (ref 6–23)

## 2015-10-02 ENCOUNTER — Other Ambulatory Visit: Payer: Self-pay | Admitting: Gastroenterology

## 2015-10-02 ENCOUNTER — Ambulatory Visit (HOSPITAL_COMMUNITY)
Admission: RE | Admit: 2015-10-02 | Discharge: 2015-10-02 | Disposition: A | Discharge status: Home / Self Care | Payer: PPO | Source: Ambulatory Visit | Attending: Gastroenterology | Admitting: Gastroenterology

## 2015-10-02 ENCOUNTER — Telehealth: Payer: Self-pay | Admitting: Gastroenterology

## 2015-10-02 DIAGNOSIS — R945 Abnormal results of liver function studies: Principal | ICD-10-CM

## 2015-10-02 DIAGNOSIS — K8020 Calculus of gallbladder without cholecystitis without obstruction: Secondary | ICD-10-CM

## 2015-10-02 DIAGNOSIS — K8689 Other specified diseases of pancreas: Secondary | ICD-10-CM

## 2015-10-02 DIAGNOSIS — R7989 Other specified abnormal findings of blood chemistry: Secondary | ICD-10-CM

## 2015-10-03 NOTE — Telephone Encounter (Signed)
You have been scheduled for an MRI at [facility] , they will call the pt and set up and give instructions.

## 2015-10-17 ENCOUNTER — Other Ambulatory Visit: Payer: PPO

## 2015-10-17 ENCOUNTER — Ambulatory Visit
Admission: RE | Admit: 2015-10-17 | Discharge: 2015-10-17 | Disposition: A | Discharge status: Home / Self Care | Payer: PPO | Source: Ambulatory Visit | Attending: Gastroenterology | Admitting: Gastroenterology

## 2015-10-17 DIAGNOSIS — R7989 Other specified abnormal findings of blood chemistry: Secondary | ICD-10-CM

## 2015-10-17 DIAGNOSIS — R945 Abnormal results of liver function studies: Principal | ICD-10-CM

## 2015-10-17 DIAGNOSIS — R109 Unspecified abdominal pain: Secondary | ICD-10-CM | POA: Diagnosis not present

## 2015-10-17 MED ORDER — GADOBENATE DIMEGLUMINE 529 MG/ML IV SOLN
20.0000 mL | Freq: Once | INTRAVENOUS | Status: AC | PRN
  Administered 2015-10-17: 20 mL via INTRAVENOUS

## 2015-10-21 ENCOUNTER — Other Ambulatory Visit: Payer: Self-pay

## 2015-10-21 DIAGNOSIS — R7989 Other specified abnormal findings of blood chemistry: Secondary | ICD-10-CM

## 2015-10-21 DIAGNOSIS — R945 Abnormal results of liver function studies: Principal | ICD-10-CM

## 2015-12-01 ENCOUNTER — Other Ambulatory Visit (INDEPENDENT_AMBULATORY_CARE_PROVIDER_SITE_OTHER): Payer: PPO

## 2015-12-01 DIAGNOSIS — R945 Abnormal results of liver function studies: Principal | ICD-10-CM

## 2015-12-01 DIAGNOSIS — R7989 Other specified abnormal findings of blood chemistry: Secondary | ICD-10-CM | POA: Diagnosis not present

## 2015-12-01 LAB — HEPATIC FUNCTION PANEL
ALK PHOS: 147 U/L — AB (ref 39–117)
ALT: 30 U/L (ref 0–53)
AST: 27 U/L (ref 0–37)
Albumin: 4.2 g/dL (ref 3.5–5.2)
BILIRUBIN DIRECT: 0.2 mg/dL (ref 0.0–0.3)
BILIRUBIN TOTAL: 0.9 mg/dL (ref 0.2–1.2)
TOTAL PROTEIN: 8 g/dL (ref 6.0–8.3)

## 2015-12-03 ENCOUNTER — Ambulatory Visit (INDEPENDENT_AMBULATORY_CARE_PROVIDER_SITE_OTHER): Payer: PPO | Admitting: Gastroenterology

## 2015-12-03 ENCOUNTER — Encounter: Payer: Self-pay | Admitting: Gastroenterology

## 2015-12-03 VITALS — BP 138/70 | HR 62 | Ht 71.0 in | Wt 313.0 lb

## 2015-12-03 DIAGNOSIS — R945 Abnormal results of liver function studies: Principal | ICD-10-CM

## 2015-12-03 DIAGNOSIS — R7989 Other specified abnormal findings of blood chemistry: Secondary | ICD-10-CM | POA: Diagnosis not present

## 2015-12-03 DIAGNOSIS — K625 Hemorrhage of anus and rectum: Secondary | ICD-10-CM | POA: Diagnosis not present

## 2015-12-03 MED ORDER — NA SULFATE-K SULFATE-MG SULF 17.5-3.13-1.6 GM/177ML PO SOLN: 1.0000 | Freq: Once | ORAL | 0 refills | Status: AC

## 2015-12-03 NOTE — Patient Instructions (Signed)
You will be set up for a colonoscopy for minor rectal bleeding, screening ([facility], [facility], next available Thursday) LFTs in 6 months. MRI abdomen with MRCP in 6 months.

## 2015-12-03 NOTE — Progress Notes (Signed)
Review of pertinent gastrointestinal problems: 1. Painless jaundice 05/2015: Suspected mass in pancreas on CT scan, presented with relatively painless jaundice without weight  loss; TBili max 16, CT suggested pancreatic mass, dilated intra and extrahepatic biliary tree; ERCP with Dr. 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 LFTs just prior to this procedure was hemolyzed. EUS/ERCP Dr. Ardis Hughs 2 weeks later: removed previous plastic stent (cytology no obvious malignant cells); only mild narrowing of distal CBD, stone particles removed. NO obvious masses, tumors.  Elected to NOT replace stent and observe him clinically: Serial LFTs have shown nearly complete normalization. Repeat imaging MRI/MRCP 10/2015 normal CBD, no masses/stones in bile duct or pancreas.   HPI: This is a   very pleasant 70 year old man whom I last saw the time of EUS ERCP about 6 months ago.    Chief complaint is abnormal liver tests, minor rectal bleeding  Has abdominal discomfort postprandially only with certain foods.  Overall stable weight.  No colonoscopy in >10 years  Does have minor rectal bleeding.   ROS: complete GI ROS as described in HPI.  Constitutional:  No unintentional weight loss   Past Medical History:  Diagnosis Date  . Anxiety   . Arthritis   . 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 Pancreatic mass 05/14/15    Past Surgical History:  Procedure Laterality Date  . ENDOSCOPIC RETROGRADE CHOLANGIOPANCREATOGRAPHY (ERCP) WITH PROPOFOL N/A 05/29/2015   Procedure: ENDOSCOPIC RETROGRADE CHOLANGIOPANCREATOGRAPHY (ERCP) WITH PROPOFOL;  Surgeon: Milus Banister, MD;  Location: [facility];  Service: Endoscopy;  Laterality: N/A;  . ERCP N/A 05/15/2015    Procedure: ENDOSCOPIC RETROGRADE CHOLANGIOPANCREATOGRAPHY (ERCP);  Surgeon: Doran Stabler, MD;  Location: [facility] ENDOSCOPY;  Service: Endoscopy;  Laterality: N/A;  . EUS N/A 05/29/2015   Procedure: UPPER ENDOSCOPIC ULTRASOUND (EUS) LINEAR;  Surgeon: Milus Banister, MD;  Location: [facility];  Service: Endoscopy;  Laterality: N/A;  . JOINT REPLACEMENT     RTKA  . TONSILLECTOMY    . TOTAL KNEE ARTHROPLASTY Right 05/25/2012   Procedure: RIGHT TOTAL KNEE ARTHROPLASTY;  Surgeon: Tobi Bastos, MD;  Location: [facility]S;  Service: Orthopedics;  Laterality: Right;    Current Outpatient Prescriptions  Medication Sig Dispense Refill  . acetaminophen (TYLENOL) 500 MG tablet Take 1,000 mg by mouth every 4 (four) hours as needed. Take every day per patient    . aspirin 325 MG tablet Take 325 mg by mouth daily.    . cholestyramine light (PREVALITE) 4 g packet Take 1 packet (4 g total) by mouth 2 (two) times daily. (Patient not taking: Reported on 12/03/2015) 20 packet 0  . pneumococcal 23 valent vaccine (PNU-IMMUNE) 25 MCG/0.5ML injection Inject 0.5 mLs into the muscle once.    . Pyridoxine HCl (VITAMIN B-6 PO) Take 1 tablet by mouth daily.    . tamsulosin (FLOMAX) 0.4 MG CAPS capsule Take 1 capsule (0.4 mg total) by mouth daily after supper. (Patient not taking: Reported on 12/03/2015) 30 capsule 0   No current facility-administered medications for this visit.     Allergies as of 12/03/2015  . (No Known Allergies)    Family History  Problem Relation Age of Onset  . Breast cancer Mother     Social History   Social History  . Marital  status: Married    Spouse name: N/A  . Number of children: N/A  . Years of education: N/A   Occupational History  . school driver    Social History Main Topics  . Smoking status: Never Smoker  . Smokeless tobacco: Current User    Types: Chew  . Alcohol use No  . Drug use: No  . Sexual activity: Not on file   Other Topics Concern  . Not on file    Social History Narrative  . No narrative on file     Physical Exam: BP 138/70   Pulse 62   Ht 5\' 11"  (1.803 m)   Wt (!) 313 lb (142 kg)   BMI 43.65 kg/m  Constitutional: generally well-appearing Psychiatric: alert and oriented x3 Abdomen: soft, nontender, nondistended, no obvious ascites, no peritoneal signs, normal bowel sounds No peripheral edema noted in lower extremities  Assessment and plan: 70 y.o. male with markedly improved liver tests, minor rectal bleeding  I think time has proven that he does not have pancreaticobiliary malignancy as was first feared when he presented with painless jaundice 6 months ago. 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 Either way for liver tests are nearly completely normal now with only very slightly elevated alkaline phosphatase and this is without bile duct stent in place for the past 6 months. Also repeat MRI last month was essentially normal. Going to follow his liver tests with a repeat set in 3 months from now and we will obtain 1 more cross-sectional imaging tests in about 6 months from now, MRI with MRCP to ensure we have not missed anything. His wife mentioned he has minor rectal bleeding at times he has not had colonoscopy in 10 years and so we will proceed with colonoscopy for minor rectal bleeding and screening at his soonest convenience.   Owens Loffler, MD [facility] 12/03/2015, 9:48 AM

## 2015-12-12 ENCOUNTER — Telehealth: Payer: Self-pay | Admitting: Gastroenterology

## 2015-12-12 NOTE — Telephone Encounter (Signed)
Pharmacy called and coupon given for no more than $50.  I called the pt but his voice mail has not been set up.  I will try and call later

## 2015-12-15 NOTE — Telephone Encounter (Signed)
As been advised and will call with further concerns

## 2015-12-17 ENCOUNTER — Encounter (HOSPITAL_COMMUNITY): Payer: Self-pay | Admitting: *Deleted

## 2015-12-25 ENCOUNTER — Ambulatory Visit (HOSPITAL_COMMUNITY)
Admission: RE | Admit: 2015-12-25 | Discharge: 2015-12-25 | Disposition: A | Discharge status: Home / Self Care | Payer: PPO | Source: Ambulatory Visit | Attending: Gastroenterology | Admitting: Gastroenterology

## 2015-12-25 ENCOUNTER — Encounter (HOSPITAL_COMMUNITY): Payer: Self-pay | Admitting: *Deleted

## 2015-12-25 ENCOUNTER — Ambulatory Visit (HOSPITAL_COMMUNITY): Payer: PPO | Admitting: Anesthesiology

## 2015-12-25 ENCOUNTER — Encounter (HOSPITAL_COMMUNITY)
Admission: RE | Disposition: A | Discharge status: Home / Self Care | Payer: Self-pay | Source: Ambulatory Visit | Attending: Gastroenterology

## 2015-12-25 DIAGNOSIS — Z79899 Other long term (current) drug therapy: Secondary | ICD-10-CM | POA: Diagnosis not present

## 2015-12-25 DIAGNOSIS — D123 Benign neoplasm of transverse colon: Secondary | ICD-10-CM | POA: Insufficient documentation

## 2015-12-25 DIAGNOSIS — E876 Hypokalemia: Secondary | ICD-10-CM | POA: Diagnosis not present

## 2015-12-25 DIAGNOSIS — Z6841 Body Mass Index (BMI) 40.0 and over, adult: Secondary | ICD-10-CM | POA: Insufficient documentation

## 2015-12-25 DIAGNOSIS — D62 Acute posthemorrhagic anemia: Secondary | ICD-10-CM | POA: Diagnosis not present

## 2015-12-25 DIAGNOSIS — Z96651 Presence of right artificial knee joint: Secondary | ICD-10-CM | POA: Diagnosis not present

## 2015-12-25 DIAGNOSIS — Z7982 Long term (current) use of aspirin: Secondary | ICD-10-CM | POA: Insufficient documentation

## 2015-12-25 DIAGNOSIS — R945 Abnormal results of liver function studies: Secondary | ICD-10-CM

## 2015-12-25 DIAGNOSIS — Z1212 Encounter for screening for malignant neoplasm of rectum: Secondary | ICD-10-CM | POA: Diagnosis not present

## 2015-12-25 DIAGNOSIS — R748 Abnormal levels of other serum enzymes: Secondary | ICD-10-CM | POA: Insufficient documentation

## 2015-12-25 DIAGNOSIS — R7989 Other specified abnormal findings of blood chemistry: Secondary | ICD-10-CM

## 2015-12-25 DIAGNOSIS — Z1211 Encounter for screening for malignant neoplasm of colon: Secondary | ICD-10-CM | POA: Diagnosis not present

## 2015-12-25 DIAGNOSIS — K625 Hemorrhage of anus and rectum: Secondary | ICD-10-CM | POA: Insufficient documentation

## 2015-12-25 HISTORY — DX: Other specified postprocedural states: Z98.890

## 2015-12-25 HISTORY — DX: Nausea with vomiting, unspecified: R11.2

## 2015-12-25 HISTORY — PX: COLONOSCOPY WITH PROPOFOL: SHX5780

## 2015-12-25 SURGERY — COLONOSCOPY WITH PROPOFOL
Anesthesia: Monitor Anesthesia Care

## 2015-12-25 MED ORDER — LIDOCAINE 2% (20 MG/ML) 5 ML SYRINGE
INTRAMUSCULAR | Status: AC
  Filled 2015-12-25: qty 25

## 2015-12-25 MED ORDER — MIDAZOLAM HCL 2 MG/2ML IJ SOLN
INTRAMUSCULAR | Status: DC | PRN
  Administered 2015-12-25: 1 mg via INTRAVENOUS
  Administered 2015-12-25: 0.5 mg via INTRAVENOUS

## 2015-12-25 MED ORDER — SODIUM CHLORIDE 0.9 % IV SOLN: INTRAVENOUS | Status: DC

## 2015-12-25 MED ORDER — MIDAZOLAM HCL 2 MG/2ML IJ SOLN
INTRAMUSCULAR | Status: AC
  Filled 2015-12-25: qty 2

## 2015-12-25 MED ORDER — ONDANSETRON HCL 4 MG/2ML IJ SOLN
INTRAMUSCULAR | Status: DC | PRN
  Administered 2015-12-25: 4 mg via INTRAVENOUS

## 2015-12-25 MED ORDER — PROPOFOL 10 MG/ML IV BOLUS
INTRAVENOUS | Status: AC
  Filled 2015-12-25: qty 20

## 2015-12-25 MED ORDER — LIDOCAINE 2% (20 MG/ML) 5 ML SYRINGE
INTRAMUSCULAR | Status: DC | PRN
  Administered 2015-12-25: 40 mg via INTRAVENOUS

## 2015-12-25 MED ORDER — PROPOFOL 10 MG/ML IV BOLUS
INTRAVENOUS | Status: DC | PRN
  Administered 2015-12-25 (×5): 20 mg via INTRAVENOUS

## 2015-12-25 MED ORDER — LACTATED RINGERS IV SOLN
INTRAVENOUS | Status: DC
  Administered 2015-12-25: 10:00:00 via INTRAVENOUS
  Administered 2015-12-25: 1000 mL via INTRAVENOUS

## 2015-12-25 MED ORDER — ONDANSETRON HCL 4 MG/2ML IJ SOLN
INTRAMUSCULAR | Status: AC
  Filled 2015-12-25: qty 10

## 2015-12-25 MED ORDER — PHENYLEPHRINE 40 MCG/ML (10ML) SYRINGE FOR IV PUSH (FOR BLOOD PRESSURE SUPPORT)
PREFILLED_SYRINGE | INTRAVENOUS | Status: AC
  Filled 2015-12-25: qty 20

## 2015-12-25 SURGICAL SUPPLY — 21 items

## 2015-12-25 NOTE — Anesthesia Preprocedure Evaluation (Addendum)
Anesthesia Evaluation  Patient identified by MRN, date of birth, ID band Patient awake    Reviewed: Allergy & Precautions, NPO status , Patient's Chart, lab work & pertinent test results  Airway Mallampati: II  TM Distance: >3 FB Neck ROM: Full    Dental no notable dental hx. (+) Dental Advisory Given   Pulmonary neg pulmonary ROS,    Pulmonary exam normal breath sounds clear to auscultation       Cardiovascular negative cardio ROS Normal cardiovascular exam Rhythm:Regular Rate:Normal     Neuro/Psych negative neurological ROS  negative psych ROS   GI/Hepatic negative GI ROS, Neg liver ROS,   Endo/Other  Morbid obesity  Renal/GU negative Renal ROS  negative genitourinary   Musculoskeletal negative musculoskeletal ROS (+)   Abdominal   Peds negative pediatric ROS (+)  Hematology negative hematology ROS (+)   Anesthesia Other Findings   Reproductive/Obstetrics negative OB ROS                            Anesthesia Physical Anesthesia Plan  ASA: III  Anesthesia Plan: MAC   Post-op Pain Management:    Induction: Intravenous  Airway Management Planned: Simple Face Mask  Additional Equipment:   Intra-op Plan:   Post-operative Plan:   Informed Consent: I have reviewed the patients History and Physical, chart, labs and discussed the procedure including the risks, benefits and alternatives for the proposed anesthesia with the patient or authorized representative who has indicated his/her understanding and acceptance.   Dental advisory given  Plan Discussed with: CRNA and Surgeon  Anesthesia Plan Comments:         Anesthesia Quick Evaluation

## 2015-12-25 NOTE — Transfer of Care (Signed)
Immediate Anesthesia Transfer of Care Note  Patient: Ryan Roach  Procedure(s) Performed: Procedure(s): COLONOSCOPY WITH PROPOFOL (N/A)  Patient Location: Endoscopy Unit  Anesthesia Type:MAC  Level of Consciousness: awake and alert   Airway & Oxygen Therapy: Patient Spontanous Breathing and Patient connected to face mask oxygen  Post-op Assessment: Report given to RN and Post -op Vital signs reviewed and stable  Post vital signs: Reviewed and stable  Last Vitals:  Vitals:   12/25/15 0932  Pulse: (P) 70  Resp: (P) 10  Temp: (P) 36.7 C    Last Pain:  Vitals:   12/25/15 0932  TempSrc: (P) Oral         Complications: No apparent anesthesia complications

## 2015-12-25 NOTE — Anesthesia Postprocedure Evaluation (Signed)
Anesthesia Post Note  Patient: Ryan Roach  Procedure(s) Performed: Procedure(s) (LRB): COLONOSCOPY WITH PROPOFOL (N/A)  Patient location during evaluation: PACU Anesthesia Type: MAC Level of consciousness: awake and alert Pain management: pain level controlled Vital Signs Assessment: post-procedure vital signs reviewed and stable Respiratory status: spontaneous breathing, nonlabored ventilation, respiratory function stable and patient connected to nasal cannula oxygen Cardiovascular status: stable and blood pressure returned to baseline Anesthetic complications: no    Last Vitals:  Vitals:   12/25/15 0932 12/25/15 1048  BP:  (!) 124/46  Pulse: 70 90  Resp: 10 15  Temp: 36.7 C 36.4 C    Last Pain:  Vitals:   12/25/15 1048  TempSrc: Oral                 Tyrah Broers S

## 2015-12-25 NOTE — Op Note (Signed)
[facility] Patient Name: Ryan Roach Procedure Date: 12/25/2015 MRN: [id number] Attending MD: Milus Banister , MD Date of Birth: 1945-05-04 CSN: [id number] Age: 70 Admit Type: Outpatient Procedure:                Colonoscopy Indications:              Screening for colorectal malignant neoplasm, very                            minor intermittent rectal bleeding Providers:                Milus Banister, MD, Laverta Baltimore RN, RN,                            Elspeth Cho Tech., Technician, Glenis Smoker,                            CRNA Referring MD:              Medicines:                Monitored Anesthesia Care Complications:            No immediate complications. Estimated blood loss:                            None. Estimated Blood Loss:     Estimated blood loss: none. Procedure:                Pre-Anesthesia Assessment:                           - Prior to the procedure, a History and Physical                            was performed, and patient medications and                            allergies were reviewed. The patient's tolerance of                            previous anesthesia was also reviewed. The risks                            and benefits of the procedure and the sedation                            options and risks were discussed with the patient.                            All questions were answered, and informed consent                            was obtained. Prior Anticoagulants: The patient has                            taken no previous anticoagulant or antiplatelet  agents. ASA Grade Assessment: III - A patient with                            severe systemic disease. After reviewing the risks                            and benefits, the patient was deemed in                            satisfactory condition to undergo the procedure.                           After obtaining informed consent, the colonoscope                             was passed under direct vision. Throughout the                            procedure, the patient's blood pressure, pulse, and                            oxygen saturations were monitored continuously. The                            [id number]) scope was introduced through                            the anus and advanced to the the cecum, identified                            by appendiceal orifice and ileocecal valve. The                            colonoscopy was performed without difficulty. The                            patient tolerated the procedure well. The quality                            of the bowel preparation was excellent. The                            ileocecal valve, appendiceal orifice, and rectum                            were photographed (not all photographs captured due                            to techical difficulty with image capture system) Scope In: 10:27:17 AM Scope Out: 10:39:30 AM Scope Withdrawal Time: 0 hours 9 minutes 37 seconds  Total Procedure Duration: 0 hours 12 minutes 13 seconds  Findings:      Two sessile polyps were found in the transverse colon. The polyps were 2  to 3 mm in size. These polyps were removed with a cold snare. Resection       was complete, but the polyp tissue was only partially retrieved.      The exam was otherwise without abnormality on direct and retroflexion       views. Impression:               - Two 2 to 3 mm polyps in the transverse colon,                            removed with a cold snare. Complete resection.                            Partial retrieval.                           - The examination was otherwise normal on direct                            and retroflexion views. Moderate Sedation:      N/A- Per Anesthesia Care Recommendation:           - Patient has a contact number available for                            emergencies. The signs and symptoms of potential                             delayed complications were discussed with the                            patient. Return to normal activities tomorrow.                            Written discharge instructions were provided to the                            patient.                           - Resume previous diet.                           - Continue present medications.                           You will receive a letter within 2-3 weeks with the                            pathology results and my final recommendations.                           If the polyp(s) is proven to be 'pre-cancerous' on                            pathology, you will need repeat colonoscopy in 5  years. If the polyp(s) is NOT 'precancerous' on                            pathology then you should repeat colon cancer                            screening in 10 years with colonoscopy without need                            for colon cancer screening by any method prior to                            then (including stool testing). Procedure Code(s):        --- Professional ---                           [phone], Colonoscopy, flexible; with removal of                            tumor(s), polyp(s), or other lesion(s) by snare                            technique Diagnosis Code(s):        --- Professional ---                           Z12.11, Encounter for screening for malignant                            neoplasm of colon                           D12.3, Benign neoplasm of transverse colon (hepatic                            flexure or splenic flexure) CPT copyright 2016 American Medical Association. All rights reserved. The codes documented in this report are preliminary and upon coder review may  be revised to meet current compliance requirements. Milus Banister, MD 12/25/2015 10:46:21 AM This report has been signed electronically. Number of Addenda: 0

## 2015-12-25 NOTE — Interval H&P Note (Signed)
History and Physical Interval Note:  12/25/2015 10:00 AM  Ryan Roach  has presented today for surgery, with the diagnosis of rectal beeding, screening  The various methods of treatment have been discussed with the patient and family. After consideration of risks, benefits and other options for treatment, the patient has consented to  Procedure(s): COLONOSCOPY WITH PROPOFOL (N/A) as a surgical intervention .  The patient's history has been reviewed, patient examined, no change in status, stable for surgery.  I have reviewed the patient's chart and labs.  Questions were answered to the patient's satisfaction.     Milus Banister

## 2015-12-25 NOTE — H&P (View-Only) (Signed)
Review of pertinent gastrointestinal problems: 1. Painless jaundice 05/2015: Suspected mass in pancreas on CT scan, presented with relatively painless jaundice without weight  loss; TBili max 16, CT suggested pancreatic mass, dilated intra and extrahepatic biliary tree; ERCP with Dr. 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 LFTs just prior to this procedure was hemolyzed. EUS/ERCP Dr. Ardis Hughs 2 weeks later: removed previous plastic stent (cytology no obvious malignant cells); only mild narrowing of distal CBD, stone particles removed. NO obvious masses, tumors.  Elected to NOT replace stent and observe him clinically: Serial LFTs have shown nearly complete normalization. Repeat imaging MRI/MRCP 10/2015 normal CBD, no masses/stones in bile duct or pancreas.   HPI: This is a   very pleasant 70 year old man whom I last saw the time of EUS ERCP about 6 months ago.    Chief complaint is abnormal liver tests, minor rectal bleeding  Has abdominal discomfort postprandially only with certain foods.  Overall stable weight.  No colonoscopy in >10 years  Does have minor rectal bleeding.   ROS: complete GI ROS as described in HPI.  Constitutional:  No unintentional weight loss   Past Medical History:  Diagnosis Date  . Anxiety   . Arthritis   . 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 Pancreatic mass 05/14/15    Past Surgical History:  Procedure Laterality Date  . ENDOSCOPIC RETROGRADE CHOLANGIOPANCREATOGRAPHY (ERCP) WITH PROPOFOL N/A 05/29/2015   Procedure: ENDOSCOPIC RETROGRADE CHOLANGIOPANCREATOGRAPHY (ERCP) WITH PROPOFOL;  Surgeon: Milus Banister, MD;  Location: [facility];  Service: Endoscopy;  Laterality: N/A;  . ERCP N/A 05/15/2015    Procedure: ENDOSCOPIC RETROGRADE CHOLANGIOPANCREATOGRAPHY (ERCP);  Surgeon: Doran Stabler, MD;  Location: [facility] ENDOSCOPY;  Service: Endoscopy;  Laterality: N/A;  . EUS N/A 05/29/2015   Procedure: UPPER ENDOSCOPIC ULTRASOUND (EUS) LINEAR;  Surgeon: Milus Banister, MD;  Location: [facility];  Service: Endoscopy;  Laterality: N/A;  . JOINT REPLACEMENT     RTKA  . TONSILLECTOMY    . TOTAL KNEE ARTHROPLASTY Right 05/25/2012   Procedure: RIGHT TOTAL KNEE ARTHROPLASTY;  Surgeon: Tobi Bastos, MD;  Location: [facility];  Service: Orthopedics;  Laterality: Right;    Current Outpatient Prescriptions  Medication Sig Dispense Refill  . acetaminophen (TYLENOL) 500 MG tablet Take 1,000 mg by mouth every 4 (four) hours as needed. Take every day per patient    . aspirin 325 MG tablet Take 325 mg by mouth daily.    . cholestyramine light (PREVALITE) 4 g packet Take 1 packet (4 g total) by mouth 2 (two) times daily. (Patient not taking: Reported on 12/03/2015) 20 packet 0  . pneumococcal 23 valent vaccine (PNU-IMMUNE) 25 MCG/0.5ML injection Inject 0.5 mLs into the muscle once.    . Pyridoxine HCl (VITAMIN B-6 PO) Take 1 tablet by mouth daily.    . tamsulosin (FLOMAX) 0.4 MG CAPS capsule Take 1 capsule (0.4 mg total) by mouth daily after supper. (Patient not taking: Reported on 12/03/2015) 30 capsule 0   No current facility-administered medications for this visit.     Allergies as of 12/03/2015  . (No Known Allergies)    Family History  Problem Relation Age of Onset  . Breast cancer Mother     Social History   Social History  . Marital  status: Married    Spouse name: N/A  . Number of children: N/A  . Years of education: N/A   Occupational History  . school driver    Social History Main Topics  . Smoking status: Never Smoker  . Smokeless tobacco: Current User    Types: Chew  . Alcohol use No  . Drug use: No  . Sexual activity: Not on file   Other Topics Concern  . Not on file    Social History Narrative  . No narrative on file     Physical Exam: BP 138/70   Pulse 62   Ht 5\' 11"  (1.803 m)   Wt (!) 313 lb (142 kg)   BMI 43.65 kg/m  Constitutional: generally well-appearing Psychiatric: alert and oriented x3 Abdomen: soft, nontender, nondistended, no obvious ascites, no peritoneal signs, normal bowel sounds No peripheral edema noted in lower extremities  Assessment and plan: 70 y.o. male with markedly improved liver tests, minor rectal bleeding  I think time has proven that he does not have pancreaticobiliary malignancy as was first feared when he presented with painless jaundice 6 months ago. 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 Either way for liver tests are nearly completely normal now with only very slightly elevated alkaline phosphatase and this is without bile duct stent in place for the past 6 months. Also repeat MRI last month was essentially normal. Going to follow his liver tests with a repeat set in 3 months from now and we will obtain 1 more cross-sectional imaging tests in about 6 months from now, MRI with MRCP to ensure we have not missed anything. His wife mentioned he has minor rectal bleeding at times he has not had colonoscopy in 10 years and so we will proceed with colonoscopy for minor rectal bleeding and screening at his soonest convenience.   Owens Loffler, MD [facility] 12/03/2015, 9:48 AM

## 2015-12-25 NOTE — Discharge Instructions (Signed)

## 2015-12-26 ENCOUNTER — Encounter (HOSPITAL_COMMUNITY): Payer: Self-pay | Admitting: Gastroenterology

## 2016-03-26 DIAGNOSIS — C44212 Basal cell carcinoma of skin of right ear and external auricular canal: Secondary | ICD-10-CM | POA: Diagnosis not present

## 2016-03-26 DIAGNOSIS — D485 Neoplasm of uncertain behavior of skin: Secondary | ICD-10-CM | POA: Diagnosis not present

## 2016-03-26 DIAGNOSIS — D225 Melanocytic nevi of trunk: Secondary | ICD-10-CM | POA: Diagnosis not present

## 2016-03-26 DIAGNOSIS — C4359 Malignant melanoma of other part of trunk: Secondary | ICD-10-CM | POA: Diagnosis not present

## 2016-04-01 DIAGNOSIS — C4359 Malignant melanoma of other part of trunk: Secondary | ICD-10-CM | POA: Diagnosis not present

## 2016-04-14 DIAGNOSIS — H524 Presbyopia: Secondary | ICD-10-CM | POA: Diagnosis not present

## 2016-04-14 DIAGNOSIS — H2513 Age-related nuclear cataract, bilateral: Secondary | ICD-10-CM | POA: Diagnosis not present

## 2016-04-21 DIAGNOSIS — C44212 Basal cell carcinoma of skin of right ear and external auricular canal: Secondary | ICD-10-CM | POA: Diagnosis not present

## 2016-06-02 ENCOUNTER — Telehealth: Payer: Self-pay

## 2016-06-02 DIAGNOSIS — R945 Abnormal results of liver function studies: Principal | ICD-10-CM

## 2016-06-02 DIAGNOSIS — R7989 Other specified abnormal findings of blood chemistry: Secondary | ICD-10-CM

## 2016-06-02 DIAGNOSIS — R109 Unspecified abdominal pain: Secondary | ICD-10-CM

## 2016-06-02 NOTE — Telephone Encounter (Signed)
-----   Message from Barron Alvine, RN sent at 12/03/2015 10:13 AM EDT ----- LFTs in 6 months. MRI abdomen with MRCP in 6 months.

## 2016-06-03 NOTE — Telephone Encounter (Signed)
No voice mail, no answer will call later lab has been added to EPIC.Marland KitchenMarland Kitchen

## 2016-06-09 NOTE — Telephone Encounter (Signed)
Pt phone hung up every time it was answered, will try later

## 2016-06-09 NOTE — Telephone Encounter (Signed)
You have been scheduled for an MRI at [facility] on 06/23/16. Your appointment time is 7 pm. Please arrive 30 minutes prior to your appointment time for registration purposes. Please make certain not to have anything to eat or drink 4 hours prior to your test. In addition, if you have any metal in your body, have a pacemaker or defibrillator, please be sure to let your ordering physician know. This test typically takes 45 minutes to 1 hour to complete. Should you need to reschedule, please call [phone] to do so.   Pt has been advised to come in for labs and instructed on MR/MRCP

## 2016-06-12 DIAGNOSIS — B351 Tinea unguium: Secondary | ICD-10-CM | POA: Diagnosis not present

## 2016-06-12 DIAGNOSIS — Z85820 Personal history of malignant melanoma of skin: Secondary | ICD-10-CM | POA: Diagnosis not present

## 2016-06-12 DIAGNOSIS — L570 Actinic keratosis: Secondary | ICD-10-CM | POA: Diagnosis not present

## 2016-06-15 ENCOUNTER — Telehealth: Payer: Self-pay | Admitting: Gastroenterology

## 2016-06-15 NOTE — Telephone Encounter (Signed)
The pt's wife was given the number to reschedule the appt to a later time.

## 2016-06-23 ENCOUNTER — Ambulatory Visit (HOSPITAL_COMMUNITY): Payer: PPO

## 2016-06-26 ENCOUNTER — Ambulatory Visit
Admission: RE | Admit: 2016-06-26 | Discharge: 2016-06-26 | Disposition: A | Discharge status: Home / Self Care | Payer: PPO | Source: Ambulatory Visit | Attending: Gastroenterology | Admitting: Gastroenterology

## 2016-06-26 DIAGNOSIS — R109 Unspecified abdominal pain: Secondary | ICD-10-CM

## 2016-06-26 DIAGNOSIS — R7989 Other specified abnormal findings of blood chemistry: Secondary | ICD-10-CM

## 2016-06-26 DIAGNOSIS — R945 Abnormal results of liver function studies: Principal | ICD-10-CM

## 2016-06-26 DIAGNOSIS — C801 Malignant (primary) neoplasm, unspecified: Secondary | ICD-10-CM

## 2016-06-26 DIAGNOSIS — K831 Obstruction of bile duct: Secondary | ICD-10-CM

## 2016-06-26 MED ORDER — GADOBENATE DIMEGLUMINE 529 MG/ML IV SOLN
20.0000 mL | Freq: Once | INTRAVENOUS | Status: AC | PRN
  Administered 2016-06-26: 20 mL via INTRAVENOUS

## 2016-06-30 ENCOUNTER — Other Ambulatory Visit: Payer: Self-pay

## 2016-06-30 ENCOUNTER — Telehealth: Payer: Self-pay | Admitting: Gastroenterology

## 2016-06-30 DIAGNOSIS — R7989 Other specified abnormal findings of blood chemistry: Secondary | ICD-10-CM

## 2016-06-30 DIAGNOSIS — R945 Abnormal results of liver function studies: Principal | ICD-10-CM

## 2016-06-30 DIAGNOSIS — R109 Unspecified abdominal pain: Secondary | ICD-10-CM

## 2016-06-30 NOTE — Progress Notes (Signed)
Pt labs in EPIC, he will come in the next week or two

## 2016-06-30 NOTE — Telephone Encounter (Signed)
Dr Ardis Hughs the pt called to discuss an incident that happened at [place] Saturday when he went in for his MRI.

## 2016-06-30 NOTE — Telephone Encounter (Signed)
Dr Ardis Hughs was called and the incident was discussed with Dr Ardis Hughs.  The pt was injured during the MRI and wanted Dr Ardis Hughs aware.  He will be called when the MRI is resulted.

## 2017-11-27 ENCOUNTER — Other Ambulatory Visit: Payer: Self-pay

## 2017-11-27 ENCOUNTER — Emergency Department (HOSPITAL_COMMUNITY): Payer: Medicare HMO

## 2017-11-27 ENCOUNTER — Encounter (HOSPITAL_COMMUNITY): Payer: Self-pay | Admitting: Internal Medicine

## 2017-11-27 ENCOUNTER — Inpatient Hospital Stay (HOSPITAL_COMMUNITY)
Admission: EM | Admit: 2017-11-27 | Discharge: 2017-11-30 | DRG: 418 | Disposition: A | Discharge status: Home Health | Payer: Medicare HMO | Attending: Internal Medicine | Admitting: Internal Medicine

## 2017-11-27 ENCOUNTER — Observation Stay (HOSPITAL_COMMUNITY): Payer: Medicare HMO

## 2017-11-27 DIAGNOSIS — I5032 Chronic diastolic (congestive) heart failure: Secondary | ICD-10-CM | POA: Diagnosis present

## 2017-11-27 DIAGNOSIS — E875 Hyperkalemia: Secondary | ICD-10-CM | POA: Diagnosis not present

## 2017-11-27 DIAGNOSIS — I700 Atherosclerosis of aorta: Secondary | ICD-10-CM | POA: Diagnosis present

## 2017-11-27 DIAGNOSIS — K8051 Calculus of bile duct without cholangitis or cholecystitis with obstruction: Secondary | ICD-10-CM

## 2017-11-27 DIAGNOSIS — Z6841 Body Mass Index (BMI) 40.0 and over, adult: Secondary | ICD-10-CM

## 2017-11-27 DIAGNOSIS — K8021 Calculus of gallbladder without cholecystitis with obstruction: Secondary | ICD-10-CM

## 2017-11-27 DIAGNOSIS — R1011 Right upper quadrant pain: Secondary | ICD-10-CM | POA: Diagnosis not present

## 2017-11-27 DIAGNOSIS — K8050 Calculus of bile duct without cholangitis or cholecystitis without obstruction: Secondary | ICD-10-CM

## 2017-11-27 DIAGNOSIS — R945 Abnormal results of liver function studies: Secondary | ICD-10-CM

## 2017-11-27 DIAGNOSIS — Z96651 Presence of right artificial knee joint: Secondary | ICD-10-CM | POA: Diagnosis present

## 2017-11-27 DIAGNOSIS — F418 Other specified anxiety disorders: Secondary | ICD-10-CM | POA: Diagnosis present

## 2017-11-27 DIAGNOSIS — E876 Hypokalemia: Secondary | ICD-10-CM

## 2017-11-27 DIAGNOSIS — K8067 Calculus of gallbladder and bile duct with acute and chronic cholecystitis with obstruction: Secondary | ICD-10-CM | POA: Diagnosis not present

## 2017-11-27 DIAGNOSIS — D123 Benign neoplasm of transverse colon: Secondary | ICD-10-CM | POA: Diagnosis present

## 2017-11-27 DIAGNOSIS — Z720 Tobacco use: Secondary | ICD-10-CM

## 2017-11-27 DIAGNOSIS — N400 Enlarged prostate without lower urinary tract symptoms: Secondary | ICD-10-CM | POA: Diagnosis present

## 2017-11-27 LAB — COMPREHENSIVE METABOLIC PANEL
ALK PHOS: 163 U/L — AB (ref 38–126)
ALT: 123 U/L — AB (ref 0–44)
ALT: 142 U/L — AB (ref 0–44)
ANION GAP: 9 (ref 5–15)
AST: 238 U/L — ABNORMAL HIGH (ref 15–41)
AST: 252 U/L — ABNORMAL HIGH (ref 15–41)
Albumin: 3 g/dL — ABNORMAL LOW (ref 3.5–5.0)
Albumin: 3 g/dL — ABNORMAL LOW (ref 3.5–5.0)
Alkaline Phosphatase: 167 U/L — ABNORMAL HIGH (ref 38–126)
Anion gap: 10 (ref 5–15)
BUN: 15 mg/dL (ref 8–23)
BUN: 18 mg/dL (ref 8–23)
CALCIUM: 8.3 mg/dL — AB (ref 8.9–10.3)
CALCIUM: 8.7 mg/dL — AB (ref 8.9–10.3)
CHLORIDE: 104 mmol/L (ref 98–111)
CO2: 26 mmol/L (ref 22–32)
CO2: 26 mmol/L (ref 22–32)
CREATININE: 0.73 mg/dL (ref 0.61–1.24)
CREATININE: 0.76 mg/dL (ref 0.61–1.24)
Chloride: 104 mmol/L (ref 98–111)
GFR calc non Af Amer: >60 mL/min (ref >60)
GFR calc non Af Amer: >60 mL/min (ref >60)
Glucose, Bld: 128 mg/dL — ABNORMAL HIGH (ref 70–99)
Glucose, Bld: 118 mg/dL — ABNORMAL HIGH (ref 70–99)
Potassium: 3.2 mmol/L — ABNORMAL LOW (ref 3.5–5.1)
Potassium: 3.4 mmol/L — ABNORMAL LOW (ref 3.5–5.1)
SODIUM: 139 mmol/L (ref 135–145)
SODIUM: 140 mmol/L (ref 135–145)
Total Bilirubin: 2.5 mg/dL — ABNORMAL HIGH (ref 0.3–1.2)
Total Bilirubin: 3.4 mg/dL — ABNORMAL HIGH (ref 0.3–1.2)
Total Protein: 7.8 g/dL (ref 6.5–8.1)
Total Protein: 7.3 g/dL (ref 6.5–8.1)

## 2017-11-27 LAB — URINALYSIS, ROUTINE W REFLEX MICROSCOPIC
BACTERIA UA: NONE SEEN
Glucose, UA: NEGATIVE mg/dL
Ketones, ur: NEGATIVE mg/dL
LEUKOCYTES UA: NEGATIVE
Nitrite: NEGATIVE
PROTEIN: NEGATIVE mg/dL
RBC / HPF: >50 RBC/hpf — ABNORMAL HIGH (ref 0–5)
SPECIFIC GRAVITY, URINE: 1.027 (ref 1.005–1.030)
pH: 5 (ref 5.0–8.0)

## 2017-11-27 LAB — LIPASE, BLOOD
LIPASE: 28 U/L (ref 11–51)
LIPASE: 37 U/L (ref 11–51)

## 2017-11-27 LAB — CBC
HEMATOCRIT: 46.7 % (ref 39.0–52.0)
Hemoglobin: 14.3 g/dL (ref 13.0–17.0)
MCH: 27.8 pg (ref 26.0–34.0)
MCHC: 30.6 g/dL (ref 30.0–36.0)
MCV: 90.9 fL (ref 80.0–100.0)
NRBC: 0 % (ref 0.0–0.2)
PLATELETS: 370 10*3/uL (ref 150–400)
RBC: 5.14 MIL/uL (ref 4.22–5.81)
RDW: 12.9 % (ref 11.5–15.5)
WBC: 10.2 10*3/uL (ref 4.0–10.5)

## 2017-11-27 LAB — BILIRUBIN, DIRECT: Bilirubin, Direct: 1.2 mg/dL — ABNORMAL HIGH (ref 0.0–0.2)

## 2017-11-27 LAB — PROTIME-INR
INR: 1.18
Prothrombin Time: 14.9 seconds (ref 11.4–15.2)

## 2017-11-27 LAB — MAGNESIUM: Magnesium: 1.9 mg/dL (ref 1.7–2.4)

## 2017-11-27 LAB — I-STAT CG4 LACTIC ACID, ED: LACTIC ACID, VENOUS: 1.18 mmol/L (ref 0.5–1.9)

## 2017-11-27 MED ORDER — POTASSIUM CHLORIDE IN NACL 20-0.9 MEQ/L-% IV SOLN
INTRAVENOUS | Status: AC
  Administered 2017-11-27: 08:00:00 via INTRAVENOUS
  Filled 2017-11-27 (×2): qty 1000

## 2017-11-27 MED ORDER — ONDANSETRON HCL 4 MG/2ML IJ SOLN
4.0000 mg | Freq: Once | INTRAMUSCULAR | Status: AC
  Administered 2017-11-27: 4 mg via INTRAVENOUS
  Filled 2017-11-27: qty 2

## 2017-11-27 MED ORDER — METRONIDAZOLE IN NACL 5-0.79 MG/ML-% IV SOLN
500.0000 mg | Freq: Three times a day (TID) | INTRAVENOUS | Status: DC
  Administered 2017-11-27 – 2017-11-30 (×8): 500 mg via INTRAVENOUS
  Filled 2017-11-27 (×10): qty 100

## 2017-11-27 MED ORDER — POTASSIUM CHLORIDE CRYS ER 20 MEQ PO TBCR
40.0000 meq | EXTENDED_RELEASE_TABLET | Freq: Once | ORAL | Status: AC
  Administered 2017-11-27: 40 meq via ORAL
  Filled 2017-11-27: qty 2

## 2017-11-27 MED ORDER — IOHEXOL 300 MG/ML  SOLN
100.0000 mL | Freq: Once | INTRAMUSCULAR | Status: AC | PRN
  Administered 2017-11-27: 100 mL via INTRAVENOUS

## 2017-11-27 MED ORDER — HEPARIN SODIUM (PORCINE) 5000 UNIT/ML IJ SOLN
5000.0000 [IU] | Freq: Three times a day (TID) | INTRAMUSCULAR | Status: AC
  Administered 2017-11-27 (×2): 5000 [IU] via SUBCUTANEOUS
  Filled 2017-11-27 (×2): qty 1

## 2017-11-27 MED ORDER — SODIUM CHLORIDE 0.9 % IV SOLN
1.0000 g | Freq: Once | INTRAVENOUS | Status: AC
  Administered 2017-11-27: 1 g via INTRAVENOUS
  Filled 2017-11-27: qty 10

## 2017-11-27 MED ORDER — SODIUM CHLORIDE 0.9 % IV SOLN
2.0000 g | INTRAVENOUS | Status: DC
  Administered 2017-11-28 – 2017-11-30 (×3): 2 g via INTRAVENOUS
  Filled 2017-11-27 (×3): qty 20

## 2017-11-27 MED ORDER — HYDROMORPHONE HCL 1 MG/ML IJ SOLN
1.0000 mg | INTRAMUSCULAR | Status: DC | PRN
  Administered 2017-11-27: 1 mg via INTRAVENOUS
  Filled 2017-11-27: qty 1

## 2017-11-27 MED ORDER — MORPHINE SULFATE (PF) 2 MG/ML IV SOLN: 2.0000 mg | INTRAVENOUS | Status: DC | PRN

## 2017-11-27 MED ORDER — FAMOTIDINE IN NACL 20-0.9 MG/50ML-% IV SOLN
20.0000 mg | Freq: Two times a day (BID) | INTRAVENOUS | Status: DC
  Administered 2017-11-27 – 2017-11-30 (×7): 20 mg via INTRAVENOUS
  Filled 2017-11-27 (×7): qty 50

## 2017-11-27 MED ORDER — SODIUM CHLORIDE 0.9 % IV BOLUS
1000.0000 mL | Freq: Once | INTRAVENOUS | Status: AC
  Administered 2017-11-27: 1000 mL via INTRAVENOUS

## 2017-11-27 MED ORDER — KETOROLAC TROMETHAMINE 30 MG/ML IJ SOLN
30.0000 mg | Freq: Once | INTRAMUSCULAR | Status: AC
  Administered 2017-11-28: 30 mg via INTRAVENOUS
  Filled 2017-11-27: qty 1

## 2017-11-27 MED ORDER — CITALOPRAM HYDROBROMIDE 20 MG PO TABS
20.0000 mg | ORAL_TABLET | Freq: Every day | ORAL | Status: DC
  Administered 2017-11-27: 20 mg via ORAL
  Filled 2017-11-27: qty 1

## 2017-11-27 MED ORDER — HYDROMORPHONE HCL 1 MG/ML IJ SOLN
0.5000 mg | Freq: Once | INTRAMUSCULAR | Status: AC
  Administered 2017-11-27: 0.5 mg via INTRAVENOUS
  Filled 2017-11-27: qty 1

## 2017-11-27 MED ORDER — ONDANSETRON HCL 4 MG/2ML IJ SOLN
4.0000 mg | Freq: Four times a day (QID) | INTRAMUSCULAR | Status: DC | PRN
  Administered 2017-11-27: 4 mg via INTRAVENOUS
  Filled 2017-11-27: qty 2

## 2017-11-27 NOTE — H&P (Addendum)
TRH H&P   Patient Demographics:    Ryan Roach, is a 72 y.o. male  MRN: [id number]   DOB - 1945-06-19  Admit Date - 11/27/2017  Outpatient Primary MD for the patient is Milus Banister, MD  Referring MD/NP/PA: Addison Lank  Outpatient Specialists:  [facility]  Patient coming from: home  Chief Complaint  Patient presents with  . Abdominal Pain      HPI:    Ryan Roach  is a 72 y.o. male, w h/o ERCP w sphincterotomy for cholelithiasis in 2017 apparently presents with c/o nausea over the past 1 week. Pt noted yesterday RUQ / epigastric pain, sharp.  Pt denies fever, chills, cp,palp, sob, diarrhea, brbpr, black stool. Pt presented to ER due to abdominal pain.   In Ed,  T 99.3  P 96  Bp 105/68 pox 99% on RA  Urinalysis rbc >50 Na 139, K 3.2,  Bun 18, Creatinine 0.76  Ast 238, Alt 123, Alk phos 167 T. Bili 2.5 Wbc 10.2, hgb 14.3, Plt 370  CT scan abd/ pelvis IMPRESSION: 1. Recurrent intra and extrahepatic biliary ductal dilatation, common bile duct measures 11 mm. Mild gallbladder wall thickening with pericholecystic edema suggests acute gallbladder inflammation, no abnormal gallbladder distention. MRCP or ERCP may be of value. 2. No other acute finding in the abdomen or pelvis. 3. Incidental findings of prostatomegaly and Aortic Atherosclerosis (ICD10-I70.0).  Pt will be admitted for abdominal pain r/o choledocholithiasis.           Review of systems:    In addition to the HPI above,   No Fever-chills, No Headache, No changes with Vision or hearing, No problems swallowing food or Liquids, No Chest pain, Cough or Shortness of Breath, No Vommitting, Bowel movements are regular, No Blood in stool or Urine, No dysuria, No new skin rashes or bruises, No new joints pains-aches,  No new weakness, tingling, numbness in any extremity, No recent weight  gain or loss, No polyuria, polydypsia or polyphagia, No significant Mental Stressors.  A full 10 point Review of Systems was done, except as stated above, all other Review of Systems were negative.   With Past History of the following :    Past Medical History:  Diagnosis Date  . Anxiety   . Arthritis   . 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 Pancreatic mass 05/14/15  . PONV (postoperative nausea and vomiting)       Past Surgical History:  Procedure Laterality Date  . COLONOSCOPY WITH PROPOFOL N/A 12/25/2015   Procedure: COLONOSCOPY WITH PROPOFOL;  Surgeon: Milus Banister, MD;  Location: [facility];  Service: Endoscopy;  Laterality: N/A;  . ENDOSCOPIC RETROGRADE CHOLANGIOPANCREATOGRAPHY (ERCP) WITH PROPOFOL N/A 05/29/2015   Procedure: ENDOSCOPIC RETROGRADE CHOLANGIOPANCREATOGRAPHY (ERCP) WITH PROPOFOL;  Surgeon: Milus Banister, MD;  Location: [facility];  Service: Endoscopy;  Laterality: N/A;  . ERCP N/A 05/15/2015   Procedure: ENDOSCOPIC RETROGRADE CHOLANGIOPANCREATOGRAPHY (ERCP);  Surgeon: Doran Stabler, MD;  Location: [facility] ENDOSCOPY;  Service: Endoscopy;  Laterality: N/A;  . EUS N/A 05/29/2015   Procedure: UPPER ENDOSCOPIC ULTRASOUND (EUS) LINEAR;  Surgeon: Milus Banister, MD;  Location: [facility]OSCOPY;  Service: Endoscopy;  Laterality: N/A;  . JOINT REPLACEMENT     RTKA  . TONSILLECTOMY    . TOTAL KNEE ARTHROPLASTY Right 05/25/2012   Procedure: RIGHT TOTAL KNEE ARTHROPLASTY;  Surgeon: Tobi Bastos, MD;  Location: [facility];  Service: Orthopedics;  Laterality: Right;      Social History:     Social History   Tobacco Use  . Smoking status: Never Smoker  . Smokeless tobacco: Current User    Types: Chew  Substance Use Topics  . Alcohol use: No     Lives - at home  Mobility - walks by self   Family History :     Family History  Problem Relation Age of Onset  . Breast  cancer Mother        Home Medications:   Prior to Admission medications   Medication Sig Start Date End Date Taking? Authorizing Provider  acetaminophen (TYLENOL) 500 MG tablet Take 500-1,000 mg by mouth every 4 (four) hours as needed for headache.    Yes [provider]  citalopram (CELEXA) 20 MG tablet Take 20 mg by mouth daily. 11/05/17  Yes [provider]     Allergies:    No Known Allergies   Physical Exam:   Vitals  Blood pressure 105/68, pulse 96, temperature 99.3 F (37.4 C), temperature source Rectal, resp. rate 16, height 5' 11"  (1.803 m), weight (!) 145.2 kg, SpO2 99 %.   1. General  lying in bed in NAD,   2. Normal affect and insight, Not Suicidal or Homicidal, Awake Alert, Oriented X 3.  3. No F.N deficits, ALL C.Nerves Intact, Strength 5/5 all 4 extremities, Sensation intact all 4 extremities, Plantars down going.    4. Ears and Eyes appear Normal, Conjunctivae clear, PERRLA. Moist Oral Mucosa.  5. Supple Neck, No JVD, No cervical lymphadenopathy appriciated, No Carotid Bruits.  6. Symmetrical Chest wall movement, Good air movement bilaterally, CTAB.  7. RRR, No Gallops, Rubs or Murmurs, No Parasternal Heave.  8. Positive Bowel Sounds, Abdomen Soft, No tenderness, No organomegaly appriciated,No rebound -guarding or rigidity.  9.  No Cyanosis, Normal Skin Turgor, No Skin Rash or Bruise.  10. Good muscle tone,  joints appear normal , no effusions, Normal ROM.  11. No Palpable Lymph Nodes in Neck or Axillae   Morbidly obese    Data Review:    CBC Recent Labs  Lab 11/27/17 0058  WBC 10.2  HGB 14.3  HCT 46.7  PLT 370  MCV 90.9  MCH 27.8  MCHC 30.6  RDW 12.9   ------------------------------------------------------------------------------------------------------------------  Chemistries  Recent Labs  Lab 11/27/17 0058  NA 139  K 3.2*  CL 104  CO2 26  GLUCOSE 128*  BUN 18  CREATININE 0.76  CALCIUM 8.7*  AST 238*    ALT 123*  ALKPHOS 167*  BILITOT 2.5*   ------------------------------------------------------------------------------------------------------------------ estimated creatinine clearance is 123.7 mL/min (by C-G formula based on SCr of 0.76 mg/dL). ------------------------------------------------------------------------------------------------------------------ No results for input(s): TSH, T4TOTAL, T3FREE, THYROIDAB in the last 72 hours.  Invalid input(s): FREET3  Coagulation profile No results for input(s): INR, PROTIME in the last 168 hours. ------------------------------------------------------------------------------------------------------------------- No results  for input(s): DDIMER in the last 72 hours. -------------------------------------------------------------------------------------------------------------------  Cardiac Enzymes No results for input(s): CKMB, TROPONINI, MYOGLOBIN in the last 168 hours.  Invalid input(s): CK ------------------------------------------------------------------------------------------------------------------ No results found for: BNP   ---------------------------------------------------------------------------------------------------------------  Urinalysis    Component Value Date/Time   COLORURINE AMBER (A) 11/27/2017 0055   APPEARANCEUR CLEAR 11/27/2017 0055   LABSPEC 1.027 11/27/2017 0055   PHURINE 5.0 11/27/2017 0055   GLUCOSEU NEGATIVE 11/27/2017 0055   HGBUR LARGE (A) 11/27/2017 0055   BILIRUBINUR SMALL (A) 11/27/2017 0055   KETONESUR NEGATIVE 11/27/2017 0055   PROTEINUR NEGATIVE 11/27/2017 0055   UROBILINOGEN 1.0 05/19/2012 1003   NITRITE NEGATIVE 11/27/2017 0055   LEUKOCYTESUR NEGATIVE 11/27/2017 0055    ----------------------------------------------------------------------------------------------------------------   Imaging Results:    Ct Abdomen Pelvis W Contrast  Result Date: 11/27/2017 CLINICAL DATA:  Abdominal  pain. Left lower and left upper quadrant pain, onset yesterday. Nausea. EXAM: CT ABDOMEN AND PELVIS WITH CONTRAST TECHNIQUE: Multidetector CT imaging of the abdomen and pelvis was performed using the standard protocol following bolus administration of intravenous contrast. CONTRAST:  142m OMNIPAQUE IOHEXOL 300 MG/ML  SOLN COMPARISON:  Right upper quadrant ultrasound earlier this day. MRCP 06/26/2016. Abdominal CT 05/14/2015 FINDINGS: Lower chest: No consolidation or pleural fluid. Hepatobiliary: Recurrent intra and extrahepatic biliary ductal dilatation with common bile duct measuring 11 mm. Gallbladder physiologically distended with wall thickening and mild pericholecystic stranding. No calcified choledocholithiasis. No focal hepatic lesion. Pancreas: No ductal dilatation or inflammation. The previous questioned hypodense mass on CT in the pancreatic head is not seen. Spleen: Normal in size without focal abnormality. Adrenals/Urinary Tract: Normal adrenal glands. No hydronephrosis or perinephric edema. Homogeneous renal enhancement with symmetric excretion on delayed phase imaging. Urinary bladder is physiologically distended without wall thickening. Stomach/Bowel: Stomach is within normal limits. Appendix appears normal. No evidence of bowel wall thickening, distention, or inflammatory changes. Vascular/Lymphatic: Small left central mesenteric nodes with adjacent mesenteric edema, unchanged from prior exam. Aortic atherosclerosis without aneurysm. An 11 mm portal caval node is unchanged from prior and likely reactive. Reproductive: Enlarged prostate gland spanning 6.8 cm transverse. Other: No free air or ascites. Fat containing umbilical hernia with diastasis of the anterior abdominal wall musculature. Musculoskeletal: Degenerative change in both hips and throughout the lumbar spine. There are no acute or suspicious osseous abnormalities. IMPRESSION: 1. Recurrent intra and extrahepatic biliary ductal dilatation,  common bile duct measures 11 mm. Mild gallbladder wall thickening with pericholecystic edema suggests acute gallbladder inflammation, no abnormal gallbladder distention. MRCP or ERCP may be of value. 2. No other acute finding in the abdomen or pelvis. 3. Incidental findings of prostatomegaly and Aortic Atherosclerosis (ICD10-I70.0). Electronically Signed   By: MKeith RakeM.D.   On: 11/27/2017 04:51   [place]domen Limited Ruq  Result Date: 11/27/2017 CLINICAL DATA:  Right upper quadrant pain. EXAM: ULTRASOUND ABDOMEN LIMITED RIGHT UPPER QUADRANT COMPARISON:  MRCP 06/26/2016 FINDINGS: Technically limited exam due to body habitus. Gallbladder: Physiologically distended. Small stones and sludge in the gallbladder. Mild gallbladder wall thickening of 4 mm, wall thickness is not well assessed. No definite pericholecystic fluid. No sonographic Murphy sign noted by sonographer. Common bile duct: Diameter: 11 mm proximally. Liver: Diffusely increased and heterogeneous in echogenicity. Liver parenchyma is difficult to penetrate. Portal vein is patent on color Doppler imaging with normal direction of blood flow towards the liver. IMPRESSION: 1. Stones and sludge in the gallbladder with mild gallbladder wall thickening. No sonographic Murphy sign. 2. Mild biliary dilatation with increased common bile  duct from prior MRCP, currently 11 mm, previously 6 mm. Recurrent choledocholithiasis is considered. Consider repeat MRCP if patient is able tolerate breath hold technique versus nuclear medicine HIDA scan. Electronically Signed   By: Keith Rake M.D.   On: 11/27/2017 04:19       Assessment & Plan:    Principal Problem:   Abnormal liver function Active Problems:   Hypokalemia   Abdominal pain ? Choledocholithiasis vs early cholecystitis NPO Ns iv Dilaudid 31m iv q4h prn severe pain GI consult requested  Abnormal liver function Check acute hepatitis panel MRCP ordered  Hypokalemia Replete Check  cmp in am  Anxiety Cont celexa   DVT Prophylaxis  - SCDs   AM Labs Ordered, also please review Full Orders  Family Communication: Admission, patients condition and plan of care including tests being ordered have been discussed with the patient who indicate understanding and agree with the plan and Code Status.  Code Status  FULL CODE  Likely DC to  home  Condition GUARDED   Consults called:  GI   Admission status: observation, pt will require hospitalization for MRCP and depending upon results and how rapidly his symptoms improve may possibly require inpatient status  Time spent in minutes : 70   JJani GravelM.D on 11/27/2017 at 6:23 AM  Between 7am to 7pm - Pager - [phone]  After 7pm go to [email or web address] - password [email or web address] - Office  [phone]

## 2017-11-27 NOTE — ED Provider Notes (Signed)
[facility] EMERGENCY DEPARTMENT Provider Note  CSN: [id number] Arrival date & time: 11/27/17 2505  Chief Complaint(s) Abdominal Pain  HPI Ryan Roach is a 72 y.o. male    Abdominal Pain   This is a new problem. The current episode started 2 days ago. The problem occurs constantly. The problem has been gradually worsening. The pain is associated with an unknown factor. The pain is located in the RLQ, RUQ and epigastric region. The quality of the pain is aching. The pain is moderate. Associated symptoms include anorexia and nausea. Pertinent negatives include diarrhea, vomiting, constipation, dysuria and frequency. The symptoms are aggravated by eating. Nothing relieves the symptoms. His past medical history is significant for gallstones.    Past Medical History Past Medical History:  Diagnosis Date  . Anxiety   . Arthritis   . 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 Pancreatic mass 05/14/15  . PONV (postoperative nausea and vomiting)    Patient Active Problem List   Diagnosis Date Noted  . Rectal bleeding   . Colon cancer screening   . Benign neoplasm of transverse colon   . BPH (benign prostatic hyperplasia) 05/19/2015  . Transaminitis 05/19/2015  . Biliary stricture   . Calculus of bile duct with obstruction and without cholangitis or cholecystitis   . Jaundice   . Hypokalemia 05/14/2015  . Enlarged prostate 05/14/2015  . Non-healing wound or right  ear 05/14/2015  . Physical deconditioning 05/14/2015  . Hyperbilirubinemia 05/14/2015  . 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 Obstructive jaundice   . Pancreatic mass   . Acute blood loss anemia 05/27/2012  . Morbid obesity ([place]) 05/25/2012  . Osteoarthritis of right knee 05/25/2012   Home Medication(s) Prior to Admission medications   Medication Sig Start Date End Date Taking? Authorizing Provider   acetaminophen (TYLENOL) 500 MG tablet Take 500-1,000 mg by mouth every 4 (four) hours as needed for headache.     [provider]  pneumococcal 23 valent vaccine (PNU-IMMUNE) 25 MCG/0.5ML injection Inject 0.5 mLs into the muscle once.    [provider]                                                                                                                                    Past Surgical History Past Surgical History:  Procedure Laterality Date  . COLONOSCOPY WITH PROPOFOL N/A 12/25/2015   Procedure: COLONOSCOPY WITH PROPOFOL;  Surgeon: Milus Banister, MD;  Location: [facility] ENDOSCOPY;  Service: Endoscopy;  Laterality: N/A;  . ENDOSCOPIC RETROGRADE CHOLANGIOPANCREATOGRAPHY (ERCP) WITH PROPOFOL N/A 05/29/2015   Procedure: ENDOSCOPIC RETROGRADE CHOLANGIOPANCREATOGRAPHY (ERCP) WITH PROPOFOL;  Surgeon: Milus Banister, MD;  Location: [facility] ENDOSCOPY;  Service: Endoscopy;  Laterality: N/A;  . ERCP N/A 05/15/2015   Procedure: ENDOSCOPIC RETROGRADE CHOLANGIOPANCREATOGRAPHY (ERCP);  Surgeon: Doran Stabler, MD;  Location: [facility] ENDOSCOPY;  Service: Endoscopy;  Laterality: N/A;  . EUS N/A 05/29/2015   Procedure: UPPER ENDOSCOPIC ULTRASOUND (EUS) LINEAR;  Surgeon: Milus Banister, MD;  Location: [facility] ENDOSCOPY;  Service: Endoscopy;  Laterality: N/A;  . JOINT REPLACEMENT     RTKA  . TONSILLECTOMY    . TOTAL KNEE ARTHROPLASTY Right 05/25/2012   Procedure: RIGHT TOTAL KNEE ARTHROPLASTY;  Surgeon: Tobi Bastos, MD;  Location: [facility] ORS;  Service: Orthopedics;  Laterality: Right;   Family History Family History  Problem Relation Age of Onset  . Breast cancer Mother     Social History Social History   Tobacco Use  . Smoking status: Never Smoker  . Smokeless tobacco: Current User    Types: Chew  Substance Use Topics  . Alcohol use: No  . Drug use: No   Allergies Patient has no known allergies.  Review of Systems Review of Systems  Gastrointestinal: Positive for abdominal pain,  anorexia and nausea. Negative for constipation, diarrhea and vomiting.  Genitourinary: Negative for dysuria and frequency.   All other systems are reviewed and are negative for acute change except as noted in the HPI  Physical Exam Vital Signs  I have reviewed the triage vital signs BP (!) 143/118 (BP Location: Right Arm)   Pulse (!) 101   Temp 99.4 F (37.4 C) (Oral)   Resp 18   Ht 5\' 11"  (1.803 m)   Wt (!) 145.2 kg   SpO2 95%   BMI 44.63 kg/m   Physical Exam  Constitutional: He is oriented to person, place, and time. He appears well-developed and well-nourished. No distress.  HENT:  Head: Normocephalic and atraumatic.  Right Ear: External ear normal.  Left Ear: External ear normal.  Nose: Nose normal.  Mouth/Throat: Mucous membranes are normal. No trismus in the jaw.  Eyes: Conjunctivae and EOM are normal. No scleral icterus.  Neck: Normal range of motion and phonation normal.  Cardiovascular: Normal rate and regular rhythm.  Pulmonary/Chest: Effort normal. No stridor. No respiratory distress.  Abdominal: He exhibits no distension. There is tenderness in the right upper quadrant, right lower quadrant and epigastric area. There is no rigidity, no rebound, no guarding and negative Murphy's sign.  Musculoskeletal: Normal range of motion. He exhibits no edema.  Neurological: He is alert and oriented to person, place, and time.  Skin: He is not diaphoretic.  Psychiatric: He has a normal mood and affect. His behavior is normal.  Vitals reviewed.   ED Results and Treatments Labs (all labs ordered are listed, but only abnormal results are displayed) Labs Reviewed  COMPREHENSIVE METABOLIC PANEL - Abnormal; Notable for the following components:      Result Value   Potassium 3.2 (*)    Glucose, Bld 128 (*)    Calcium 8.7 (*)    Albumin 3.0 (*)    AST 238 (*)    ALT 123 (*)    Alkaline Phosphatase 167 (*)    Total Bilirubin 2.5 (*)    All other components within normal  limits  URINALYSIS, ROUTINE W REFLEX MICROSCOPIC - Abnormal; Notable for the following components:   Color, Urine AMBER (*)    Hgb urine dipstick LARGE (*)    Bilirubin Urine SMALL (*)    RBC / HPF >50 (*)    All other components within normal limits  BILIRUBIN, DIRECT - Abnormal; Notable for the following components:   Bilirubin, Direct 1.2 (*)    All other components within normal limits  LIPASE, BLOOD  CBC  I-STAT CG4 LACTIC ACID, ED  I-STAT CG4 LACTIC ACID, ED                                                                                                                         EKG  EKG Interpretation  Date/Time:    Ventricular Rate:    PR Interval:    QRS Duration:   QT Interval:    QTC Calculation:   R Axis:     Text Interpretation:        Radiology Ct Abdomen Pelvis W Contrast  Result Date: 11/27/2017 CLINICAL DATA:  Abdominal pain. Left lower and left upper quadrant pain, onset yesterday. Nausea. EXAM: CT ABDOMEN AND PELVIS WITH CONTRAST TECHNIQUE: Multidetector CT imaging of the abdomen and pelvis was performed using the standard protocol following bolus administration of intravenous contrast. CONTRAST:  131mL OMNIPAQUE IOHEXOL 300 MG/ML  SOLN COMPARISON:  Right upper quadrant ultrasound earlier this day. MRCP 06/26/2016. Abdominal CT 05/14/2015 FINDINGS: Lower chest: No consolidation or pleural fluid. Hepatobiliary: Recurrent intra and extrahepatic biliary ductal dilatation with common bile duct measuring 11 mm. Gallbladder physiologically distended with wall thickening and mild pericholecystic stranding. No calcified choledocholithiasis. No focal hepatic lesion. Pancreas: No ductal dilatation or inflammation. The previous questioned hypodense mass on CT in the pancreatic head is not seen. Spleen: Normal in size without focal abnormality. Adrenals/Urinary Tract: Normal adrenal glands. No hydronephrosis or perinephric edema. Homogeneous renal enhancement with symmetric  excretion on delayed phase imaging. Urinary bladder is physiologically distended without wall thickening. Stomach/Bowel: Stomach is within normal limits. Appendix appears normal. No evidence of bowel wall thickening, distention, or inflammatory changes. Vascular/Lymphatic: Small left central mesenteric nodes with adjacent mesenteric edema, unchanged from prior exam. Aortic atherosclerosis without aneurysm. An 11 mm portal caval node is unchanged from prior and likely reactive. Reproductive: Enlarged prostate gland spanning 6.8 cm transverse. Other: No free air or ascites. Fat containing umbilical hernia with diastasis of the anterior abdominal wall musculature. Musculoskeletal: Degenerative change in both hips and throughout the lumbar spine. There are no acute or suspicious osseous abnormalities. IMPRESSION: 1. Recurrent intra and extrahepatic biliary ductal dilatation, common bile duct measures 11 mm. Mild gallbladder wall thickening with pericholecystic edema suggests acute gallbladder inflammation, no abnormal gallbladder distention. MRCP or ERCP may be of value. 2. No other acute finding in the abdomen or pelvis. 3. Incidental findings of prostatomegaly and Aortic Atherosclerosis (ICD10-I70.0). Electronically Signed   By: Keith Rake M.D.   On: 11/27/2017 04:51   US Abdomen Limited Ruq  Result Date: 11/27/2017 CLINICAL DATA:  Right upper quadrant pain. EXAM: ULTRASOUND ABDOMEN LIMITED RIGHT UPPER QUADRANT COMPARISON:  MRCP 06/26/2016 FINDINGS: Technically limited exam due to body habitus. Gallbladder: Physiologically distended. Small stones and sludge in the gallbladder. Mild gallbladder wall thickening of 4 mm, wall thickness is not well assessed. No definite pericholecystic fluid. No sonographic Murphy sign noted by sonographer. Common bile duct: Diameter: 11 mm proximally. Liver: Diffusely increased and heterogeneous in echogenicity. Liver  parenchyma is difficult to penetrate. Portal vein is patent  on color Doppler imaging with normal direction of blood flow towards the liver. IMPRESSION: 1. Stones and sludge in the gallbladder with mild gallbladder wall thickening. No sonographic Murphy sign. 2. Mild biliary dilatation with increased common bile duct from prior MRCP, currently 11 mm, previously 6 mm. Recurrent choledocholithiasis is considered. Consider repeat MRCP if patient is able tolerate breath hold technique versus nuclear medicine HIDA scan. Electronically Signed   By: Keith Rake M.D.   On: 11/27/2017 04:19   Pertinent labs & imaging results that were available during my care of the patient were reviewed by me and considered in my medical decision making (see chart for details).  Medications Ordered in ED Medications  cefTRIAXone (ROCEPHIN) 1 g in sodium chloride 0.9 % 100 mL IVPB (has no administration in time range)  HYDROmorphone (DILAUDID) injection 0.5 mg (0.5 mg Intravenous Given 11/27/17 0332)  ondansetron (ZOFRAN) injection 4 mg (4 mg Intravenous Given 11/27/17 0332)  sodium chloride 0.9 % bolus 1,000 mL (1,000 mLs Intravenous New Bag/Given 11/27/17 0331)  iohexol (OMNIPAQUE) 300 MG/ML solution 100 mL (100 mLs Intravenous Contrast Given 11/27/17 0404)                                                                                                                                    Procedures Procedures  (including critical care time)  Medical Decision Making / ED Course I have reviewed the nursing notes for this encounter and the patient's prior records (if available in EHR or on provided paperwork).    Right-sided abdominal pain with tenderness to palpation.  Labs without leukocytosis or anemia.  Metabolic panel with mild hypokalemia, otherwise no significant electrolyte derangements or renal insufficiency.  LFTs elevated concerning for biliary obstruction.  No evidence of pancreatitis.  On review of records, patient noted to have a previous admission for  choledocholithiasis.  Right upper quadrant ultrasound notable for evidence of likely choledocholithiasis.  Mild gallbladder wall thickening without overt evidence of cholecystitis.  Patient given nausea medicine, pain medicine and IV fluids.  Empiric antibiotics given.  Will admit to medicine for continued work-up and management with likely MRCP and GI consultation in the morning.  Final Clinical Impression(s) / ED Diagnoses Final diagnoses:  RUQ pain  Gallstones with biliary obstruction      This chart was dictated using voice recognition software.  Despite best efforts to proofread,  errors can occur which can change the documentation meaning.   Fatima Blank, MD 11/27/17 [phone]

## 2017-11-27 NOTE — Progress Notes (Signed)
PROGRESS NOTE                                                                                                                                                                                                             Patient Demographics:    Ryan Roach, is a 72 y.o. male, DOB - 1945-12-29, [place] date - 11/27/2017   Admitting Physician Jani [place]vel, MD  Outpatient Primary MD for the patient is Milus Banister, MD  LOS - 0  Chief Complaint  Patient presents with  . Abdominal Pain       Brief Narrative  Ryan Roach  is a 72 y.o. male, w h/o ERCP w sphincterotomy for cholelithiasis in 2017 apparently presents with c/o nausea over the past 1 week. Pt noted yesterday RUQ / epigastric pain, sharp.  Was admitted to the hospital for abdominal pain work-up suggestive of possible dilation and CBD along with possible acute cholecystitis.    Subjective:    Ryan Roach today has, No headache, No chest pain, much improved abdominal pain - No Nausea, No new weakness tingling or numbness, No Cough - SOB.     Assessment  & Plan :     1.  Right upper quadrant abdominal pain.  It started in the right upper quadrant and somehow transitioned down to right lower quadrant, imaging consistent with dilated CBD and possible cholecystitis.  Currently pain is much improved.  Continue bowel rest, continue hydration with IV fluids, continue Rocephin along with Flagyl IV.  GI and general surgery both have been consulted.  He might require MRCP/ERCP followed by cholecystectomy.  Will obtain baseline INR, EKG and two-view chest x-ray.  2.  Hypokalemia.  Replaced.  3.  Morbid obesity.  Follow with PCP for weight loss.  4.  Diastolic CHF last EF around 50% on echocardiogram in 2017.  Compensated continue to monitor.  5.  Anxiety and depression.  On Celexa, resume once GI issues are better.     Family Communication  :  wife  Code Status :  Full  Disposition Plan  :   Med  Consults  :  GI, CCS  Procedures  :    CT - 1. Recurrent intra and extrahepatic biliary ductal dilatation, common bile duct measures 11 mm. Mild gallbladder wall thickening with pericholecystic edema suggests acute gallbladder inflammation, no abnormal gallbladder distention. MRCP or ERCP may be of value. 2. No other acute finding in the abdomen or pelvis. 3. Incidental findings of  prostatomegaly and Aortic Atherosclerosis (ICD10-I70.0)  DVT Prophylaxis  :  Heparin   Lab Results  Component Value Date   PLT 370 11/27/2017    Diet :  Diet Order            Diet NPO time specified Except for: Sips with Meds  Diet effective now               Inpatient Medications Scheduled Meds: . heparin injection (subcutaneous)  5,000 Units Subcutaneous Q8H  . potassium chloride  40 mEq Oral Once   Continuous Infusions: . 0.9 % NaCl with KCl 20 mEq / L 100 mL/hr at 11/27/17 0816  . famotidine (PEPCID) IV 20 mg (11/27/17 7564)  . metronidazole     PRN Meds:.morphine injection, ondansetron (ZOFRAN) IV  Antibiotics  :   Anti-infectives (From admission, onward)   Start     Dose/Rate Route Frequency Ordered Stop   11/27/17 1100  metroNIDAZOLE (FLAGYL) IVPB 500 mg     500 mg 100 mL/hr over 60 Minutes Intravenous Every 8 hours 11/27/17 1047     11/27/17 0515  cefTRIAXone (ROCEPHIN) 1 g in sodium chloride 0.9 % 100 mL IVPB     1 g 200 mL/hr over 30 Minutes Intravenous  Once 11/27/17 0502 11/27/17 0554        Objective:   Vitals:   11/27/17 0515 11/27/17 0530 11/27/17 0545 11/27/17 0658  BP: (!) 104/48 103/60 105/68 127/84  Pulse: 99 (!) 104 96 (!) 109  Resp:   16   Temp:    98.8 F (37.1 C)  TempSrc:      SpO2: 97% 92% 99% 97%  Weight:      Height:        Wt Readings from Last 3 Encounters:  11/27/17 (!) 145.2 kg  12/25/15 (!) 142 kg  12/03/15 (!) 142 kg     Intake/Output Summary (Last 24 hours) at 11/27/2017 1048 Last data filed at 11/27/2017 3329 Gross per 24 hour   Intake 1099.91 ml  Output 200 ml  Net 899.91 ml     Physical Exam  Awake Alert, Oriented X 3, No new F.N deficits, Normal affect [place].AT,PERRAL Supple Neck,No JVD, No cervical lymphadenopathy appriciated.  Symmetrical Chest wall movement, Good air movement bilaterally, CTAB RRR,No Gallops,Rubs or new Murmurs, No Parasternal Heave +ve B.Sounds, Abd Soft, No tenderness, No organomegaly appriciated, No rebound - guarding or rigidity. No Cyanosis, Clubbing or edema, No new Rash or bruise     Data Review:    CBC Recent Labs  Lab 11/27/17 0058  WBC 10.2  HGB 14.3  HCT 46.7  PLT 370  MCV 90.9  MCH 27.8  MCHC 30.6  RDW 12.9    Chemistries  Recent Labs  Lab 11/27/17 0058  NA 139  K 3.2*  CL 104  CO2 26  GLUCOSE 128*  BUN 18  CREATININE 0.76  CALCIUM 8.7*  AST 238*  ALT 123*  ALKPHOS 167*  BILITOT 2.5*   ------------------------------------------------------------------------------------------------------------------ No results for input(s): CHOL, HDL, LDLCALC, TRIG, CHOLHDL, LDLDIRECT in the last 72 hours.  No results found for: HGBA1C ------------------------------------------------------------------------------------------------------------------ No results for input(s): TSH, T4TOTAL, T3FREE, THYROIDAB in the last 72 hours.  Invalid input(s): FREET3 ------------------------------------------------------------------------------------------------------------------ No results for input(s): VITAMINB12, FOLATE, FERRITIN, TIBC, IRON, RETICCTPCT in the last 72 hours.  Coagulation profile No results for input(s): INR, PROTIME in the last 168 hours.  No results for input(s): DDIMER in the last 72 hours.  Cardiac Enzymes No results for input(s):  CKMB, TROPONINI, MYOGLOBIN in the last 168 hours.  Invalid input(s): CK ------------------------------------------------------------------------------------------------------------------ No results found for:  BNP  Micro Results No results found for this or any previous visit (from the past 240 hour(s)).  Radiology Reports Ct Abdomen Pelvis W Contrast  Result Date: 11/27/2017 CLINICAL DATA:  Abdominal pain. Left lower and left upper quadrant pain, onset yesterday. Nausea. EXAM: CT ABDOMEN AND PELVIS WITH CONTRAST TECHNIQUE: Multidetector CT imaging of the abdomen and pelvis was performed using the standard protocol following bolus administration of intravenous contrast. CONTRAST:  136mL OMNIPAQUE IOHEXOL 300 MG/ML  SOLN COMPARISON:  Right upper quadrant ultrasound earlier this day. MRCP 06/26/2016. Abdominal CT 05/14/2015 FINDINGS: Lower chest: No consolidation or pleural fluid. Hepatobiliary: Recurrent intra and extrahepatic biliary ductal dilatation with common bile duct measuring 11 mm. Gallbladder physiologically distended with wall thickening and mild pericholecystic stranding. No calcified choledocholithiasis. No focal hepatic lesion. Pancreas: No ductal dilatation or inflammation. The previous questioned hypodense mass on CT in the pancreatic head is not seen. Spleen: Normal in size without focal abnormality. Adrenals/Urinary Tract: Normal adrenal glands. No hydronephrosis or perinephric edema. Homogeneous renal enhancement with symmetric excretion on delayed phase imaging. Urinary bladder is physiologically distended without wall thickening. Stomach/Bowel: Stomach is within normal limits. Appendix appears normal. No evidence of bowel wall thickening, distention, or inflammatory changes. Vascular/Lymphatic: Small left central mesenteric nodes with adjacent mesenteric edema, unchanged from prior exam. Aortic atherosclerosis without aneurysm. An 11 mm portal caval node is unchanged from prior and likely reactive. Reproductive: Enlarged prostate gland spanning 6.8 cm transverse. Other: No free air or ascites. Fat containing umbilical hernia with diastasis of the anterior abdominal wall musculature.  Musculoskeletal: Degenerative change in both hips and throughout the lumbar spine. There are no acute or suspicious osseous abnormalities. IMPRESSION: 1. Recurrent intra and extrahepatic biliary ductal dilatation, common bile duct measures 11 mm. Mild gallbladder wall thickening with pericholecystic edema suggests acute gallbladder inflammation, no abnormal gallbladder distention. MRCP or ERCP may be of value. 2. No other acute finding in the abdomen or pelvis. 3. Incidental findings of prostatomegaly and Aortic Atherosclerosis (ICD10-I70.0). Electronically Signed   By: Keith Rake M.D.   On: 11/27/2017 04:51   US Abdomen Limited Ruq  Result Date: 11/27/2017 CLINICAL DATA:  Right upper quadrant pain. EXAM: ULTRASOUND ABDOMEN LIMITED RIGHT UPPER QUADRANT COMPARISON:  MRCP 06/26/2016 FINDINGS: Technically limited exam due to body habitus. Gallbladder: Physiologically distended. Small stones and sludge in the gallbladder. Mild gallbladder wall thickening of 4 mm, wall thickness is not well assessed. No definite pericholecystic fluid. No sonographic Murphy sign noted by sonographer. Common bile duct: Diameter: 11 mm proximally. Liver: Diffusely increased and heterogeneous in echogenicity. Liver parenchyma is difficult to penetrate. Portal vein is patent on color Doppler imaging with normal direction of blood flow towards the liver. IMPRESSION: 1. Stones and sludge in the gallbladder with mild gallbladder wall thickening. No sonographic Murphy sign. 2. Mild biliary dilatation with increased common bile duct from prior MRCP, currently 11 mm, previously 6 mm. Recurrent choledocholithiasis is considered. Consider repeat MRCP if patient is able tolerate breath hold technique versus nuclear medicine HIDA scan. Electronically Signed   By: Keith Rake M.D.   On: 11/27/2017 04:19    Time Spent in minutes  30   Lala Lund M.D on 11/27/2017 at 10:48 AM  To page go to [email or web address] - password [email or web address]

## 2017-11-27 NOTE — Consult Note (Signed)
Consultation  Referring Provider:   Dr. Candiss Norse Primary Care Physician:  Milus Banister, MD Primary Gastroenterologist: Dr. Ardis Hughs      Reason for Consultation: Abdominal pain and elevated LFTs             HPI:   Ryan Roach is a 72 y.o. male with a past medical history as listed below including ERCP with sphincterotomy for cholelithiasis in 2017, who presented to the ER today with a complaint of nausea over the past week.  Also describes increased right upper quadrant epigastric pain.     Today, patient's wife is by his side, they explain that patient has had previous episode of the same thing about a couple years ago. The only difference in this instance is that he has felt unwell for the past week with nausea, unable to eat much of anything, he started with worsening RUQ pain yesterday, rated as a 7-8/10, he proceeded to the ER. Patient requests that if he needs his gallbladder out that this is done at this visit so he doesn't have to come back.     Describes history of having MRI at [facility] where his walker was flung against the machine.    Denies fever, chills, weight loss or vomiting.     ER course: T 99.3, P 96, BP 105/68, pulse ox 99% on room air, AST 238, ALT 123, alk phos 167, T bili 2.5, white blood cell count 10.2, hemoglobin 14.3, platelets 370; CT of the abdomen pelvis shows recurrent intra-and extrahepatic biliary duct dilatation, common bile duct measures 11 mm, mild gallbladder wall thickening with pericholecystic edema suggest acute gallbladder inflammation with no abnormal gallbladder distention, no other acute findings  Previous GI history: 12/25/2015 colonoscopy Dr. Ardis Hughs: To 2-3 mm polyps in the transverse colon removed with a cold snare, exam otherwise normal; pathology showed typical adenomatous polyps, recall placed for 5 years 05/29/2015 ERCP: Retained common bile duct stones were removed with balloon, slight narrowing in the mid CBD which allowed for  easy balloon passage and not delayed contrast from passing of the duodenum, slight stricture was sampled with cytology rest, underlying etiology may simply be stone disease given that no masses are noted on EUS just prior to the exam, distant common bile duct is mildly thickened on my exam there was a slight stricture in the mid CBD on this exam, he will have to be followed closely with repeat liver tests and perhaps repeat imaging 05/29/2015 EUS: Previously placed plastic stent was seen in the ampulla, stent lumen appeared to be plugged with bile debris, previously placed biliary stent was removed with a snare and sent to cytology, 2 stone fragments were passed in the duodenum, upper GI tract is otherwise normal, and the sonographic finding showed no discrete masses or signs of chronic pancreatitis, the wall the distal 2-3 cm of the common bile duct was somewhat thickened and appearing, proximal to this the CBD was normal-appearing, diameter 5-6 mm across and contain no obvious stones, thickened gallbladder wall; it was discussed at some point patient may need his gallbladder removed  Past Medical History:  Diagnosis Date  . Anxiety   . Arthritis   . 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 Pancreatic mass 05/14/15  . PONV (postoperative nausea and vomiting)     Past Surgical History:  Procedure Laterality Date  . COLONOSCOPY WITH PROPOFOL N/A 12/25/2015   Procedure:  COLONOSCOPY WITH PROPOFOL;  Surgeon: Milus Banister, MD;  Location: [facility] ENDOSCOPY;  Service: Endoscopy;  Laterality: N/A;  . ENDOSCOPIC RETROGRADE CHOLANGIOPANCREATOGRAPHY (ERCP) WITH PROPOFOL N/A 05/29/2015   Procedure: ENDOSCOPIC RETROGRADE CHOLANGIOPANCREATOGRAPHY (ERCP) WITH PROPOFOL;  Surgeon: Milus Banister, MD;  Location: [facility] ENDOSCOPY;  Service: Endoscopy;  Laterality: N/A;  . ERCP N/A 05/15/2015   Procedure: ENDOSCOPIC RETROGRADE  CHOLANGIOPANCREATOGRAPHY (ERCP);  Surgeon: Doran Stabler, MD;  Location: [facility] ENDOSCOPY;  Service: Endoscopy;  Laterality: N/A;  . EUS N/A 05/29/2015   Procedure: UPPER ENDOSCOPIC ULTRASOUND (EUS) LINEAR;  Surgeon: Milus Banister, MD;  Location: [facility] ENDOSCOPY;  Service: Endoscopy;  Laterality: N/A;  . JOINT REPLACEMENT     RTKA  . TONSILLECTOMY    . TOTAL KNEE ARTHROPLASTY Right 05/25/2012   Procedure: RIGHT TOTAL KNEE ARTHROPLASTY;  Surgeon: Tobi Bastos, MD;  Location: [facility] ORS;  Service: Orthopedics;  Laterality: Right;    Family History  Problem Relation Age of Onset  . Breast cancer Mother     Social History   Tobacco Use  . Smoking status: Never Smoker  . Smokeless tobacco: Current User    Types: Chew  Substance Use Topics  . Alcohol use: No  . Drug use: No    Prior to Admission medications   Medication Sig Start Date End Date Taking? Authorizing Provider  acetaminophen (TYLENOL) 500 MG tablet Take 500-1,000 mg by mouth every 4 (four) hours as needed for headache.    Yes [provider]  citalopram (CELEXA) 20 MG tablet Take 20 mg by mouth daily. 11/05/17  Yes [provider]    Current Facility-Administered Medications  Medication Dose Route Frequency Provider Last Rate Last Dose  . 0.9 % NaCl with KCl 20 mEq/ L  infusion   Intravenous Continuous Jani Gravel, MD 100 mL/hr at 11/27/17 0816    . famotidine (PEPCID) IVPB 20 mg premix  20 mg Intravenous Marjean Donna, MD 100 mL/hr at 11/27/17 0937 20 mg at 11/27/17 0937  . heparin injection 5,000 Units  5,000 Units Subcutaneous Q8H Lala Lund K, MD      . metroNIDAZOLE (FLAGYL) IVPB 500 mg  500 mg Intravenous Q8H Lala Lund K, MD      . morphine 2 MG/ML injection 2 mg  2 mg Intravenous Q4H PRN Thurnell Lose, MD      . ondansetron [facility]) injection 4 mg  4 mg Intravenous Q6H PRN Jani Gravel, MD   4 mg at 11/27/17 0949  . potassium chloride SA (K-DUR,KLOR-CON) CR tablet 40 mEq  40 mEq Oral  Once Thurnell Lose, MD        Allergies as of 11/27/2017  . (No Known Allergies)     Review of Systems:    Constitutional: No weight loss, fever or chills Skin: No rash Cardiovascular: No chest pain Respiratory: No SOB  Gastrointestinal: See HPI and otherwise negative Genitourinary: No dysuria  Neurological: No headache, dizziness or syncope Musculoskeletal: No new muscle or joint pain Hematologic: No bleeding Psychiatric: No history of depression or anxiety    Physical Exam:  Vital signs in last 24 hours: Temp:  [98.8 F (37.1 C)-99.4 F (37.4 C)] 98.8 F (37.1 C) (10/20 0658) Pulse Rate:  [94-109] 109 (10/20 0658) Resp:  [16-18] 16 (10/20 0545) BP: (103-147)/(48-118) 127/84 (10/20 0658) SpO2:  [90 %-99 %] 97 % (10/20 0658) Weight:  [145.2 kg] 145.2 kg (10/20 0054)   General:   Pleasant overweight Caucasian male appears to be in  NAD, Well developed, Well nourished, alert and cooperative Head:  Normocephalic and atraumatic. Eyes:   PEERL, EOMI. No icterus. Conjunctiva pink. Ears:  Normal auditory acuity. Neck:  Supple Throat: Oral cavity and pharynx without inflammation, swelling or lesion.  Lungs: Respirations even and unlabored. Lungs clear to auscultation bilaterally.   No wheezes, crackles, or rhonchi.  Heart: Normal S1, S2. No MRG. Regular rate and rhythm. No peripheral edema, cyanosis or pallor.  Abdomen:  Soft, nondistended, moderate RUQ ttp. No rebound or guarding. Normal bowel sounds. No appreciable masses or hepatomegaly. Rectal:  Not performed.  Msk:  Symmetrical without gross deformities. Peripheral pulses intact.  Extremities:  Without edema, no deformity or joint abnormality. Neurologic:  Alert and  oriented x4;  grossly normal neurologically.   Skin:   Dry and intact without significant lesions or rashes. Psychiatric: Demonstrates good judgement and reason without abnormal affect or behaviors.  LAB RESULTS: Recent Labs    11/27/17 0058  WBC 10.2    HGB 14.3  HCT 46.7  PLT 370   BMET Recent Labs    11/27/17 0058  NA 139  K 3.2*  CL 104  CO2 26  GLUCOSE 128*  BUN 18  CREATININE 0.76  CALCIUM 8.7*   LFT Recent Labs    11/27/17 0058 11/27/17 0312  PROT 7.8  --   ALBUMIN 3.0*  --   AST 238*  --   ALT 123*  --   ALKPHOS 167*  --   BILITOT 2.5*  --   BILIDIR  --  1.2*    STUDIES: Ct Abdomen Pelvis W Contrast  Result Date: 11/27/2017 CLINICAL DATA:  Abdominal pain. Left lower and left upper quadrant pain, onset yesterday. Nausea. EXAM: CT ABDOMEN AND PELVIS WITH CONTRAST TECHNIQUE: Multidetector CT imaging of the abdomen and pelvis was performed using the standard protocol following bolus administration of intravenous contrast. CONTRAST:  162m OMNIPAQUE IOHEXOL 300 MG/ML  SOLN COMPARISON:  Right upper quadrant ultrasound earlier this day. MRCP 06/26/2016. Abdominal CT 05/14/2015 FINDINGS: Lower chest: No consolidation or pleural fluid. Hepatobiliary: Recurrent intra and extrahepatic biliary ductal dilatation with common bile duct measuring 11 mm. Gallbladder physiologically distended with wall thickening and mild pericholecystic stranding. No calcified choledocholithiasis. No focal hepatic lesion. Pancreas: No ductal dilatation or inflammation. The previous questioned hypodense mass on CT in the pancreatic head is not seen. Spleen: Normal in size without focal abnormality. Adrenals/Urinary Tract: Normal adrenal glands. No hydronephrosis or perinephric edema. Homogeneous renal enhancement with symmetric excretion on delayed phase imaging. Urinary bladder is physiologically distended without wall thickening. Stomach/Bowel: Stomach is within normal limits. Appendix appears normal. No evidence of bowel wall thickening, distention, or inflammatory changes. Vascular/Lymphatic: Small left central mesenteric nodes with adjacent mesenteric edema, unchanged from prior exam. Aortic atherosclerosis without aneurysm. An 11 mm portal caval node  is unchanged from prior and likely reactive. Reproductive: Enlarged prostate gland spanning 6.8 cm transverse. Other: No free air or ascites. Fat containing umbilical hernia with diastasis of the anterior abdominal wall musculature. Musculoskeletal: Degenerative change in both hips and throughout the lumbar spine. There are no acute or suspicious osseous abnormalities. IMPRESSION: 1. Recurrent intra and extrahepatic biliary ductal dilatation, common bile duct measures 11 mm. Mild gallbladder wall thickening with pericholecystic edema suggests acute gallbladder inflammation, no abnormal gallbladder distention. MRCP or ERCP may be of value. 2. No other acute finding in the abdomen or pelvis. 3. Incidental findings of prostatomegaly and Aortic Atherosclerosis (ICD10-I70.0). Electronically Signed   By: MKeith Rake  M.D.   On: 11/27/2017 04:51   US Abdomen Limited Ruq  Result Date: 11/27/2017 CLINICAL DATA:  Right upper quadrant pain. EXAM: ULTRASOUND ABDOMEN LIMITED RIGHT UPPER QUADRANT COMPARISON:  MRCP 06/26/2016 FINDINGS: Technically limited exam due to body habitus. Gallbladder: Physiologically distended. Small stones and sludge in the gallbladder. Mild gallbladder wall thickening of 4 mm, wall thickness is not well assessed. No definite pericholecystic fluid. No sonographic Murphy sign noted by sonographer. Common bile duct: Diameter: 11 mm proximally. Liver: Diffusely increased and heterogeneous in echogenicity. Liver parenchyma is difficult to penetrate. Portal vein is patent on color Doppler imaging with normal direction of blood flow towards the liver. IMPRESSION: 1. Stones and sludge in the gallbladder with mild gallbladder wall thickening. No sonographic Murphy sign. 2. Mild biliary dilatation with increased common bile duct from prior MRCP, currently 11 mm, previously 6 mm. Recurrent choledocholithiasis is considered. Consider repeat MRCP if patient is able tolerate breath hold technique versus  nuclear medicine HIDA scan. Electronically Signed   By: Keith Rake M.D.   On: 11/27/2017 04:19    Impression / Plan:   Impression: 1.  Abdominal pain and elevated LFTs: with dilated CBD and h/o CBD stricture, patient will need ERCP for choledocholithiasis 2.  Abnormal imaging of the abdomen 3.  Hypokalemia: agree with correction  Plan: 1.  We will schedule patient for ERCP tomorrow with Dr. Rush Landmark. Did discuss risks, benefits, limitation and alternatives and the patient agrees to proceed. 2. Patient will be on clear liquid diet today and NPO after midnight 3. Continue other supportive measures including correction of Potassium which will need to be normal for procedure tomorrow 4. Please await any further recommendations from Dr. Hilarie Fredrickson later today  Thank you for your kind consultation, we will continue to follow.  Lavone Nian Reather Steller  11/27/2017, 11:30 AM

## 2017-11-27 NOTE — Plan of Care (Signed)
  Problem: Nutrition: Goal: Adequate nutrition will be maintained Outcome: Progressing   Problem: Safety: Goal: Ability to remain free from injury will improve Outcome: Progressing   Problem: Elimination: Goal: Will not experience complications related to bowel motility Outcome: Progressing   

## 2017-11-27 NOTE — ED Triage Notes (Signed)
Patient c/o LLQ & LUQ pain that began yesterday. Also c/o nausea.

## 2017-11-27 NOTE — H&P (View-Only) (Signed)
Consultation  Referring Provider:   Dr. Candiss Norse Primary Care Physician:  Milus Banister, MD Primary Gastroenterologist: Dr. Ardis Hughs      Reason for Consultation: Abdominal pain and elevated LFTs             HPI:   Ryan Roach is a 72 y.o. male with a past medical history as listed below including ERCP with sphincterotomy for cholelithiasis in 2017, who presented to the ER today with a complaint of nausea over the past week.  Also describes increased right upper quadrant epigastric pain.     Today, patient's wife is by his side, they explain that patient has had previous episode of the same thing about a couple years ago. The only difference in this instance is that he has felt unwell for the past week with nausea, unable to eat much of anything, he started with worsening RUQ pain yesterday, rated as a 7-8/10, he proceeded to the ER. Patient requests that if he needs his gallbladder out that this is done at this visit so he doesn't have to come back.     Describes history of having MRI at [facility] where his walker was flung against the machine.    Denies fever, chills, weight loss or vomiting.     ER course: T 99.3, P 96, BP 105/68, pulse ox 99% on room air, AST 238, ALT 123, alk phos 167, T bili 2.5, white blood cell count 10.2, hemoglobin 14.3, platelets 370; CT of the abdomen pelvis shows recurrent intra-and extrahepatic biliary duct dilatation, common bile duct measures 11 mm, mild gallbladder wall thickening with pericholecystic edema suggest acute gallbladder inflammation with no abnormal gallbladder distention, no other acute findings  Previous GI history: 12/25/2015 colonoscopy Dr. Ardis Hughs: To 2-3 mm polyps in the transverse colon removed with a cold snare, exam otherwise normal; pathology showed typical adenomatous polyps, recall placed for 5 years 05/29/2015 ERCP: Retained common bile duct stones were removed with balloon, slight narrowing in the mid CBD which allowed for  easy balloon passage and not delayed contrast from passing of the duodenum, slight stricture was sampled with cytology rest, underlying etiology may simply be stone disease given that no masses are noted on EUS just prior to the exam, distant common bile duct is mildly thickened on my exam there was a slight stricture in the mid CBD on this exam, he will have to be followed closely with repeat liver tests and perhaps repeat imaging 05/29/2015 EUS: Previously placed plastic stent was seen in the ampulla, stent lumen appeared to be plugged with bile debris, previously placed biliary stent was removed with a snare and sent to cytology, 2 stone fragments were passed in the duodenum, upper GI tract is otherwise normal, and the sonographic finding showed no discrete masses or signs of chronic pancreatitis, the wall the distal 2-3 cm of the common bile duct was somewhat thickened and appearing, proximal to this the CBD was normal-appearing, diameter 5-6 mm across and contain no obvious stones, thickened gallbladder wall; it was discussed at some point patient may need his gallbladder removed  Past Medical History:  Diagnosis Date  . Anxiety   . Arthritis   . 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 Pancreatic mass 05/14/15  . PONV (postoperative nausea and vomiting)     Past Surgical History:  Procedure Laterality Date  . COLONOSCOPY WITH PROPOFOL N/A 12/25/2015   Procedure:  COLONOSCOPY WITH PROPOFOL;  Surgeon: Milus Banister, MD;  Location: [facility] ENDOSCOPY;  Service: Endoscopy;  Laterality: N/A;  . ENDOSCOPIC RETROGRADE CHOLANGIOPANCREATOGRAPHY (ERCP) WITH PROPOFOL N/A 05/29/2015   Procedure: ENDOSCOPIC RETROGRADE CHOLANGIOPANCREATOGRAPHY (ERCP) WITH PROPOFOL;  Surgeon: Milus Banister, MD;  Location: [facility] ENDOS[facility];  Service: Endoscopy;  Laterality: N/A;  . ERCP N/A 05/15/2015   Procedure: ENDOSCOPIC RETROGRADE  CHOLANGIOPANCREATOGRAPHY (ERCP);  Surgeon: Doran Stabler, MD;  Location[facility] ENDOSCOPY;  Service: Endoscopy;  Laterality: N/A;  . EUS N/A 05/29/2015   Procedure: UPPER ENDOSCOPIC ULTRASOUND (EUS) LINEAR;  Surgeon: Milus Banister, MD;  Location: [facility] ENDOSCOPY;  Service: Endoscopy;  Laterality: N/A;  . JOINT REPLACEMENT     RTKA  . TONSILLECTOMY    . TOTAL KNEE ARTHROPLASTY Right 05/25/2012   Procedure: RIGHT TOTAL KNEE ARTHROPLASTY;  Surgeon: Tobi Bastos, MD;  Location: [facility];  Service: Orthopedics;  Laterality: Right;    Family History  Problem Relation Age of Onset  . Breast cancer Mother     Social History   Tobacco Use  . Smoking status: Never Smoker  . Smokeless tobacco: Current User    Types: Chew  Substance Use Topics  . Alcohol use: No  . Drug use: No    Prior to Admission medications   Medication Sig Start Date End Date Taking? Authorizing Provider  acetaminophen (TYLENOL) 500 MG tablet Take 500-1,000 mg by mouth every 4 (four) hours as needed for headache.    Yes [provider]  citalopram (CELEXA) 20 MG tablet Take 20 mg by mouth daily. 11/05/17  Yes [provider]    Current Facility-Administered Medications  Medication Dose Route Frequency Provider Last Rate Last Dose  . 0.9 % NaCl with KCl 20 mEq/ L  infusion   Intravenous Continuous Jani Gravel, MD 100 mL/hr at 11/27/17 0816    . famotidine (PEPCID) IVPB 20 mg premix  20 mg Intravenous Marjean Donna, MD 100 mL/hr at 11/27/17 0937 20 mg at 11/27/17 0937  . heparin injection 5,000 Units  5,000 Units Subcutaneous Q8H Lala Lund K, MD      . metroNIDAZOLE (FLAGYL) IVPB 500 mg  500 mg Intravenous Q8H Lala Lund K, MD      . morphine 2 MG/ML injection 2 mg  2 mg Intravenous Q4H PRN Thurnell Lose, MD      . ondansetron [facility]) injection 4 mg  4 mg Intravenous Q6H PRN Jani Gravel, MD   4 mg at 11/27/17 0949  . potassium chloride SA (K-DUR,KLOR-CON) CR tablet 40 mEq  40 mEq Oral  Once Thurnell Lose, MD        Allergies as of 11/27/2017  . (No Known Allergies)     Review of Systems:    Constitutional: No weight loss, fever or chills Skin: No rash Cardiovascular: No chest pain Respiratory: No SOB  Gastrointestinal: See HPI and otherwise negative Genitourinary: No dysuria  Neurological: No headache, dizziness or syncope Musculoskeletal: No new muscle or joint pain Hematologic: No bleeding Psychiatric: No history of depression or anxiety    Physical Exam:  Vital signs in last 24 hours: Temp:  [98.8 F (37.1 C)-99.4 F (37.4 C)] 98.8 F (37.1 C) (10/20 0658) Pulse Rate:  [94-109] 109 (10/20 0658) Resp:  [16-18] 16 (10/20 0545) BP: (103-147)/(48-118) 127/84 (10/20 0658) SpO2:  [90 %-99 %] 97 % (10/20 0658) Weight:  [145.2 kg] 145.2 kg (10/20 0054)   General:   Pleasant overweight Caucasian male appears to be in  NAD, Well developed, Well nourished, alert and cooperative Head:  Normocephalic and atraumatic. Eyes:   PEERL, EOMI. No icterus. Conjunctiva pink. Ears:  Normal auditory acuity. Neck:  Supple Throat: Oral cavity and pharynx without inflammation, swelling or lesion.  Lungs: Respirations even and unlabored. Lungs clear to auscultation bilaterally.   No wheezes, crackles, or rhonchi.  Heart: Normal S1, S2. No MRG. Regular rate and rhythm. No peripheral edema, cyanosis or pallor.  Abdomen:  Soft, nondistended, moderate RUQ ttp. No rebound or guarding. Normal bowel sounds. No appreciable masses or hepatomegaly. Rectal:  Not performed.  Msk:  Symmetrical without gross deformities. Peripheral pulses intact.  Extremities:  Without edema, no deformity or joint abnormality. Neurologic:  Alert and  oriented x4;  grossly normal neurologically.   Skin:   Dry and intact without significant lesions or rashes. Psychiatric: Demonstrates good judgement and reason without abnormal affect or behaviors.  LAB RESULTS: Recent Labs    11/27/17 0058  WBC 10.2    HGB 14.3  HCT 46.7  PLT 370   BMET Recent Labs    11/27/17 0058  NA 139  K 3.2*  CL 104  CO2 26  GLUCOSE 128*  BUN 18  CREATININE 0.76  CALCIUM 8.7*   LFT Recent Labs    11/27/17 0058 11/27/17 0312  PROT 7.8  --   ALBUMIN 3.0*  --   AST 238*  --   ALT 123*  --   ALKPHOS 167*  --   BILITOT 2.5*  --   BILIDIR  --  1.2*    STUDIES: Ct Abdomen Pelvis W Contrast  Result Date: 11/27/2017 CLINICAL DATA:  Abdominal pain. Left lower and left upper quadrant pain, onset yesterday. Nausea. EXAM: CT ABDOMEN AND PELVIS WITH CONTRAST TECHNIQUE: Multidetector CT imaging of the abdomen and pelvis was performed using the standard protocol following bolus administration of intravenous contrast. CONTRAST:  154m OMNIPAQUE IOHEXOL 300 MG/ML  SOLN COMPARISON:  Right upper quadrant ultrasound earlier this day. MRCP 06/26/2016. Abdominal CT 05/14/2015 FINDINGS: Lower chest: No consolidation or pleural fluid. Hepatobiliary: Recurrent intra and extrahepatic biliary ductal dilatation with common bile duct measuring 11 mm. Gallbladder physiologically distended with wall thickening and mild pericholecystic stranding. No calcified choledocholithiasis. No focal hepatic lesion. Pancreas: No ductal dilatation or inflammation. The previous questioned hypodense mass on CT in the pancreatic head is not seen. Spleen: Normal in size without focal abnormality. Adrenals/Urinary Tract: Normal adrenal glands. No hydronephrosis or perinephric edema. Homogeneous renal enhancement with symmetric excretion on delayed phase imaging. Urinary bladder is physiologically distended without wall thickening. Stomach/Bowel: Stomach is within normal limits. Appendix appears normal. No evidence of bowel wall thickening, distention, or inflammatory changes. Vascular/Lymphatic: Small left central mesenteric nodes with adjacent mesenteric edema, unchanged from prior exam. Aortic atherosclerosis without aneurysm. An 11 mm portal caval node  is unchanged from prior and likely reactive. Reproductive: Enlarged prostate gland spanning 6.8 cm transverse. Other: No free air or ascites. Fat containing umbilical hernia with diastasis of the anterior abdominal wall musculature. Musculoskeletal: Degenerative change in both hips and throughout the lumbar spine. There are no acute or suspicious osseous abnormalities. IMPRESSION: 1. Recurrent intra and extrahepatic biliary ductal dilatation, common bile duct measures 11 mm. Mild gallbladder wall thickening with pericholecystic edema suggests acute gallbladder inflammation, no abnormal gallbladder distention. MRCP or ERCP may be of value. 2. No other acute finding in the abdomen or pelvis. 3. Incidental findings of prostatomegaly and Aortic Atherosclerosis (ICD10-I70.0). Electronically Signed   By: MKeith Rake  M.D.   On: 11/27/2017 04:51   US Abdomen Limited Ruq  Result Date: 11/27/2017 CLINICAL DATA:  Right upper quadrant pain. EXAM: ULTRASOUND ABDOMEN LIMITED RIGHT UPPER QUADRANT COMPARISON:  MRCP 06/26/2016 FINDINGS: Technically limited exam due to body habitus. Gallbladder: Physiologically distended. Small stones and sludge in the gallbladder. Mild gallbladder wall thickening of 4 mm, wall thickness is not well assessed. No definite pericholecystic fluid. No sonographic Murphy sign noted by sonographer. Common bile duct: Diameter: 11 mm proximally. Liver: Diffusely increased and heterogeneous in echogenicity. Liver parenchyma is difficult to penetrate. Portal vein is patent on color Doppler imaging with normal direction of blood flow towards the liver. IMPRESSION: 1. Stones and sludge in the gallbladder with mild gallbladder wall thickening. No sonographic Murphy sign. 2. Mild biliary dilatation with increased common bile duct from prior MRCP, currently 11 mm, previously 6 mm. Recurrent choledocholithiasis is considered. Consider repeat MRCP if patient is able tolerate breath hold technique versus  nuclear medicine HIDA scan. Electronically Signed   By: Keith Rake M.D.   On: 11/27/2017 04:19    Impression / Plan:   Impression: 1.  Abdominal pain and elevated LFTs: with dilated CBD and h/o CBD stricture, patient will need ERCP for choledocholithiasis 2.  Abnormal imaging of the abdomen 3.  Hypokalemia: agree with correction  Plan: 1.  We will schedule patient for ERCP tomorrow with Dr. Rush Landmark. Did discuss risks, benefits, limitation and alternatives and the patient agrees to proceed. 2. Patient will be on clear liquid diet today and NPO after midnight 3. Continue other supportive measures including correction of Potassium which will need to be normal for procedure tomorrow 4. Please await any further recommendations from Dr. Hilarie Fredrickson later today  Thank you for your kind consultation, we will continue to follow.  Lavone Nian Lemmon  11/27/2017, 11:30 AM

## 2017-11-27 NOTE — Consult Note (Signed)
Reason for Consult: Gallstones with history of choledocholithiasis Referring Physician: Candiss Norse MD  Ryan Roach is an 72 y.o. male.  HPI: Asked to see patient at the request of Dr. Candiss Norse for recurrent gallbladder disease with history of choledocholithiasis status post ERCP in 2017.  He had a history of elevation of his transaminases.  He was readmitted with a chief complaint of right upper quadrant abdominal pain.  Sharp in nature location is right upper quadrant and without radiation.  There have been no fever or chills.  CT scan showed mild dilation of his common bile duct and intrahepatic ducts with mild stranding of the gallbladder consistent with cholecystitis.  Currently he is pain-free.  Ultrasound shows gallstones and mild gallbladder wall thickening.  Negative Murphy sign.  Past Medical History:  Diagnosis Date  . Anxiety   . Arthritis   . 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 Pancreatic mass 05/14/15  . PONV (postoperative nausea and vomiting)     Past Surgical History:  Procedure Laterality Date  . COLONOSCOPY WITH PROPOFOL N/A 12/25/2015   Procedure: COLONOSCOPY WITH PROPOFOL;  Surgeon: Milus Banister, MD;  Location: [facility] ENDOSCOPY;  Service: Endoscopy;  Laterality: N/A;  . ENDOSCOPIC RETROGRADE CHOLANGIOPANCREATOGRAPHY (ERCP) WITH PROPOFOL N/A 05/29/2015   Procedure: ENDOSCOPIC RETROGRADE CHOLANGIOPANCREATOGRAPHY (ERCP) WITH PROPOFOL;  Surgeon: Milus Banister, MD;  Location: [facility];  Service: Endoscopy;  Laterality: N/A;  . ERCP N/A 05/15/2015   Procedure: ENDOSCOPIC RETROGRADE CHOLANGIOPANCREATOGRAPHY (ERCP);  Surgeon: Doran Stabler, MD;  Location: [facility] ENDOSCOPY;  Service: Endoscopy;  Laterality: N/A;  . EUS N/A 05/29/2015   Procedure: UPPER ENDOSCOPIC ULTRASOUND (EUS) LINEAR;  Surgeon: Milus Banister, MD;  Location: [facility]OSCOPY;  Service: Endoscopy;  Laterality: N/A;  . JOINT  REPLACEMENT     RTKA  . TONSILLECTOMY    . TOTAL KNEE ARTHROPLASTY Right 05/25/2012   Procedure: RIGHT TOTAL KNEE ARTHROPLASTY;  Surgeon: Tobi Bastos, MD;  Location: [facility];  Service: Orthopedics;  Laterality: Right;    Family History  Problem Relation Age of Onset  . Breast cancer Mother     Social History:  reports that he has never smoked. His smokeless tobacco use includes chew. He reports that he does not drink alcohol or use drugs.  Allergies: No Known Allergies  Medications: I have reviewed the patient's current medications.  Results for orders placed or performed during the hospital encounter of 11/27/17 (from the past 48 hour(s))  Urinalysis, Routine w reflex microscopic     Status: Abnormal   Collection Time: 11/27/17 12:55 AM  Result Value Ref Range   Color, Urine AMBER (A) YELLOW    Comment: BIOCHEMICALS MAY BE AFFECTED BY COLOR   APPearance CLEAR CLEAR   Specific Gravity, Urine 1.027 1.005 - 1.030   pH 5.0 5.0 - 8.0   Glucose, UA NEGATIVE NEGATIVE mg/dL   Hgb urine dipstick LARGE (A) NEGATIVE   Bilirubin Urine SMALL (A) NEGATIVE   Ketones, ur NEGATIVE NEGATIVE mg/dL   Protein, ur NEGATIVE NEGATIVE mg/dL   Nitrite NEGATIVE NEGATIVE   Leukocytes, UA NEGATIVE NEGATIVE   RBC / HPF >50 (H) 0 - 5 RBC/hpf   WBC, UA 0-5 0 - 5 WBC/hpf   Bacteria, UA NONE SEEN NONE SEEN   Squamous Epithelial / LPF 0-5 0 - 5   Mucus PRESENT    Hyaline Casts, UA PRESENT     Comment: Performed at [facility], [place] [address], [place], [place] [address]  Lipase, blood     Status: None   Collection Time: 11/27/17 12:58 AM  Result Value Ref Range   Lipase 37 11 - 51 U/L    Comment: Performed at [facility] [address] [place], [place] [address]  Comprehensive metabolic panel     Status: Abnormal   Collection Time: 11/27/17 12:58 AM  Result Value Ref Range   Sodium 139 135 - 145 mmol/L   Potassium 3.2 (L) 3.5 - 5.1 mmol/L   Chloride 104 98 - 111 mmol/L   CO2 26 22  - 32 mmol/L   Glucose, Bld 128 (H) 70 - 99 mg/dL   BUN 18 8 - 23 mg/dL   Creatinine, Ser 0.76 0.61 - 1.24 mg/dL   Calcium 8.7 (L) 8.9 - 10.3 mg/dL   Total Protein 7.8 6.5 - 8.1 g/dL   Albumin 3.0 (L) 3.5 - 5.0 g/dL   AST 238 (H) 15 - 41 U/L   ALT 123 (H) 0 - 44 U/L   Alkaline Phosphatase 167 (H) 38 - 126 U/L   Total Bilirubin 2.5 (H) 0.3 - 1.2 mg/dL   GFR calc non Af Amer >60 >60 mL/min   GFR calc Af Amer >60 >60 mL/min    Comment: (NOTE) The eGFR has been calculated using the CKD EPI equation. This calculation has not been validated in all clinical situations. eGFR's persistently <60 mL/min signify possible Chronic Kidney Disease.    Anion gap 9 5 - 15    Comment: Performed at [facility] [address], [place] [address]  CBC     Status: None   Collection Time: 11/27/17 12:58 AM  Result Value Ref Range   WBC 10.2 4.0 - 10.5 K/uL   RBC 5.14 4.22 - 5.81 MIL/uL   Hemoglobin 14.3 13.0 - 17.0 g/dL   HCT 46.7 39.0 - 52.0 %   MCV 90.9 80.0 - 100.0 fL   MCH 27.8 26.0 - 34.0 pg   MCHC 30.6 30.0 - 36.0 g/dL   RDW 12.9 11.5 - 15.5 %   Platelets 370 150 - 400 K/uL   nRBC 0.0 0.0 - 0.2 %    Comment: Performed at [facility], [place] [address], [place], [place] [address]  Bilirubin, direct     Status: Abnormal   Collection Time: 11/27/17  3:12 AM  Result Value Ref Range   Bilirubin, Direct 1.2 (H) 0.0 - 0.2 mg/dL    Comment: Performed at [facility] [address], [place], [place] [address]  I-Stat CG4 Lactic Acid, ED     Status: None   Collection Time: 11/27/17  3:35 AM  Result Value Ref Range   Lactic Acid, Venous 1.18 0.5 - 1.9 mmol/L  Comprehensive metabolic panel     Status: Abnormal   Collection Time: 11/27/17  7:23 AM  Result Value Ref Range   Sodium 140 135 - 145 mmol/L   Potassium 3.4 (L) 3.5 - 5.1 mmol/L   Chloride 104 98 - 111 mmol/L   CO2 26 22 - 32 mmol/L   Glucose, Bld 118 (H) 70 - 99 mg/dL   BUN 15 8 - 23 mg/dL   Creatinine, Ser  0.73 0.61 - 1.24 mg/dL   Calcium 8.3 (L) 8.9 - 10.3 mg/dL   Total Protein 7.3 6.5 - 8.1 g/dL   Albumin 3.0 (L) 3.5 - 5.0 g/dL   AST 252 (H) 15 -  41 U/L   ALT 142 (H) 0 - 44 U/L   Alkaline Phosphatase 163 (H) 38 - 126 U/L   Total Bilirubin 3.4 (H) 0.3 - 1.2 mg/dL   GFR calc non Af Amer >60 >60 mL/min   GFR calc Af Amer >60 >60 mL/min    Comment: (NOTE) The eGFR has been calculated using the CKD EPI equation. This calculation has not been validated in all clinical situations. eGFR's persistently <60 mL/min signify possible Chronic Kidney Disease.    Anion gap 10 5 - 15    Comment: Performed at [facility] [address], [place], [place] [address]  Magnesium     Status: None   Collection Time: 11/27/17  7:23 AM  Result Value Ref Range   Magnesium 1.9 1.7 - 2.4 mg/dL    Comment: Performed at [facility] [address], [place], [place] [address]  Lipase, blood     Status: None   Collection Time: 11/27/17  7:23 AM  Result Value Ref Range   Lipase 28 11 - 51 U/L    Comment: Performed at [facility] [address], [place], [place] [address]  Protime-INR     Status: None   Collection Time: 11/27/17 11:36 AM  Result Value Ref Range   Prothrombin Time 14.9 11.4 - 15.2 seconds   INR 1.18     Comment: Performed at [facility] [address], [place], [place] [address]    Ct Abdomen Pelvis W Contrast  Result Date: 11/27/2017 CLINICAL DATA:  Abdominal pain. Left lower and left upper quadrant pain, onset yesterday. Nausea. EXAM: CT ABDOMEN AND PELVIS WITH CONTRAST TECHNIQUE: Multidetector CT imaging of the abdomen and pelvis was performed using the standard protocol following bolus administration of intravenous contrast. CONTRAST:  15m OMNIPAQUE IOHEXOL 300 MG/ML  SOLN COMPARISON:  Right upper quadrant ultrasound earlier this day. MRCP 06/26/2016. Abdominal CT 05/14/2015 FINDINGS: Lower chest: No consolidation or pleural fluid. Hepatobiliary: Recurrent  intra and extrahepatic biliary ductal dilatation with common bile duct measuring 11 mm. Gallbladder physiologically distended with wall thickening and mild pericholecystic stranding. No calcified choledocholithiasis. No focal hepatic lesion. Pancreas: No ductal dilatation or inflammation. The previous questioned hypodense mass on CT in the pancreatic head is not seen. Spleen: Normal in size without focal abnormality. Adrenals/Urinary Tract: Normal adrenal glands. No hydronephrosis or perinephric edema. Homogeneous renal enhancement with symmetric excretion on delayed phase imaging. Urinary bladder is physiologically distended without wall thickening. Stomach/Bowel: Stomach is within normal limits. Appendix appears normal. No evidence of bowel wall thickening, distention, or inflammatory changes. Vascular/Lymphatic: Small left central mesenteric nodes with adjacent mesenteric edema, unchanged from prior exam. Aortic atherosclerosis without aneurysm. An 11 mm portal caval node is unchanged from prior and likely reactive. Reproductive: Enlarged prostate gland spanning 6.8 cm transverse. Other: No free air or ascites. Fat containing umbilical hernia with diastasis of the anterior abdominal wall musculature. Musculoskeletal: Degenerative change in both hips and throughout the lumbar spine. There are no acute or suspicious osseous abnormalities. IMPRESSION: 1. Recurrent intra and extrahepatic biliary ductal dilatation, common bile duct measures 11 mm. Mild gallbladder wall thickening with pericholecystic edema suggests acute gallbladder inflammation, no abnormal gallbladder distention. MRCP or ERCP may be of value. 2. No other acute finding in the abdomen or pelvis. 3. Incidental findings of prostatomegaly and Aortic Atherosclerosis (ICD10-I70.0). Electronically Signed   By: MKeith RakeM.D.   On: 11/27/2017 04:51   UKoreaAbdomen Limited Ruq  Result Date: 11/27/2017 CLINICAL DATA:  Right upper quadrant pain. EXAM:  ULTRASOUND ABDOMEN LIMITED RIGHT UPPER QUADRANT COMPARISON:  MRCP 06/26/2016 FINDINGS: Technically limited exam due to body habitus. Gallbladder: Physiologically distended. Small stones and sludge in the gallbladder. Mild gallbladder wall thickening of 4 mm, wall thickness is not well assessed. No definite pericholecystic fluid. No sonographic Murphy sign noted by sonographer. Common bile duct: Diameter: 11 mm proximally. Liver: Diffusely increased and heterogeneous in echogenicity. Liver parenchyma is difficult to penetrate. Portal vein is patent on color Doppler imaging with normal direction of blood flow towards the liver. IMPRESSION: 1. Stones and sludge in the gallbladder with mild gallbladder wall thickening. No sonographic Murphy sign. 2. Mild biliary dilatation with increased common bile duct from prior MRCP, currently 11 mm, previously 6 mm. Recurrent choledocholithiasis is considered. Consider repeat MRCP if patient is able tolerate breath hold technique versus nuclear medicine HIDA scan. Electronically Signed   By: Keith Rake M.D.   On: 11/27/2017 04:19    Review of Systems  Constitutional: Positive for chills. Negative for fever.  HENT: Negative for hearing loss.   Eyes: Negative.   Cardiovascular: Negative.   Gastrointestinal: Positive for abdominal pain.  Skin: Negative for rash.  All other systems reviewed and are negative.  Blood pressure 127/84, pulse (!) 109, temperature 98.8 F (37.1 C), resp. rate 16, height 5' 11"  (1.803 m), weight (!) 145.2 kg, SpO2 97 %. Physical Exam  Constitutional: He is oriented to person, place, and time. He appears well-developed and well-nourished.  HENT:  Head: Normocephalic.  Eyes: Pupils are equal, round, and reactive to light. No scleral icterus.  Neck: Normal range of motion. Neck supple.  Cardiovascular: Normal rate and regular rhythm.  Respiratory: Effort normal.  GI: There is tenderness. There is positive Murphy's sign.   Musculoskeletal: Normal range of motion.  Neurological: He is alert and oriented to person, place, and time.  Skin: Skin is warm and dry.    Assessment/Plan: Acute cholecystitis with history of elevation in transaminases status post ERCP in 2017 for choledocholithiasis    GI evaluating for possible recurrent ERCP  He will need laparoscopic cholecystectomy once the above issues have been evaluated.  I discussed this with the patient and his wife today at the bedside.  We will follow along. Arrin Ishler A [place] Lebeau 11/27/2017, 12:26 PM

## 2017-11-28 ENCOUNTER — Encounter (HOSPITAL_COMMUNITY): Payer: Self-pay | Admitting: Anesthesiology

## 2017-11-28 ENCOUNTER — Observation Stay (HOSPITAL_COMMUNITY): Payer: Medicare HMO | Admitting: Anesthesiology

## 2017-11-28 ENCOUNTER — Encounter (HOSPITAL_COMMUNITY)
Admission: EM | Disposition: A | Discharge status: Home Health | Payer: Self-pay | Source: Home / Self Care | Attending: Internal Medicine

## 2017-11-28 ENCOUNTER — Observation Stay (HOSPITAL_COMMUNITY): Payer: Medicare HMO

## 2017-11-28 DIAGNOSIS — K8050 Calculus of bile duct without cholangitis or cholecystitis without obstruction: Secondary | ICD-10-CM

## 2017-11-28 DIAGNOSIS — F418 Other specified anxiety disorders: Secondary | ICD-10-CM | POA: Diagnosis present

## 2017-11-28 DIAGNOSIS — N400 Enlarged prostate without lower urinary tract symptoms: Secondary | ICD-10-CM | POA: Diagnosis present

## 2017-11-28 DIAGNOSIS — R945 Abnormal results of liver function studies: Secondary | ICD-10-CM | POA: Diagnosis not present

## 2017-11-28 DIAGNOSIS — Z6841 Body Mass Index (BMI) 40.0 and over, adult: Secondary | ICD-10-CM | POA: Diagnosis not present

## 2017-11-28 DIAGNOSIS — K8067 Calculus of gallbladder and bile duct with acute and chronic cholecystitis with obstruction: Secondary | ICD-10-CM | POA: Diagnosis present

## 2017-11-28 DIAGNOSIS — K838 Other specified diseases of biliary tract: Secondary | ICD-10-CM | POA: Diagnosis not present

## 2017-11-28 DIAGNOSIS — Z96651 Presence of right artificial knee joint: Secondary | ICD-10-CM | POA: Diagnosis present

## 2017-11-28 DIAGNOSIS — I5032 Chronic diastolic (congestive) heart failure: Secondary | ICD-10-CM | POA: Diagnosis present

## 2017-11-28 DIAGNOSIS — Z720 Tobacco use: Secondary | ICD-10-CM | POA: Diagnosis not present

## 2017-11-28 DIAGNOSIS — K8021 Calculus of gallbladder without cholecystitis with obstruction: Secondary | ICD-10-CM | POA: Diagnosis not present

## 2017-11-28 DIAGNOSIS — I700 Atherosclerosis of aorta: Secondary | ICD-10-CM | POA: Diagnosis present

## 2017-11-28 DIAGNOSIS — E876 Hypokalemia: Secondary | ICD-10-CM | POA: Diagnosis present

## 2017-11-28 DIAGNOSIS — E875 Hyperkalemia: Secondary | ICD-10-CM | POA: Diagnosis not present

## 2017-11-28 HISTORY — PX: ERCP: SHX60

## 2017-11-28 HISTORY — PX: ERCP: SHX5425

## 2017-11-28 HISTORY — PX: REMOVAL OF STONES: SHX5545

## 2017-11-28 LAB — CBC
HCT: 38.7 % — ABNORMAL LOW (ref 39.0–52.0)
HEMOGLOBIN: 12.1 g/dL — AB (ref 13.0–17.0)
MCH: 28.5 pg (ref 26.0–34.0)
MCHC: 31.3 g/dL (ref 30.0–36.0)
MCV: 91.3 fL (ref 80.0–100.0)
Platelets: 288 10*3/uL (ref 150–400)
RBC: 4.24 MIL/uL (ref 4.22–5.81)
RDW: 12.9 % (ref 11.5–15.5)
WBC: 8.4 10*3/uL (ref 4.0–10.5)
nRBC: 0 % (ref 0.0–0.2)

## 2017-11-28 LAB — COMPREHENSIVE METABOLIC PANEL
ALK PHOS: 160 U/L — AB (ref 38–126)
ALT: 136 U/L — AB (ref 0–44)
AST: 165 U/L — ABNORMAL HIGH (ref 15–41)
Albumin: 2.5 g/dL — ABNORMAL LOW (ref 3.5–5.0)
Anion gap: 9 (ref 5–15)
BUN: 10 mg/dL (ref 8–23)
CALCIUM: 7.8 mg/dL — AB (ref 8.9–10.3)
CO2: 25 mmol/L (ref 22–32)
CREATININE: 0.64 mg/dL (ref 0.61–1.24)
Chloride: 105 mmol/L (ref 98–111)
GFR calc non Af Amer: >60 mL/min (ref >60)
Glucose, Bld: 96 mg/dL (ref 70–99)
Potassium: 3.2 mmol/L — ABNORMAL LOW (ref 3.5–5.1)
SODIUM: 139 mmol/L (ref 135–145)
Total Bilirubin: 5.3 mg/dL — ABNORMAL HIGH (ref 0.3–1.2)
Total Protein: 6.5 g/dL (ref 6.5–8.1)

## 2017-11-28 LAB — SURGICAL PCR SCREEN
MRSA, PCR: NEGATIVE
STAPHYLOCOCCUS AUREUS: NEGATIVE

## 2017-11-28 LAB — TYPE AND SCREEN
ABO/RH(D): A NEG
ANTIBODY SCREEN: NEGATIVE

## 2017-11-28 SURGERY — LAPAROSCOPIC CHOLECYSTECTOMY WITH INTRAOPERATIVE CHOLANGIOGRAM
Anesthesia: General

## 2017-11-28 SURGERY — ERCP, WITH INTERVENTION IF INDICATED
Anesthesia: General

## 2017-11-28 MED ORDER — METOPROLOL TARTRATE 5 MG/5ML IV SOLN: 5.0000 mg | INTRAVENOUS | Status: DC | PRN

## 2017-11-28 MED ORDER — LIDOCAINE 2% (20 MG/ML) 5 ML SYRINGE
INTRAMUSCULAR | Status: DC | PRN
  Administered 2017-11-28: 60 mg via INTRAVENOUS

## 2017-11-28 MED ORDER — SUCCINYLCHOLINE CHLORIDE 20 MG/ML IJ SOLN
INTRAMUSCULAR | Status: DC | PRN
  Administered 2017-11-28: 100 mg via INTRAVENOUS

## 2017-11-28 MED ORDER — LACTATED RINGERS IV SOLN
INTRAVENOUS | Status: AC
  Administered 2017-11-29: 01:00:00 via INTRAVENOUS

## 2017-11-28 MED ORDER — INDOMETHACIN 50 MG RE SUPP
RECTAL | Status: DC | PRN
  Administered 2017-11-28: 100 mg via RECTAL

## 2017-11-28 MED ORDER — ACETAMINOPHEN 500 MG PO TABS: 1000.0000 mg | ORAL_TABLET | ORAL | Status: DC

## 2017-11-28 MED ORDER — CITALOPRAM HYDROBROMIDE 20 MG PO TABS
20.0000 mg | ORAL_TABLET | Freq: Every day | ORAL | Status: DC
  Administered 2017-11-28 – 2017-11-30 (×3): 20 mg via ORAL
  Filled 2017-11-28 (×3): qty 1

## 2017-11-28 MED ORDER — HYDRALAZINE HCL 20 MG/ML IJ SOLN: 10.0000 mg | Freq: Four times a day (QID) | INTRAMUSCULAR | Status: DC | PRN

## 2017-11-28 MED ORDER — CHLORHEXIDINE GLUCONATE CLOTH 2 % EX PADS
6.0000 | MEDICATED_PAD | Freq: Once | CUTANEOUS | Status: AC
  Administered 2017-11-29: 6 via TOPICAL

## 2017-11-28 MED ORDER — DEXAMETHASONE SODIUM PHOSPHATE 10 MG/ML IJ SOLN
INTRAMUSCULAR | Status: DC | PRN
  Administered 2017-11-28: 10 mg via INTRAVENOUS

## 2017-11-28 MED ORDER — CELECOXIB 200 MG PO CAPS
200.0000 mg | ORAL_CAPSULE | ORAL | Status: DC
  Filled 2017-11-28: qty 1

## 2017-11-28 MED ORDER — FENTANYL CITRATE (PF) 100 MCG/2ML IJ SOLN
INTRAMUSCULAR | Status: DC | PRN
  Administered 2017-11-28: 100 ug via INTRAVENOUS
  Administered 2017-11-28: 50 ug via INTRAVENOUS
  Administered 2017-11-28: 25 ug via INTRAVENOUS

## 2017-11-28 MED ORDER — GLUCAGON HCL RDNA (DIAGNOSTIC) 1 MG IJ SOLR
INTRAMUSCULAR | Status: AC
  Filled 2017-11-28: qty 1

## 2017-11-28 MED ORDER — CHLORHEXIDINE GLUCONATE CLOTH 2 % EX PADS
6.0000 | MEDICATED_PAD | Freq: Once | CUTANEOUS | Status: AC
  Administered 2017-11-28: 6 via TOPICAL

## 2017-11-28 MED ORDER — IOPAMIDOL (ISOVUE-300) INJECTION 61%
INTRAVENOUS | Status: AC
  Filled 2017-11-28: qty 50

## 2017-11-28 MED ORDER — SODIUM CHLORIDE 0.9 % IV SOLN
INTRAVENOUS | Status: AC
  Administered 2017-11-28: 13:00:00 via INTRAVENOUS
  Filled 2017-11-28 (×2): qty 1000

## 2017-11-28 MED ORDER — GABAPENTIN 300 MG PO CAPS
300.0000 mg | ORAL_CAPSULE | ORAL | Status: AC
  Filled 2017-11-28: qty 1

## 2017-11-28 MED ORDER — DEXTROSE 5 % IV SOLN: 3.0000 g | INTRAVENOUS | Status: DC

## 2017-11-28 MED ORDER — INDOMETHACIN 50 MG RE SUPP
RECTAL | Status: AC
  Filled 2017-11-28: qty 2

## 2017-11-28 MED ORDER — CHLORHEXIDINE GLUCONATE CLOTH 2 % EX PADS: 6.0000 | MEDICATED_PAD | Freq: Once | CUTANEOUS | Status: DC

## 2017-11-28 MED ORDER — SUGAMMADEX SODIUM 500 MG/5ML IV SOLN
INTRAVENOUS | Status: DC | PRN
  Administered 2017-11-28: 580.8 mg via INTRAVENOUS

## 2017-11-28 MED ORDER — PROPOFOL 10 MG/ML IV BOLUS
INTRAVENOUS | Status: DC | PRN
  Administered 2017-11-28: 150 mg via INTRAVENOUS

## 2017-11-28 MED ORDER — ROCURONIUM BROMIDE 50 MG/5ML IV SOSY
PREFILLED_SYRINGE | INTRAVENOUS | Status: DC | PRN
  Administered 2017-11-28: 50 mg via INTRAVENOUS
  Administered 2017-11-28: 10 mg via INTRAVENOUS

## 2017-11-28 MED ORDER — SODIUM CHLORIDE 0.9 % IV SOLN
INTRAVENOUS | Status: DC
  Administered 2017-11-28: 07:00:00 via INTRAVENOUS

## 2017-11-28 MED ORDER — LACTATED RINGERS IV SOLN
INTRAVENOUS | Status: DC | PRN
  Administered 2017-11-28: 09:00:00 via INTRAVENOUS

## 2017-11-28 MED ORDER — PHENYLEPHRINE HCL 10 MG/ML IJ SOLN
INTRAMUSCULAR | Status: DC | PRN
  Administered 2017-11-28 (×2): 40 ug via INTRAVENOUS

## 2017-11-28 MED ORDER — INDOMETHACIN 50 MG RE SUPP
100.0000 mg | Freq: Once | RECTAL | Status: AC
  Administered 2017-11-28: 100 mg via RECTAL
  Filled 2017-11-28: qty 2

## 2017-11-28 MED ORDER — GLUCAGON HCL RDNA (DIAGNOSTIC) 1 MG IJ SOLR
INTRAMUSCULAR | Status: DC | PRN
  Administered 2017-11-28 (×3): .25 mg via INTRAVENOUS

## 2017-11-28 MED ORDER — SODIUM CHLORIDE 0.9 % IV SOLN
INTRAVENOUS | Status: DC | PRN
  Administered 2017-11-28: 40 mL

## 2017-11-28 MED ORDER — ACETAMINOPHEN 500 MG PO TABS
1000.0000 mg | ORAL_TABLET | ORAL | Status: DC
  Filled 2017-11-28: qty 2

## 2017-11-28 MED ORDER — DEXTROSE 5 % IV SOLN
3.0000 g | INTRAVENOUS | Status: DC
  Filled 2017-11-28: qty 3000

## 2017-11-28 MED ORDER — GABAPENTIN 300 MG PO CAPS: 300.0000 mg | ORAL_CAPSULE | ORAL | Status: DC

## 2017-11-28 NOTE — Progress Notes (Signed)
PROGRESS NOTE                                                                                                                                                                                                             Patient Demographics:    Ryan Roach, is a 72 y.o. male, DOB - 1945-07-17, [place] date - 11/27/2017   Admitting Physician Jani Gravel, MD  Outpatient Primary MD for the patient is Milus Banister, MD  LOS - 0  Chief Complaint  Patient presents with  . Abdominal Pain       Brief Narrative  Ryan Roach  is a 72 y.o. male, w h/o ERCP w sphincterotomy for cholelithiasis in 2017 apparently presents with c/o nausea over the past 1 week. Pt noted yesterday RUQ / epigastric pain, sharp.  Was admitted to the hospital for abdominal pain work-up suggestive of possible dilation and CBD along with possible acute cholecystitis.    Subjective:   Patient in bed, appears comfortable, denies any headache, no fever, no chest pain or pressure, no shortness of breath , no abdominal pain. No focal weakness.   Assessment  & Plan :     1.  Right upper quadrant abdominal pain.  It started in the right upper quadrant and somehow transitioned down to right lower quadrant, imaging consistent with dilated CBD and possible cholecystitis.  Being treated conservatively with bowel rest IV fluids and pain control, continue Rocephin and Flagyl, GI and surgery on board due for ERCP on 11/28/2017 thereafter cholecystectomy likely on 11/29/2017.  Stable preop INR chest x-ray and EKG all nonacute.  2.  Hypokalemia.  Replaced IV and will continue to monitor with magnesium levels.  3.  Morbid obesity.  Follow with PCP for weight loss.  4.  Diastolic CHF last EF around 50% on echocardiogram in 2017.  Compensated continue to monitor.  5.  Anxiety and depression.  On Celexa, resume once GI issues are better.    Family Communication  :  wife on 11/27/2017  Code Status :   Full  Disposition Plan  :  Med  Consults  :  GI, CCS  Procedures  :    ERCP.    Lap chole    CT - 1. Recurrent intra and extrahepatic biliary ductal dilatation, common bile duct measures 11 mm. Mild gallbladder wall thickening with pericholecystic edema suggests acute gallbladder inflammation, no abnormal gallbladder distention. MRCP or ERCP may be of value. 2. No other acute finding  in the abdomen or pelvis. 3. Incidental findings of prostatomegaly and Aortic Atherosclerosis (ICD10-I70.0)  DVT Prophylaxis  :  Heparin   Lab Results  Component Value Date   PLT 288 11/28/2017    Diet :  Diet Order            Diet NPO time specified  Diet effective now               Inpatient Medications Scheduled Meds: . Chlorhexidine Gluconate Cloth  6 each Topical Once  . indomethacin  100 mg Rectal Once   Continuous Infusions: . cefTRIAXone (ROCEPHIN)  IV 2 g (11/28/17 0657)  . famotidine (PEPCID) IV 20 mg (11/27/17 2258)  . lactated ringers    . metronidazole 500 mg (11/28/17 0459)  . 0.9 % sodium chloride with kcl     PRN Meds:.morphine injection, ondansetron (ZOFRAN) IV  Antibiotics  :   Anti-infectives (From admission, onward)   Start     Dose/Rate Route Frequency Ordered Stop   11/28/17 0600  cefTRIAXone (ROCEPHIN) 2 g in sodium chloride 0.9 % 100 mL IVPB     2 g 200 mL/hr over 30 Minutes Intravenous Every 24 hours 11/27/17 1400     11/27/17 1200  metroNIDAZOLE (FLAGYL) IVPB 500 mg     500 mg 100 mL/hr over 60 Minutes Intravenous Every 8 hours 11/27/17 1047     11/27/17 0515  cefTRIAXone (ROCEPHIN) 1 g in sodium chloride 0.9 % 100 mL IVPB     1 g 200 mL/hr over 30 Minutes Intravenous  Once 11/27/17 0502 11/27/17 0554        Objective:   Vitals:   11/27/17 0658 11/27/17 1627 11/27/17 2156 11/28/17 0548  BP: 127/84 124/81 (!) 133/52 (!) 115/56  Pulse: (!) 109 91 86 79  Resp:  20 18 19   Temp: 98.8 F (37.1 C) 98.4 F (36.9 C) 99.5 F (37.5 C) 99.1 F (37.3 C)   TempSrc:      SpO2: 97% 94% 92% 96%  Weight:      Height:        Wt Readings from Last 3 Encounters:  11/27/17 (!) 145.2 kg  12/25/15 (!) 142 kg  12/03/15 (!) 142 kg     Intake/Output Summary (Last 24 hours) at 11/28/2017 0825 Last data filed at 11/28/2017 0034 Gross per 24 hour  Intake -  Output 350 ml  Net -350 ml     Physical Exam  Awake Alert, Oriented X 3, No new F.N deficits, Normal affect [place].AT,PERRAL Supple Neck,No JVD, No cervical lymphadenopathy appriciated.  Symmetrical Chest wall movement, Good air movement bilaterally, CTAB RRR,No Gallops, Rubs or new Murmurs, No Parasternal Heave +ve B.Sounds, Abd Soft, No tenderness, No organomegaly appriciated, No rebound - guarding or rigidity. No Cyanosis, Clubbing or edema, No new Rash or bruise    Data Review:    CBC Recent Labs  Lab 11/27/17 0058 11/28/17 0537  WBC 10.2 8.4  HGB 14.3 12.1*  HCT 46.7 38.7*  PLT 370 288  MCV 90.9 91.3  MCH 27.8 28.5  MCHC 30.6 31.3  RDW 12.9 12.9    Chemistries  Recent Labs  Lab 11/27/17 0058 11/27/17 0723 11/28/17 0537  NA 139 140 139  K 3.2* 3.4* 3.2*  CL 104 104 105  CO2 26 26 25   GLUCOSE 128* 118* 96  BUN 18 15 10   CREATININE 0.76 0.73 0.64  CALCIUM 8.7* 8.3* 7.8*  MG  --  1.9  --   AST 238* 252*  165*  ALT 123* 142* 136*  ALKPHOS 167* 163* 160*  BILITOT 2.5* 3.4* 5.3*   ------------------------------------------------------------------------------------------------------------------ No results for input(s): CHOL, HDL, LDLCALC, TRIG, CHOLHDL, LDLDIRECT in the last 72 hours.  No results found for: HGBA1C ------------------------------------------------------------------------------------------------------------------ No results for input(s): TSH, T4TOTAL, T3FREE, THYROIDAB in the last 72 hours.  Invalid input(s): FREET3 ------------------------------------------------------------------------------------------------------------------ No results for  input(s): VITAMINB12, FOLATE, FERRITIN, TIBC, IRON, RETICCTPCT in the last 72 hours.  Coagulation profile Recent Labs  Lab 11/27/17 1136  INR 1.18    No results for input(s): DDIMER in the last 72 hours.  Cardiac Enzymes No results for input(s): CKMB, TROPONINI, MYOGLOBIN in the last 168 hours.  Invalid input(s): CK ------------------------------------------------------------------------------------------------------------------ No results found for: BNP  Micro Results Recent Results (from the past 240 hour(s))  Surgical pcr screen     Status: None   Collection Time: 11/28/17  5:25 AM  Result Value Ref Range Status   MRSA, PCR NEGATIVE NEGATIVE Final   Staphylococcus aureus NEGATIVE NEGATIVE Final    Comment: (NOTE) The Xpert SA Assay (FDA approved for NASAL specimens in patients 19 years of age and older), is one component of a comprehensive surveillance program. It is not intended to diagnose infection nor to guide or monitor treatment. Performed at [facility], [place] [address], [place], [place] [address]     Radiology Reports Dg Chest 2 View  Result Date: 11/27/2017 CLINICAL DATA:  Abdominal pain and nausea since yesterday. EXAM: CHEST - 2 VIEW COMPARISON:  PA and lateral chest 07/21/2016.  CT chest 05/13/2005. FINDINGS: The lungs are clear. Heart size is normal. No pneumothorax or pleural fluid. No acute or focal bony abnormality. IMPRESSION: No acute disease. Electronically Signed   By: Inge Rise M.D.   On: 11/27/2017 14:43   Ct Abdomen Pelvis W Contrast  Result Date: 11/27/2017 CLINICAL DATA:  Abdominal pain. Left lower and left upper quadrant pain, onset yesterday. Nausea. EXAM: CT ABDOMEN AND PELVIS WITH CONTRAST TECHNIQUE: Multidetector CT imaging of the abdomen and pelvis was performed using the standard protocol following bolus administration of intravenous contrast. CONTRAST:  157mL OMNIPAQUE IOHEXOL 300 MG/ML  SOLN COMPARISON:  Right upper  quadrant ultrasound earlier this day. MRCP 06/26/2016. Abdominal CT 05/14/2015 FINDINGS: Lower chest: No consolidation or pleural fluid. Hepatobiliary: Recurrent intra and extrahepatic biliary ductal dilatation with common bile duct measuring 11 mm. Gallbladder physiologically distended with wall thickening and mild pericholecystic stranding. No calcified choledocholithiasis. No focal hepatic lesion. Pancreas: No ductal dilatation or inflammation. The previous questioned hypodense mass on CT in the pancreatic head is not seen. Spleen: Normal in size without focal abnormality. Adrenals/Urinary Tract: Normal adrenal glands. No hydronephrosis or perinephric edema. Homogeneous renal enhancement with symmetric excretion on delayed phase imaging. Urinary bladder is physiologically distended without wall thickening. Stomach/Bowel: Stomach is within normal limits. Appendix appears normal. No evidence of bowel wall thickening, distention, or inflammatory changes. Vascular/Lymphatic: Small left central mesenteric nodes with adjacent mesenteric edema, unchanged from prior exam. Aortic atherosclerosis without aneurysm. An 11 mm portal caval node is unchanged from prior and likely reactive. Reproductive: Enlarged prostate gland spanning 6.8 cm transverse. Other: No free air or ascites. Fat containing umbilical hernia with diastasis of the anterior abdominal wall musculature. Musculoskeletal: Degenerative change in both hips and throughout the lumbar spine. There are no acute or suspicious osseous abnormalities. IMPRESSION: 1. Recurrent intra and extrahepatic biliary ductal dilatation, common bile duct measures 11 mm. Mild gallbladder wall thickening with pericholecystic edema suggests acute gallbladder inflammation,  no abnormal gallbladder distention. MRCP or ERCP may be of value. 2. No other acute finding in the abdomen or pelvis. 3. Incidental findings of prostatomegaly and Aortic Atherosclerosis (ICD10-I70.0). Electronically  Signed   By: Keith Rake M.D.   On: 11/27/2017 04:51   US Abdomen Limited Ruq  Result Date: 11/27/2017 CLINICAL DATA:  Right upper quadrant pain. EXAM: ULTRASOUND ABDOMEN LIMITED RIGHT UPPER QUADRANT COMPARISON:  MRCP 06/26/2016 FINDINGS: Technically limited exam due to body habitus. Gallbladder: Physiologically distended. Small stones and sludge in the gallbladder. Mild gallbladder wall thickening of 4 mm, wall thickness is not well assessed. No definite pericholecystic fluid. No sonographic Murphy sign noted by sonographer. Common bile duct: Diameter: 11 mm proximally. Liver: Diffusely increased and heterogeneous in echogenicity. Liver parenchyma is difficult to penetrate. Portal vein is patent on color Doppler imaging with normal direction of blood flow towards the liver. IMPRESSION: 1. Stones and sludge in the gallbladder with mild gallbladder wall thickening. No sonographic Murphy sign. 2. Mild biliary dilatation with increased common bile duct from prior MRCP, currently 11 mm, previously 6 mm. Recurrent choledocholithiasis is considered. Consider repeat MRCP if patient is able tolerate breath hold technique versus nuclear medicine HIDA scan. Electronically Signed   By: Keith Rake M.D.   On: 11/27/2017 04:19    Time Spent in minutes  30   Lala Lund M.D on 11/28/2017 at 8:25 AM  To page go to [email or web address] - password [facility]

## 2017-11-28 NOTE — Anesthesia Procedure Notes (Signed)
Procedure Name: Intubation Date/Time: 11/28/2017 9:30 AM Performed by: Neldon Newport, CRNA Pre-anesthesia Checklist: Timeout performed, Patient being monitored, Suction available, Emergency Drugs available and Patient identified Patient Re-evaluated:Patient Re-evaluated prior to induction Oxygen Delivery Method: Circle system utilized Preoxygenation: Pre-oxygenation with 100% oxygen Induction Type: IV induction and Rapid sequence Ventilation: Mask ventilation without difficulty and Oral airway inserted - appropriate to patient size Laryngoscope Size: McGraph and 4 Grade View: Grade I Tube type: Oral Tube size: 7.5 mm Number of attempts: 1 Placement Confirmation: breath sounds checked- equal and bilateral,  positive ETCO2 and ETT inserted through vocal cords under direct vision Secured at: 23 cm Tube secured with: Tape Dental Injury: Teeth and Oropharynx as per pre-operative assessment

## 2017-11-28 NOTE — Anesthesia Postprocedure Evaluation (Signed)
Anesthesia Post Note  Patient: Ryan Roach  Procedure(s) Performed: ENDOSCOPIC RETROGRADE CHOLANGIOPANCREATOGRAPHY (ERCP) (N/A ) BILIARY DILATION REMOVAL OF STONES     Patient location during evaluation: PACU Anesthesia Type: General Level of consciousness: awake and alert Pain management: pain level controlled Vital Signs Assessment: post-procedure vital signs reviewed and stable Respiratory status: spontaneous breathing, nonlabored ventilation, respiratory function stable and patient connected to nasal cannula oxygen Cardiovascular status: blood pressure returned to baseline and stable Postop Assessment: no apparent nausea or vomiting Anesthetic complications: no    Last Vitals:  Vitals:   11/28/17 1050 11/28/17 1100  BP: 123/69 (!) 144/58  Pulse: 76 69  Resp: (!) 26 18  Temp:    SpO2: 96% 94%    Last Pain:  Vitals:   11/28/17 1100  TempSrc:   PainSc: 0-No pain                 Niasia Lanphear S

## 2017-11-28 NOTE — Consult Note (Addendum)
[place] Nurse wound consult note Patient evaluated in [place] in the presence of his spouse.   Reason for Consult: Questionable DTI to sacrum Wound type: No wound present, patient has "Recliner butt", also known as Chronic Tissue Injury (CTI). Wound bed: Darkened tissue to buttocks and gluteal fold area, blanches.  Patient states he sits in his recliner the vast majority of the time.  Patient informed that if he doesn't want to develop pressure injuries to his buttocks, he needs to stand, walk, turn, etc to avoid pressure injuries to the area. Thank you for the consult.  Discussed plan of care with the patient and bedside nurse.  [place] nurse will not follow at this time.  Please re-consult the [place] team if needed.  Val Riles, RN, MSN, CWOCN, CNS-BC, pager [phone]

## 2017-11-28 NOTE — Progress Notes (Signed)
Patient Tolerated broth, jello, and ginger ale, advancing die to healthy hear. Patient assisted OOB to chair. WOC order for possible DTi to sacrum and patient has yeast present under l;eft breast

## 2017-11-28 NOTE — Progress Notes (Signed)
Day of Surgery  Subjective: Feels better less pain no vomiting. Prior ERCP 2017 for CBDstones, Dr. Ardis Hughs. Now with dilated ducts, RUQ pain, gallstones, T. Bili 5.3, WBC 8.4.  For ERCP today and hope for LC/IOC tomorrow  Objective: Vital signs in last 24 hours: Temp:  [98.4 F (36.9 C)-99.5 F (37.5 C)] 99.1 F (37.3 C) (10/21 0548) Pulse Rate:  [79-91] 79 (10/21 0548) Resp:  [18-20] 19 (10/21 0548) BP: (115-133)/(52-81) 115/56 (10/21 0548) SpO2:  [92 %-96 %] 96 % (10/21 0548)    Intake/Output from previous day: 10/20 0701 - 10/21 0700 In: -  Out: 350 [Urine:350] Intake/Output this shift: No intake/output data recorded.  General appearance: alert. mon. distress  Lungs: clear Abd;  Large abdomen. Mild tenderness ruq by no guarding and no mass. No peritonitis.  Lab Results:  Recent Labs    11/27/17 0058 11/28/17 0537  WBC 10.2 8.4  HGB 14.3 12.1*  HCT 46.7 38.7*  PLT 370 288   BMET Recent Labs    11/27/17 0723 11/28/17 0537  NA 140 139  K 3.4* 3.2*  CL 104 105  CO2 26 25  GLUCOSE 118* 96  BUN 15 10  CREATININE 0.73 0.64  CALCIUM 8.3* 7.8*   PT/INR Recent Labs    11/27/17 1136  LABPROT 14.9  INR 1.18   ABG No results for input(s): PHART, HCO3 in the last 72 hours.  Invalid input(s): PCO2, PO2  Studies/Results: Dg Chest 2 View  Result Date: 11/27/2017 CLINICAL DATA:  Abdominal pain and nausea since yesterday. EXAM: CHEST - 2 VIEW COMPARISON:  PA and lateral chest 07/21/2016.  CT chest 05/13/2005. FINDINGS: The lungs are clear. Heart size is normal. No pneumothorax or pleural fluid. No acute or focal bony abnormality. IMPRESSION: No acute disease. Electronically Signed   By: Inge Rise M.D.   On: 11/27/2017 14:43   Ct Abdomen Pelvis W Contrast  Result Date: 11/27/2017 CLINICAL DATA:  Abdominal pain. Left lower and left upper quadrant pain, onset yesterday. Nausea. EXAM: CT ABDOMEN AND PELVIS WITH CONTRAST TECHNIQUE: Multidetector CT imaging  of the abdomen and pelvis was performed using the standard protocol following bolus administration of intravenous contrast. CONTRAST:  188mL OMNIPAQUE IOHEXOL 300 MG/ML  SOLN COMPARISON:  Right upper quadrant ultrasound earlier this day. MRCP 06/26/2016. Abdominal CT 05/14/2015 FINDINGS: Lower chest: No consolidation or pleural fluid. Hepatobiliary: Recurrent intra and extrahepatic biliary ductal dilatation with common bile duct measuring 11 mm. Gallbladder physiologically distended with wall thickening and mild pericholecystic stranding. No calcified choledocholithiasis. No focal hepatic lesion. Pancreas: No ductal dilatation or inflammation. The previous questioned hypodense mass on CT in the pancreatic head is not seen. Spleen: Normal in size without focal abnormality. Adrenals/Urinary Tract: Normal adrenal glands. No hydronephrosis or perinephric edema. Homogeneous renal enhancement with symmetric excretion on delayed phase imaging. Urinary bladder is physiologically distended without wall thickening. Stomach/Bowel: Stomach is within normal limits. Appendix appears normal. No evidence of bowel wall thickening, distention, or inflammatory changes. Vascular/Lymphatic: Small left central mesenteric nodes with adjacent mesenteric edema, unchanged from prior exam. Aortic atherosclerosis without aneurysm. An 11 mm portal caval node is unchanged from prior and likely reactive. Reproductive: Enlarged prostate gland spanning 6.8 cm transverse. Other: No free air or ascites. Fat containing umbilical hernia with diastasis of the anterior abdominal wall musculature. Musculoskeletal: Degenerative change in both hips and throughout the lumbar spine. There are no acute or suspicious osseous abnormalities. IMPRESSION: 1. Recurrent intra and extrahepatic biliary ductal dilatation, common bile duct measures  11 mm. Mild gallbladder wall thickening with pericholecystic edema suggests acute gallbladder inflammation, no abnormal  gallbladder distention. MRCP or ERCP may be of value. 2. No other acute finding in the abdomen or pelvis. 3. Incidental findings of prostatomegaly and Aortic Atherosclerosis (ICD10-I70.0). Electronically Signed   By: Keith Rake M.D.   On: 11/27/2017 04:51   US Abdomen Limited Ruq  Result Date: 11/27/2017 CLINICAL DATA:  Right upper quadrant pain. EXAM: ULTRASOUND ABDOMEN LIMITED RIGHT UPPER QUADRANT COMPARISON:  MRCP 06/26/2016 FINDINGS: Technically limited exam due to body habitus. Gallbladder: Physiologically distended. Small stones and sludge in the gallbladder. Mild gallbladder wall thickening of 4 mm, wall thickness is not well assessed. No definite pericholecystic fluid. No sonographic Murphy sign noted by sonographer. Common bile duct: Diameter: 11 mm proximally. Liver: Diffusely increased and heterogeneous in echogenicity. Liver parenchyma is difficult to penetrate. Portal vein is patent on color Doppler imaging with normal direction of blood flow towards the liver. IMPRESSION: 1. Stones and sludge in the gallbladder with mild gallbladder wall thickening. No sonographic Murphy sign. 2. Mild biliary dilatation with increased common bile duct from prior MRCP, currently 11 mm, previously 6 mm. Recurrent choledocholithiasis is considered. Consider repeat MRCP if patient is able tolerate breath hold technique versus nuclear medicine HIDA scan. Electronically Signed   By: Keith Rake M.D.   On: 11/27/2017 04:19    Anti-infectives: Anti-infectives (From admission, onward)   Start     Dose/Rate Route Frequency Ordered Stop   11/28/17 0600  cefTRIAXone (ROCEPHIN) 2 g in sodium chloride 0.9 % 100 mL IVPB     2 g 200 mL/hr over 30 Minutes Intravenous Every 24 hours 11/27/17 1400     11/27/17 1200  metroNIDAZOLE (FLAGYL) IVPB 500 mg     500 mg 100 mL/hr over 60 Minutes Intravenous Every 8 hours 11/27/17 1047     11/27/17 0515  cefTRIAXone (ROCEPHIN) 1 g in sodium chloride 0.9 % 100 mL IVPB      1 g 200 mL/hr over 30 Minutes Intravenous  Once 11/27/17 0502 11/27/17 0554      Assessment/Plan: s/p Procedure(s): ENDOSCOPIC RETROGRADE CHOLANGIOPANCREATOGRAPHY (ERCP)  Acute cholecystitis, cholelitaiasis, choledocholithiasis likely S/p ERCP 2017 Elevated BMI  ERCP today per Dr. Hilarie Fredrickson. Tentatively preop for lap. Cholecystectomy tomorrow Labs in am. Procedure and risks discussed with patient   LOS: 0 days    Adin Hector 11/28/2017

## 2017-11-28 NOTE — Transfer of Care (Signed)
Immediate Anesthesia Transfer of Care Note  Patient: Ryan Roach  Procedure(s) Performed: ENDOSCOPIC RETROGRADE CHOLANGIOPANCREATOGRAPHY (ERCP) (N/A ) BILIARY DILATION REMOVAL OF STONES  Patient Location: PACU  Anesthesia Type:General  Level of Consciousness: awake, alert  and oriented  Airway & Oxygen Therapy: Patient Spontanous Breathing and Patient connected to face mask oxygen  Post-op Assessment: Report given to RN, Post -op Vital signs reviewed and stable and Patient moving all extremities X 4  Post vital signs: Reviewed and stable  Last Vitals:  Vitals Value Taken Time  BP 141/59 11/28/2017 10:41 AM  Temp 36.9 C 11/28/2017 10:40 AM  Pulse 73 11/28/2017 10:42 AM  Resp 16 11/28/2017 10:42 AM  SpO2 95 % 11/28/2017 10:42 AM  Vitals shown include unvalidated device data.  Last Pain:  Vitals:   11/28/17 1040  TempSrc: Oral  PainSc: 0-No pain      Patients Stated Pain Goal: 1 (46/50/35 4656)  Complications: No apparent anesthesia complications

## 2017-11-28 NOTE — Anesthesia Preprocedure Evaluation (Addendum)
Anesthesia Evaluation  Patient identified by MRN, date of birth, ID band Patient awake    Reviewed: Allergy & Precautions, H&P , NPO status , Patient's Chart, lab work & pertinent test results  History of Anesthesia Complications (+) PONV and history of anesthetic complications  Airway Mallampati: II   Neck ROM: full    Dental  (+) Upper Dentures, Lower Dentures, Dental Advidsory Given   Pulmonary neg pulmonary ROS,    breath sounds clear to auscultation       Cardiovascular Exercise Tolerance: Good negative cardio ROS   Rhythm:regular Rate:Normal     Neuro/Psych PSYCHIATRIC DISORDERS Anxiety negative neurological ROS     GI/Hepatic negative GI ROS, Neg liver ROS, Choledocholithiasis. Jaundice   Endo/Other  negative endocrine ROSMorbid obesity  Renal/GU negative Renal ROS  negative genitourinary   Musculoskeletal  (+) Arthritis ,   Abdominal   Peds  Hematology negative hematology ROS (+) Blood dyscrasia, anemia ,   Anesthesia Other Findings   Reproductive/Obstetrics negative OB ROS                            Anesthesia Physical  Anesthesia Plan  ASA: III  Anesthesia Plan: General   Post-op Pain Management:    Induction: Intravenous  PONV Risk Score and Plan: 1 and Ondansetron, Dexamethasone and Treatment may vary due to age or medical condition  Airway Management Planned: Oral ETT  Additional Equipment:   Intra-op Plan:   Post-operative Plan: Extubation in OR  Informed Consent: I have reviewed the patients History and Physical, chart, labs and discussed the procedure including the risks, benefits and alternatives for the proposed anesthesia with the patient or authorized representative who has indicated his/her understanding and acceptance.   Dental Advisory Given  Plan Discussed with: CRNA, Anesthesiologist and Surgeon  Anesthesia Plan Comments:          Anesthesia Quick Evaluation

## 2017-11-28 NOTE — Anesthesia Preprocedure Evaluation (Addendum)
Anesthesia Evaluation  Patient identified by MRN, date of birth, ID band Patient awake    Reviewed: Allergy & Precautions, H&P , NPO status , Patient's Chart, lab work & pertinent test results  History of Anesthesia Complications (+) PONV and history of anesthetic complications  Airway Mallampati: II   Neck ROM: full    Dental  (+) Upper Dentures, Lower Dentures, Dental Advidsory Given   Pulmonary neg pulmonary ROS,    breath sounds clear to auscultation       Cardiovascular negative cardio ROS   Rhythm:regular Rate:Normal     Neuro/Psych    GI/Hepatic Choledocholithiasis. Jaundice   Endo/Other  Morbid obesity  Renal/GU      Musculoskeletal  (+) Arthritis ,   Abdominal   Peds  Hematology   Anesthesia Other Findings   Reproductive/Obstetrics                            Anesthesia Physical Anesthesia Plan  ASA: III  Anesthesia Plan: General   Post-op Pain Management:    Induction: Intravenous  PONV Risk Score and Plan: Ondansetron, Dexamethasone and Treatment may vary due to age or medical condition  Airway Management Planned: Oral ETT  Additional Equipment:   Intra-op Plan:   Post-operative Plan: Extubation in OR  Informed Consent: I have reviewed the patients History and Physical, chart, labs and discussed the procedure including the risks, benefits and alternatives for the proposed anesthesia with the patient or authorized representative who has indicated his/her understanding and acceptance.   Dental Advisory Given  Plan Discussed with: CRNA, Anesthesiologist and Surgeon  Anesthesia Plan Comments:        Anesthesia Quick Evaluation

## 2017-11-28 NOTE — Interval H&P Note (Signed)
History and Physical Interval Note:  11/28/2017 9:00 AM  Ryan Roach  has presented today for surgery, with the diagnosis of Choledocholithiasis, Elevated LFT  The various methods of treatment have been discussed with the patient and family. After consideration of risks, benefits and other options for treatment, the patient has consented to  Procedure(s): ENDOSCOPIC RETROGRADE CHOLANGIOPANCREATOGRAPHY (ERCP) (N/A) as a surgical intervention .  The patient's history has been reviewed, patient examined, no change in status, stable for surgery.  I have reviewed the patient's chart and labs.  Questions were answered to the patient's satisfaction.    The risks of an ERCP were discussed at length, including but not limited to the risk of perforation, bleeding, abdominal pain, post-ERCP pancreatitis (while usually mild can be severe and even life threatening).  The risks and benefits of endoscopic evaluation were discussed with the patient; these include but are not limited to the risk of perforation, infection, bleeding, missed lesions, lack of diagnosis, severe illness requiring hospitalization, as well as anesthesia and sedation related illnesses.  The patient is agreeable to proceed.     [employer]

## 2017-11-28 NOTE — Op Note (Addendum)
[facility] Patient Name: Ryan Roach Procedure Date : 11/28/2017 MRN: [id number] Attending MD: Justice Britain , MD Date of Birth: 08/16/1945 CSN: [id number] Age: 72 Admit Type: Inpatient Procedure:                ERCP Indications:              Common bile duct stone(s), Abnormal abdominal CT,                            Jaundice, Abnormal liver function test, Prior                            Endoscopic Retrograde Cholangiopancreatography Providers:                Justice Britain, MD, Cleda Daub, RN,                            William Dalton, Technician Referring MD:             Lajuan Lines. Hilarie Fredrickson, MD, Margaree Mackintosh. Leticia Penna.                            Dalbert Batman, MD Medicines:                General Anesthesia, Indomethacin 100 mg PR,                            Antibiotics not given as he had already received                            CTX/Flagyl for the day Complications:            No immediate complications. Estimated Blood Loss:     Estimated blood loss was minimal. Procedure:                Pre-Anesthesia Assessment:                           - Prior to the procedure, a History and Physical                            was performed, and patient medications and                            allergies were reviewed. The patient's tolerance of                            previous anesthesia was also reviewed. The risks                            and benefits of the procedure and the sedation                            options and risks were discussed with the patient.  All questions were answered, and informed consent                            was obtained. Prior Anticoagulants: The patient has                            taken no previous anticoagulant or antiplatelet                            agents. ASA Grade Assessment: II - A patient with                            mild systemic disease. After reviewing the risks                             and benefits, the patient was deemed in                            satisfactory condition to undergo the procedure.                           After obtaining informed consent, the scope was                            passed under direct vision. Throughout the                            procedure, the patient's blood pressure, pulse, and                            oxygen saturations were monitored continuously. The                            [id number]-Q180V ([id number]) Olympus ERCP was introduced                            through the mouth, and used to inject contrast into                            and used to inject contrast into the bile duct. The                            ERCP was accomplished without difficulty. The                            patient tolerated the procedure. Fluroscopic                            imaging on Canopy. Scope In: Scope Out: Findings:      The scout film was normal.      The esophagus was successfully intubated under direct vision without       detailed examination of the pharynx, larynx, and associated structures,       and upper GI tract.  A biliary sphincterotomy had been performed       previously. The sphincterotomy appeared open though was small.      A short 0.035 inch Soft Jagwire was passed into the biliary tree. The       short-nosed traction sphincterotome was passed over the guidewire and       the bile duct was then deeply cannulated. Contrast was injected. I       personally interpreted the bile duct images. Ductal flow of contrast was       adequate. Image quality was adequate. Contrast extended to the hepatic       ducts. Opacification of the entire biliary tree except for the cystic       duct and gallbladder was successful. The main bile duct was moderately       dilated. The largest diameter was 13 mm. The lower third of the main       duct contained filling defects thought to be a stone and sludge. The       biliary tree was swept with  a retrieval balloon starting at the       bifurcation. Pus was swept from the duct. Sludge was swept from the       duct. One stone was removed in fragments. No stones remained but concern       for possible debris led to decision to proceed with sphincteroplasty.       The lower third of the main bile duct and bile duct orifice were       successfully dilated with an 09-16-08 mm balloon (to a maximum balloon       size of 9 mm) dilator for 4-minutes. To discover objects, the biliary       tree was swept with a retrieval balloon starting at the bifurcation. Pus       was swept from the duct. No further stone fragments or debris was found.       An occlusion cholangiogram was performed that showed no further       significant biliary pathology. The cystic duct did not fill upon       occlusion.      A pancreatogram was not performed.      The duodenoscope was withdrawn from the patient. Impression:               - Prior biliary sphincterotomy appeared small but                            open.                           - A filling defect consistent with a stone and                            sludge was seen on the cholangiogram                            fluroscopically.                           - The entire main bile duct was moderately dilated.                           -  Choledocholithiasis and pus was found. Complete                            removal was accomplished by balloon dilation via                            sphincteroplasty and balloon sweeping.                           - Occlusion Cholangiogram showed no evidence of                            further biliary stone/sludge debris material.                           - The cystic duct did not fill concerning for                            possible concomittant cystic duct                            obstruction/cholecystitis. Recommendation:           - The patient will be observed post-procedure,                             until all discharge criteria are met.                           - Return patient to hospital ward for ongoing care.                           - Check liver enzymes (AST, ALT, alkaline                            phosphatase, bilirubin) in the morning.                           - Observe patient's clinical course.                           - Watch for pancreatitis, bleeding, perforation,                            and cholangitis.                           - Would continue antibiotics for at least 5-7 days                            in setting of cholangitis or more if patient were                            to be found to be bacteremic. Ciprofloxacin 500 mg  BID probably OK unless sensitivites suggest other                            antibiotic needs.                           - Proceed with cholecystectomy as definitive                            therapy for recurrent choledocholithiasis as per                            surgical service timing.                           - The findings and recommendations were discussed                            with the patient.                           - The findings and recommendations were discussed                            with the referring physician. Procedure Code(s):        --- Professional ---                           [phone], 17, Endoscopic retrograde                            cholangiopancreatography (ERCP); with                            trans-endoscopic balloon dilation of                            biliary/pancreatic duct(s) or of ampulla                            (sphincteroplasty), including sphincterotomy, when                            performed, each duct                           [id number], Endoscopic retrograde                            cholangiopancreatography (ERCP); with removal of                            calculi/debris from biliary/pancreatic duct(s) Diagnosis Code(s):        --- Professional  ---                           K80.50, Calculus of bile duct without cholangitis  or cholecystitis without obstruction                           R17, Unspecified jaundice                           R94.5, Abnormal results of liver function studies                           Z98.890, Other specified postprocedural states                           K83.8, Other specified diseases of biliary tract                           R93.5, Abnormal findings on diagnostic imaging of                            other abdominal regions, including retroperitoneum                           R93.2, Abnormal findings on diagnostic imaging of                            liver and biliary tract CPT copyright 2018 [employer]. All rights reserved. The codes documented in this report are preliminary and upon coder review may  be revised to meet current compliance requirements. Justice Britain, MD 11/28/2017 10:45:25 AM Number of Addenda: 0

## 2017-11-29 ENCOUNTER — Inpatient Hospital Stay (HOSPITAL_COMMUNITY): Payer: Medicare HMO | Admitting: Certified Registered Nurse Anesthetist

## 2017-11-29 ENCOUNTER — Encounter (HOSPITAL_COMMUNITY)
Admission: EM | Disposition: A | Discharge status: Home Health | Payer: Self-pay | Source: Home / Self Care | Attending: Internal Medicine

## 2017-11-29 HISTORY — PX: CHOLECYSTECTOMY: SHX55

## 2017-11-29 LAB — CBC
HCT: 38.7 % — ABNORMAL LOW (ref 39.0–52.0)
Hemoglobin: 11.9 g/dL — ABNORMAL LOW (ref 13.0–17.0)
MCH: 28.1 pg (ref 26.0–34.0)
MCHC: 30.7 g/dL (ref 30.0–36.0)
MCV: 91.5 fL (ref 80.0–100.0)
PLATELETS: 273 10*3/uL (ref 150–400)
RBC: 4.23 MIL/uL (ref 4.22–5.81)
RDW: 12.7 % (ref 11.5–15.5)
WBC: 8.4 10*3/uL (ref 4.0–10.5)
nRBC: 0 % (ref 0.0–0.2)

## 2017-11-29 LAB — COMPREHENSIVE METABOLIC PANEL
ALT: 122 U/L — AB (ref 0–44)
AST: 120 U/L — ABNORMAL HIGH (ref 15–41)
Albumin: 2.5 g/dL — ABNORMAL LOW (ref 3.5–5.0)
Alkaline Phosphatase: 174 U/L — ABNORMAL HIGH (ref 38–126)
Anion gap: 7 (ref 5–15)
BILIRUBIN TOTAL: 1.7 mg/dL — AB (ref 0.3–1.2)
BUN: 14 mg/dL (ref 8–23)
CO2: 26 mmol/L (ref 22–32)
CREATININE: 0.72 mg/dL (ref 0.61–1.24)
Calcium: 8.3 mg/dL — ABNORMAL LOW (ref 8.9–10.3)
Chloride: 109 mmol/L (ref 98–111)
GFR calc Af Amer: >60 mL/min (ref >60)
Glucose, Bld: 139 mg/dL — ABNORMAL HIGH (ref 70–99)
Potassium: 5.2 mmol/L — ABNORMAL HIGH (ref 3.5–5.1)
SODIUM: 142 mmol/L (ref 135–145)
TOTAL PROTEIN: 6.5 g/dL (ref 6.5–8.1)

## 2017-11-29 LAB — AMYLASE: AMYLASE: 56 U/L (ref 28–100)

## 2017-11-29 LAB — HEPATITIS PANEL, ACUTE
HCV Ab: 0.1 s/co ratio (ref 0.0–0.9)
HEP B C IGM: NEGATIVE
HEP B S AG: NEGATIVE
Hep A IgM: NEGATIVE

## 2017-11-29 LAB — MAGNESIUM: MAGNESIUM: 2.1 mg/dL (ref 1.7–2.4)

## 2017-11-29 LAB — POTASSIUM: POTASSIUM: 3.7 mmol/L (ref 3.5–5.1)

## 2017-11-29 SURGERY — LAPAROSCOPIC CHOLECYSTECTOMY
Anesthesia: General | Site: Abdomen

## 2017-11-29 MED ORDER — LACTATED RINGERS IV SOLN: INTRAVENOUS | Status: DC

## 2017-11-29 MED ORDER — FENTANYL CITRATE (PF) 250 MCG/5ML IJ SOLN
INTRAMUSCULAR | Status: DC | PRN
  Administered 2017-11-29 (×2): 50 ug via INTRAVENOUS
  Administered 2017-11-29: 100 ug via INTRAVENOUS
  Administered 2017-11-29: 50 ug via INTRAVENOUS

## 2017-11-29 MED ORDER — LACTATED RINGERS IV SOLN
INTRAVENOUS | Status: DC | PRN
  Administered 2017-11-29 (×2): via INTRAVENOUS

## 2017-11-29 MED ORDER — BISACODYL 5 MG PO TBEC
10.0000 mg | DELAYED_RELEASE_TABLET | Freq: Once | ORAL | Status: AC
  Administered 2017-11-29: 10 mg via ORAL
  Filled 2017-11-29: qty 2

## 2017-11-29 MED ORDER — LACTATED RINGERS IV SOLN
INTRAVENOUS | Status: AC
  Administered 2017-11-29: 15:00:00 via INTRAVENOUS

## 2017-11-29 MED ORDER — SUGAMMADEX SODIUM 500 MG/5ML IV SOLN
INTRAVENOUS | Status: DC | PRN
  Administered 2017-11-29: 400 mg via INTRAVENOUS

## 2017-11-29 MED ORDER — PROPOFOL 10 MG/ML IV BOLUS
INTRAVENOUS | Status: DC | PRN
  Administered 2017-11-29: 200 mg via INTRAVENOUS

## 2017-11-29 MED ORDER — PROPOFOL 10 MG/ML IV BOLUS
INTRAVENOUS | Status: AC
  Filled 2017-11-29: qty 20

## 2017-11-29 MED ORDER — IOPAMIDOL (ISOVUE-300) INJECTION 61%
INTRAVENOUS | Status: AC
  Filled 2017-11-29: qty 50

## 2017-11-29 MED ORDER — DEXAMETHASONE SODIUM PHOSPHATE 10 MG/ML IJ SOLN
INTRAMUSCULAR | Status: DC | PRN
  Administered 2017-11-29: 10 mg via INTRAVENOUS

## 2017-11-29 MED ORDER — MORPHINE SULFATE (PF) 2 MG/ML IV SOLN
2.0000 mg | INTRAVENOUS | Status: DC | PRN
  Administered 2017-11-29 (×5): 2 mg via INTRAVENOUS
  Filled 2017-11-29 (×5): qty 1

## 2017-11-29 MED ORDER — FENTANYL CITRATE (PF) 100 MCG/2ML IJ SOLN
25.0000 ug | INTRAMUSCULAR | Status: DC | PRN
  Administered 2017-11-29: 50 ug via INTRAVENOUS

## 2017-11-29 MED ORDER — ROCURONIUM BROMIDE 50 MG/5ML IV SOSY
PREFILLED_SYRINGE | INTRAVENOUS | Status: AC
  Filled 2017-11-29: qty 5

## 2017-11-29 MED ORDER — DEXAMETHASONE SODIUM PHOSPHATE 10 MG/ML IJ SOLN
INTRAMUSCULAR | Status: AC
  Filled 2017-11-29: qty 1

## 2017-11-29 MED ORDER — 0.9 % SODIUM CHLORIDE (POUR BTL) OPTIME
TOPICAL | Status: DC | PRN
  Administered 2017-11-29: 1000 mL

## 2017-11-29 MED ORDER — MIDAZOLAM HCL 2 MG/2ML IJ SOLN
INTRAMUSCULAR | Status: DC | PRN
  Administered 2017-11-29: 2 mg via INTRAVENOUS

## 2017-11-29 MED ORDER — SODIUM CHLORIDE 0.9 % IV BOLUS
250.0000 mL | Freq: Once | INTRAVENOUS | Status: AC
  Administered 2017-11-29: 250 mL via INTRAVENOUS

## 2017-11-29 MED ORDER — LIDOCAINE 2% (20 MG/ML) 5 ML SYRINGE
INTRAMUSCULAR | Status: DC | PRN
  Administered 2017-11-29: 100 mg via INTRAVENOUS

## 2017-11-29 MED ORDER — BUPIVACAINE-EPINEPHRINE (PF) 0.25% -1:200000 IJ SOLN
INTRAMUSCULAR | Status: AC
  Filled 2017-11-29: qty 30

## 2017-11-29 MED ORDER — SODIUM CHLORIDE 0.9 % IR SOLN
Status: DC | PRN
  Administered 2017-11-29: 1000 mL

## 2017-11-29 MED ORDER — ONDANSETRON HCL 4 MG/2ML IJ SOLN
INTRAMUSCULAR | Status: DC | PRN
  Administered 2017-11-29: 4 mg via INTRAVENOUS

## 2017-11-29 MED ORDER — ONDANSETRON HCL 4 MG/2ML IJ SOLN
INTRAMUSCULAR | Status: AC
  Filled 2017-11-29: qty 2

## 2017-11-29 MED ORDER — FENTANYL CITRATE (PF) 100 MCG/2ML IJ SOLN
INTRAMUSCULAR | Status: AC
  Filled 2017-11-29: qty 2

## 2017-11-29 MED ORDER — HEMOSTATIC AGENTS (NO CHARGE) OPTIME
TOPICAL | Status: DC | PRN
  Administered 2017-11-29: 1 via TOPICAL

## 2017-11-29 MED ORDER — MORPHINE SULFATE (PF) 2 MG/ML IV SOLN
INTRAVENOUS | Status: AC
  Administered 2017-11-29: 2 mg via INTRAVENOUS
  Filled 2017-11-29: qty 1

## 2017-11-29 MED ORDER — FENTANYL CITRATE (PF) 250 MCG/5ML IJ SOLN
INTRAMUSCULAR | Status: AC
  Filled 2017-11-29: qty 5

## 2017-11-29 MED ORDER — BUPIVACAINE-EPINEPHRINE 0.25% -1:200000 IJ SOLN
INTRAMUSCULAR | Status: DC | PRN
  Administered 2017-11-29: 20 mL

## 2017-11-29 MED ORDER — ROCURONIUM BROMIDE 10 MG/ML (PF) SYRINGE
PREFILLED_SYRINGE | INTRAVENOUS | Status: DC | PRN
  Administered 2017-11-29: 20 mg via INTRAVENOUS
  Administered 2017-11-29: 50 mg via INTRAVENOUS
  Administered 2017-11-29: 20 mg via INTRAVENOUS

## 2017-11-29 MED ORDER — LIDOCAINE 2% (20 MG/ML) 5 ML SYRINGE
INTRAMUSCULAR | Status: AC
  Filled 2017-11-29: qty 5

## 2017-11-29 MED ORDER — MEPERIDINE HCL 50 MG/ML IJ SOLN: 6.2500 mg | INTRAMUSCULAR | Status: DC | PRN

## 2017-11-29 MED ORDER — MIDAZOLAM HCL 2 MG/2ML IJ SOLN
INTRAMUSCULAR | Status: AC
  Filled 2017-11-29: qty 2

## 2017-11-29 MED ORDER — TRAMADOL HCL 50 MG PO TABS
ORAL_TABLET | ORAL | Status: AC
  Filled 2017-11-29: qty 2

## 2017-11-29 MED ORDER — TRAMADOL HCL 50 MG PO TABS
100.0000 mg | ORAL_TABLET | Freq: Four times a day (QID) | ORAL | Status: DC | PRN
  Administered 2017-11-29 – 2017-11-30 (×2): 100 mg via ORAL
  Filled 2017-11-29: qty 2

## 2017-11-29 MED ORDER — FUROSEMIDE 10 MG/ML IJ SOLN
20.0000 mg | Freq: Once | INTRAMUSCULAR | Status: AC
  Administered 2017-11-29: 20 mg via INTRAVENOUS
  Filled 2017-11-29: qty 2

## 2017-11-29 SURGICAL SUPPLY — 39 items
APPLIER CLIP 5 13 M/L LIGAMAX5 (MISCELLANEOUS) ×4
BLADE CLIPPER SURG (BLADE) ×4 IMPLANT
CANISTER SUCT 3000ML PPV (MISCELLANEOUS) ×4 IMPLANT
CHLORAPREP W/TINT 26ML (MISCELLANEOUS) ×4 IMPLANT
CLIP APPLIE 5 13 M/L LIGAMAX5 (MISCELLANEOUS) ×2 IMPLANT
COVER MAYO STAND STRL (DRAPES) ×4 IMPLANT
COVER SURGICAL LIGHT HANDLE (MISCELLANEOUS) ×4 IMPLANT
COVER WAND RF STERILE (DRAPES) IMPLANT
DERMABOND ADVANCED (GAUZE/BANDAGES/DRESSINGS) ×2
DERMABOND ADVANCED .7 DNX12 (GAUZE/BANDAGES/DRESSINGS) ×2 IMPLANT
DRAPE C-ARM 42X72 X-RAY (DRAPES) IMPLANT
ELECT REM PT RETURN 9FT ADLT (ELECTROSURGICAL) ×4
ELECTRODE REM PT RTRN 9FT ADLT (ELECTROSURGICAL) ×2 IMPLANT
GLOVE SURG SIGNA 7.5 PF LTX (GLOVE) ×4 IMPLANT
GOWN STRL REUS W/ TWL LRG LVL3 (GOWN DISPOSABLE) ×4 IMPLANT
GOWN STRL REUS W/ TWL XL LVL3 (GOWN DISPOSABLE) ×2 IMPLANT
GOWN STRL REUS W/TWL LRG LVL3 (GOWN DISPOSABLE) ×4
GOWN STRL REUS W/TWL XL LVL3 (GOWN DISPOSABLE) ×2
HEMOSTAT SNOW SURGICEL 2X4 (HEMOSTASIS) ×4 IMPLANT
HOVERMATT SINGLE USE (MISCELLANEOUS) ×4 IMPLANT
KIT BASIN OR (CUSTOM PROCEDURE TRAY) ×4 IMPLANT
KIT TURNOVER KIT B (KITS) ×4 IMPLANT
NS IRRIG 1000ML POUR BTL (IV SOLUTION) ×4 IMPLANT
PAD ARMBOARD 7.5X6 YLW CONV (MISCELLANEOUS) ×4 IMPLANT
POUCH SPECIMEN RETRIEVAL 10MM (ENDOMECHANICALS) ×4 IMPLANT
SCISSORS LAP 5X35 DISP (ENDOMECHANICALS) ×4 IMPLANT
SET CHOLANGIOGRAPH 5 50 .035 (SET/KITS/TRAYS/PACK) IMPLANT
SET IRRIG TUBING LAPAROSCOPIC (IRRIGATION / IRRIGATOR) ×4 IMPLANT
SLEEVE ENDOPATH XCEL 5M (ENDOMECHANICALS) ×12 IMPLANT
SPECIMEN JAR SMALL (MISCELLANEOUS) ×4 IMPLANT
SUT MNCRL AB 4-0 PS2 18 (SUTURE) ×4 IMPLANT
SUT VICRYL 0 UR6 27IN ABS (SUTURE) ×4 IMPLANT
TOWEL OR 17X24 6PK STRL BLUE (TOWEL DISPOSABLE) ×4 IMPLANT
TOWEL OR 17X26 10 PK STRL BLUE (TOWEL DISPOSABLE) ×4 IMPLANT
TRAY LAPAROSCOPIC MC (CUSTOM PROCEDURE TRAY) ×4 IMPLANT
TROCAR XCEL BLUNT TIP 100MML (ENDOMECHANICALS) ×4 IMPLANT
TROCAR XCEL NON-BLD 5MMX100MML (ENDOMECHANICALS) ×4 IMPLANT
TUBING INSUFFLATION (TUBING) ×4 IMPLANT
WATER STERILE IRR 1000ML POUR (IV SOLUTION) ×4 IMPLANT

## 2017-11-29 NOTE — Progress Notes (Signed)
Patient transferred to OR for procedure. No c/o pain or discomfort. No s/s of distress. Consent signed. Report called to [facility].

## 2017-11-29 NOTE — Anesthesia Procedure Notes (Signed)
Procedure Name: Intubation Date/Time: 11/29/2017 7:44 AM Performed by: Bryson Corona, CRNA Pre-anesthesia Checklist: Patient identified, Emergency Drugs available, Suction available and Patient being monitored Patient Re-evaluated:Patient Re-evaluated prior to induction Oxygen Delivery Method: Circle System Utilized Preoxygenation: Pre-oxygenation with 100% oxygen Induction Type: IV induction Ventilation: Oral airway inserted - appropriate to patient size Laryngoscope Size: Mac and 4 Grade View: Grade I Tube type: Oral Tube size: 7.0 mm Number of attempts: 1 Airway Equipment and Method: Stylet and Oral airway Placement Confirmation: ETT inserted through vocal cords under direct vision,  positive ETCO2 and breath sounds checked- equal and bilateral Secured at: 22 cm Tube secured with: Tape Dental Injury: Teeth and Oropharynx as per pre-operative assessment

## 2017-11-29 NOTE — Progress Notes (Signed)
PROGRESS NOTE                                                                                                                                                                                                             Patient Demographics:    Ryan Roach, is a 72 y.o. male, DOB - [birth year], [place] date - 11/27/2017   Admitting Physician Jani Gravel, MD  Outpatient Primary MD for the patient is Ryan Lennert, MD  LOS - 1  Chief Complaint  Patient presents with  . Abdominal Pain       Brief Narrative  Ryan Roach  is a 72 y.o. male, w h/o ERCP w sphincterotomy for cholelithiasis in 2017 apparently presents with c/o nausea over the past 1 week. Pt noted yesterday RUQ / epigastric pain, sharp.  Was admitted to the hospital for abdominal pain work-up suggestive of possible dilation and CBD along with possible acute cholecystitis.    Subjective:   Patient in bed, in no distress just back from cholecystectomy, mild postop right upper quadrant abdominal pain, no chest pain or shortness of breath no focal weakness.   Assessment  & Plan :     1.  Right upper quadrant abdominal pain.  Is due to acute cholecystitis with CBD stone.  Seen by GI and general surgery, underwent ERCP with CBD stone removal on 11/28/2017, being maintained on IV Rocephin and Flagyl, underwent lap chole on 11/29/2017, reportedly the bladder was friable and there was one enlarged lymph node which was removed as well.  Advance diet gradually, gentle IV fluids, added IS.  Increase activity, will defer to general surgery on any continued need for antibiotics. If stable discharge tomorrow.  2.  Hyperkalemia.  Overzealous replacement, gentle Lasix x1 and repeat potassium this evening and tomorrow.  3.  Morbid obesity.  Follow with PCP for weight loss.  4.  Chronic diastolic CHF last EF around 50% on echocardiogram in 2017.  Compensated continue to monitor.  5.  Anxiety and depression.  On Celexa,  resume once GI issues are better.     Family Communication  :  Wife on 11/27/2017 & 11/29/17  Code Status :  Full  Disposition Plan  :  Med  Consults  :  GI, CCS  Procedures  :    ERCP - CBD stone removed - 11/28/17  Lap chole - done 11/29/17 - friable goal bladder with an enlarged lymph node removed   CT - 1. Recurrent intra and  extrahepatic biliary ductal dilatation, common bile duct measures 11 mm. Mild gallbladder wall thickening with pericholecystic edema suggests acute gallbladder inflammation, no abnormal gallbladder distention. MRCP or ERCP may be of value. 2. No other acute finding in the abdomen or pelvis. 3. Incidental findings of prostatomegaly and Aortic Atherosclerosis (ICD10-I70.0)  DVT Prophylaxis  :  Heparin   Lab Results  Component Value Date   PLT 273 11/29/2017    Diet :  Diet Order            Diet clear liquid Room service appropriate? Yes; Fluid consistency: Thin  Diet effective now               Inpatient Medications Scheduled Meds: . citalopram  20 mg Oral Daily  . fentaNYL      . gabapentin  300 mg Oral On Call to OR  . traMADol       Continuous Infusions: . cefTRIAXone (ROCEPHIN)  IV 2 g (11/29/17 0641)  . famotidine (PEPCID) IV 20 mg (11/29/17 1028)  . lactated ringers    . metronidazole 500 mg (11/29/17 0520)   PRN Meds:.hydrALAZINE, metoprolol tartrate, morphine injection, ondansetron (ZOFRAN) IV, traMADol  Antibiotics  :   Anti-infectives (From admission, onward)   Start     Dose/Rate Route Frequency Ordered Stop   11/29/17 0700  ceFAZolin (ANCEF) 3 g in dextrose 5 % 50 mL IVPB  Status:  Discontinued     3 g 100 mL/hr over 30 Minutes Intravenous To ShortStay Surgical 11/28/17 1941 11/29/17 0943   11/29/17 0600  ceFAZolin (ANCEF) 3 g in dextrose 5 % 50 mL IVPB  Status:  Discontinued     3 g 100 mL/hr over 30 Minutes Intravenous On call to O.R. 11/28/17 1941 11/28/17 1958   11/28/17 0600  cefTRIAXone (ROCEPHIN) 2 g in sodium  chloride 0.9 % 100 mL IVPB     2 g 200 mL/hr over 30 Minutes Intravenous Every 24 hours 11/27/17 1400     11/27/17 1200  metroNIDAZOLE (FLAGYL) IVPB 500 mg     500 mg 100 mL/hr over 60 Minutes Intravenous Every 8 hours 11/27/17 1047     11/27/17 0515  cefTRIAXone (ROCEPHIN) 1 g in sodium chloride 0.9 % 100 mL IVPB     1 g 200 mL/hr over 30 Minutes Intravenous  Once 11/27/17 0502 11/27/17 0554        Objective:   Vitals:   11/29/17 0608 11/29/17 0856 11/29/17 0915 11/29/17 0930  BP: 130/66 (!) 141/58 (!) 165/84   Pulse: (!) 59 87 87 83  Resp: 17 18 15 18   Temp: 97.8 F (36.6 C) 98.1 F (36.7 C)  97.9 F (36.6 C)  TempSrc: Oral     SpO2: 96% 97% 95% 95%  Weight:      Height:        Wt Readings from Last 3 Encounters:  11/27/17 (!) 145.2 kg  12/25/15 (!) 142 kg  12/03/15 (!) 142 kg     Intake/Output Summary (Last 24 hours) at 11/29/2017 1105 Last data filed at 11/29/2017 0900 Gross per 24 hour  Intake 3348.6 ml  Output 630 ml  Net 2718.6 ml     Physical Exam  Awake Alert, Oriented X 3, No new F.N deficits, Normal affect [place].AT,PERRAL Supple Neck,No JVD, No cervical lymphadenopathy appriciated.  Symmetrical Chest wall movement, Good air movement bilaterally, CTAB RRR,No Gallops, Rubs or new Murmurs, No Parasternal Heave Hypoactive but +ve B.Sounds, Abd Soft, No tenderness, No organomegaly appriciated, No rebound -  guarding or rigidity. No Cyanosis, Clubbing or edema, No new Rash or bruise    Data Review:    CBC Recent Labs  Lab 11/27/17 0058 11/28/17 0537 11/29/17 0348  WBC 10.2 8.4 8.4  HGB 14.3 12.1* 11.9*  HCT 46.7 38.7* 38.7*  PLT 370 288 273  MCV 90.9 91.3 91.5  MCH 27.8 28.5 28.1  MCHC 30.6 31.3 30.7  RDW 12.9 12.9 12.7    Chemistries  Recent Labs  Lab 11/27/17 0058 11/27/17 0723 11/28/17 0537 11/29/17 0348  NA 139 140 139 142  K 3.2* 3.4* 3.2* 5.2*  CL 104 104 105 109  CO2 26 26 25 26   GLUCOSE 128* 118* 96 139*  BUN 18 15 10 14     CREATININE 0.76 0.73 0.64 0.72  CALCIUM 8.7* 8.3* 7.8* 8.3*  MG  --  1.9  --  2.1  AST 238* 252* 165* 120*  ALT 123* 142* 136* 122*  ALKPHOS 167* 163* 160* 174*  BILITOT 2.5* 3.4* 5.3* 1.7*   ------------------------------------------------------------------------------------------------------------------ No results for input(s): CHOL, HDL, LDLCALC, TRIG, CHOLHDL, LDLDIRECT in the last 72 hours.  No results found for: HGBA1C ------------------------------------------------------------------------------------------------------------------ No results for input(s): TSH, T4TOTAL, T3FREE, THYROIDAB in the last 72 hours.  Invalid input(s): FREET3 ------------------------------------------------------------------------------------------------------------------ No results for input(s): VITAMINB12, FOLATE, FERRITIN, TIBC, IRON, RETICCTPCT in the last 72 hours.  Coagulation profile Recent Labs  Lab 11/27/17 1136  INR 1.18    No results for input(s): DDIMER in the last 72 hours.  Cardiac Enzymes No results for input(s): CKMB, TROPONINI, MYOGLOBIN in the last 168 hours.  Invalid input(s): CK ------------------------------------------------------------------------------------------------------------------ No results found for: BNP  Micro Results Recent Results (from the past 240 hour(s))  Surgical pcr screen     Status: None   Collection Time: 11/28/17  5:25 AM  Result Value Ref Range Status   MRSA, PCR NEGATIVE NEGATIVE Final   Staphylococcus aureus NEGATIVE NEGATIVE Final    Comment: (NOTE) The Xpert SA Assay (FDA approved for NASAL specimens in patients 76 years of age and older), is one component of a comprehensive surveillance program. It is not intended to diagnose infection nor to guide or monitor treatment. Performed at [facility], [place] [address], [place], [place] [address]     Radiology Reports Dg Chest 2 View  Result Date: 11/27/2017 CLINICAL DATA:   Abdominal pain and nausea since yesterday. EXAM: CHEST - 2 VIEW COMPARISON:  PA and lateral chest 07/21/2016.  CT chest 05/13/2005. FINDINGS: The lungs are clear. Heart size is normal. No pneumothorax or pleural fluid. No acute or focal bony abnormality. IMPRESSION: No acute disease. Electronically Signed   By: Inge Rise M.D.   On: 11/27/2017 14:43   Ct Abdomen Pelvis W Contrast  Result Date: 11/27/2017 CLINICAL DATA:  Abdominal pain. Left lower and left upper quadrant pain, onset yesterday. Nausea. EXAM: CT ABDOMEN AND PELVIS WITH CONTRAST TECHNIQUE: Multidetector CT imaging of the abdomen and pelvis was performed using the standard protocol following bolus administration of intravenous contrast. CONTRAST:  152mL OMNIPAQUE IOHEXOL 300 MG/ML  SOLN COMPARISON:  Right upper quadrant ultrasound earlier this day. MRCP 06/26/2016. Abdominal CT 05/14/2015 FINDINGS: Lower chest: No consolidation or pleural fluid. Hepatobiliary: Recurrent intra and extrahepatic biliary ductal dilatation with common bile duct measuring 11 mm. Gallbladder physiologically distended with wall thickening and mild pericholecystic stranding. No calcified choledocholithiasis. No focal hepatic lesion. Pancreas: No ductal dilatation or inflammation. The previous questioned hypodense mass on CT in the pancreatic head is not seen.  Spleen: Normal in size without focal abnormality. Adrenals/Urinary Tract: Normal adrenal glands. No hydronephrosis or perinephric edema. Homogeneous renal enhancement with symmetric excretion on delayed phase imaging. Urinary bladder is physiologically distended without wall thickening. Stomach/Bowel: Stomach is within normal limits. Appendix appears normal. No evidence of bowel wall thickening, distention, or inflammatory changes. Vascular/Lymphatic: Small left central mesenteric nodes with adjacent mesenteric edema, unchanged from prior exam. Aortic atherosclerosis without aneurysm. An 11 mm portal caval node is  unchanged from prior and likely reactive. Reproductive: Enlarged prostate gland spanning 6.8 cm transverse. Other: No free air or ascites. Fat containing umbilical hernia with diastasis of the anterior abdominal wall musculature. Musculoskeletal: Degenerative change in both hips and throughout the lumbar spine. There are no acute or suspicious osseous abnormalities. IMPRESSION: 1. Recurrent intra and extrahepatic biliary ductal dilatation, common bile duct measures 11 mm. Mild gallbladder wall thickening with pericholecystic edema suggests acute gallbladder inflammation, no abnormal gallbladder distention. MRCP or ERCP may be of value. 2. No other acute finding in the abdomen or pelvis. 3. Incidental findings of prostatomegaly and Aortic Atherosclerosis (ICD10-I70.0). Electronically Signed   By: Keith Rake M.D.   On: 11/27/2017 04:51   Dg Ercp  Result Date: 11/28/2017 CLINICAL DATA:  Biliary dilatation and choledocholithiasis. EXAM: ERCP TECHNIQUE: Multiple spot images obtained with the fluoroscopic device and submitted for interpretation post-procedure. COMPARISON:  CT of the abdomen on 11/27/2017 FINDINGS: Imaging during ERCP with a C-arm demonstrates cannulation of the common bile duct with cholangiogram demonstrating irregular filling defects in a dilated common bile duct. A balloon sweep maneuver was performed to extract calculi. Completion cholangiogram shows a normally patent common bile duct without evidence of filling defects. IMPRESSION: Evidence of choledocholithiasis with filling defects in the common bile duct. Balloon sweep extraction of calculi was performed. These images were submitted for radiologic interpretation only. Please see the procedural report for the amount of contrast and the fluoroscopy time utilized. Electronically Signed   By: Aletta Edouard M.D.   On: 11/28/2017 10:47   US Abdomen Limited Ruq  Result Date: 11/27/2017 CLINICAL DATA:  Right upper quadrant pain. EXAM:  ULTRASOUND ABDOMEN LIMITED RIGHT UPPER QUADRANT COMPARISON:  MRCP 06/26/2016 FINDINGS: Technically limited exam due to body habitus. Gallbladder: Physiologically distended. Small stones and sludge in the gallbladder. Mild gallbladder wall thickening of 4 mm, wall thickness is not well assessed. No definite pericholecystic fluid. No sonographic Murphy sign noted by sonographer. Common bile duct: Diameter: 11 mm proximally. Liver: Diffusely increased and heterogeneous in echogenicity. Liver parenchyma is difficult to penetrate. Portal vein is patent on color Doppler imaging with normal direction of blood flow towards the liver. IMPRESSION: 1. Stones and sludge in the gallbladder with mild gallbladder wall thickening. No sonographic Murphy sign. 2. Mild biliary dilatation with increased common bile duct from prior MRCP, currently 11 mm, previously 6 mm. Recurrent choledocholithiasis is considered. Consider repeat MRCP if patient is able tolerate breath hold technique versus nuclear medicine HIDA scan. Electronically Signed   By: Keith Rake M.D.   On: 11/27/2017 04:19    Time Spent in minutes  30   Lala Lund M.D on 11/29/2017 at 11:05 AM  To page go to [email or web address] - password [facility]

## 2017-11-29 NOTE — Progress Notes (Signed)
Patient ID: Ryan Roach, male   DOB: 09-27-1945, 72 y.o.   MRN: [id number]   Pre Procedure note for inpatients:   Ryan Roach has been scheduled for Procedure(s): LAPAROSCOPIC CHOLECYSTECTOMY WITH INTRAOPERATIVE CHOLANGIOGRAM (N/A) today. The various methods of treatment have been discussed with the patient. After consideration of the risks, benefits and treatment options the patient has consented to the planned procedure.   The patient has been seen and labs reviewed. There are no changes in the patient's condition to prevent proceeding with the planned procedure today.  Recent labs:  Lab Results  Component Value Date   WBC 8.4 11/29/2017   HGB 11.9 (L) 11/29/2017   HCT 38.7 (L) 11/29/2017   PLT 273 11/29/2017   GLUCOSE 139 (H) 11/29/2017   ALT 122 (H) 11/29/2017   AST 120 (H) 11/29/2017   NA 142 11/29/2017   K 5.2 (H) 11/29/2017   CL 109 11/29/2017   CREATININE 0.72 11/29/2017   BUN 14 11/29/2017   CO2 26 11/29/2017   PSA 5.98 (H) 05/14/2015   INR 1.18 11/27/2017    Tkai Serfass A, MD 11/29/2017 6:53 AM

## 2017-11-29 NOTE — Anesthesia Postprocedure Evaluation (Signed)
Anesthesia Post Note  Patient: Ryan Roach  Procedure(s) Performed: LAPAROSCOPIC CHOLECYSTECTOMY (N/A Abdomen)     Patient location during evaluation: PACU Anesthesia Type: General Level of consciousness: awake and alert Pain management: pain level controlled Vital Signs Assessment: post-procedure vital signs reviewed and stable Respiratory status: spontaneous breathing, nonlabored ventilation, respiratory function stable and patient connected to nasal cannula oxygen Cardiovascular status: blood pressure returned to baseline and stable Postop Assessment: no apparent nausea or vomiting Anesthetic complications: no    Last Vitals:  Vitals:   11/29/17 0915 11/29/17 0930  BP: (!) 165/84   Pulse: 87 83  Resp: 15 18  Temp:  36.6 C  SpO2: 95% 95%    Last Pain:  Vitals:   11/29/17 0856  TempSrc:   PainSc: 4                  Maelani Yarbro

## 2017-11-29 NOTE — Op Note (Signed)
LAPAROSCOPIC CHOLECYSTECTOMY  Procedure Note  Ryan Roach 11/29/2017   Pre-op Diagnosis: cholecystitis with cholelithiasis     Post-op Diagnosis: same  Procedure(s): LAPAROSCOPIC CHOLECYSTECTOMY  Surgeon(s): Coralie Keens, MD  Anesthesia: General  Staff:  Circulator: Paulette Blanch, RN Relief Circulator: Nicholos Johns, RN Scrub Person: Laverda Sorenson, RN Circulator Assistant: Arlan Organ, RN  Estimated Blood Loss: less than 50 mL               Specimens: sent to path  Indications: This is a 72 year old gentleman who presented with symptomatic cholelithiasis and cholecystitis.  Several years ago he had an ERCP with stone removal from the bile duct.  He again presented with choledocholithiasis and yesterday underwent an ERCP with another stone removal.  There was also nonvisualization of the gallbladder consistent with acute cholecystitis and cystic duct obstruction.  The decision was made to proceed to the operating room for cholecystectomy          Procedure: The patient was brought to the operating room identifies correct patient.  He was placed upon the operating room table and general anesthesia was induced.  His abdomen was then prepped and draped in usual sterile fashion.  I made a vertical incision above the umbilicus as the patient is morbidly obese.  I took this down to the fascia which I then opened the scalpel.  A hemostat was then used to pass to the peritoneal cavity under direct vision.  A 0 Vicryl pushing suture was then placed around the fascial opening.  The [facility] port was placed the opening and insufflation of the abdomen was begun.  The patient was then placed in the reverse Trendelenburg position.  I placed a 5 mm trocar in the patient's epigastrium.  The patient had a very large omentum.  He had a small liver as well as a contracted gallbladder with cholecystitis.  This was well up in the right upper quadrant above the distal  margin.  I placed two 5 mm trochars under direct vision in the right upper quadrant.  I had to place a third trocar in order to retract back his large omentum.  I then dissected out the base of the gallbladder.  Again this was very difficult as it was a friable gallbladder with a large lymph node.  I was able to identify the cystic duct and achieved a critical around it.  As it was foreshortened and friable I held on the cholangiogram.  I placed several clips proximally and distally transected it.  I then identified the cystic artery clipped proximally distally and transected as well.  The gallbladder was slowly dissected free from the liver bed with electrocautery.  Some of the back wall the gallbladder had to be left in the liver bed it was so friable.  Once the gallbladder was completely removed hemostasis appeared to be achieved with the cautery as well as a piece of surgical snow.  I then placed the gallbladder in Endosac and removed through the incision at the umbilicus.  I then copiously irrigated the right upper quadrant normal saline.  Again hemostasis appeared to be achieved.  All ports were removed under direct vision the abdomen was deflated.  I tied off these 0 Vicryl pushing suture at the umbilicus and then placed another 0 Vicryl figure-of-eight suture at the fascia as well.  All incisions were anesthetized Marcaine and closed with 4-0 Monocryl sutures.  Dermabond was then applied.  The patient tolerated the  procedure well.  All counts were correct at the end of the procedure.  The patient was then extubated in the operating room and taken in a stable condition to the recovery room.  Sayaka Hoeppner A   Date: 11/29/2017  Time: 8:47 AM

## 2017-11-29 NOTE — Transfer of Care (Signed)
Immediate Anesthesia Transfer of Care Note  Patient: Ryan Roach  Procedure(s) Performed: LAPAROSCOPIC CHOLECYSTECTOMY (N/A Abdomen)  Patient Location: PACU  Anesthesia Type:General  Level of Consciousness: awake, alert  and oriented  Airway & Oxygen Therapy: Patient Spontanous Breathing and Patient connected to face mask oxygen  Post-op Assessment: Report given to RN and Post -op Vital signs reviewed and stable  Post vital signs: Reviewed and stable  Last Vitals:  Vitals Value Taken Time  BP 141/58 11/29/2017  8:56 AM  Temp    Pulse 88 11/29/2017  8:58 AM  Resp 20 11/29/2017  8:58 AM  SpO2 100 % 11/29/2017  8:58 AM  Vitals shown include unvalidated device data.  Last Pain:  Vitals:   11/29/17 6386  TempSrc: Oral  PainSc:       Patients Stated Pain Goal: 1 (85/48/83 0141)  Complications: No apparent anesthesia complications

## 2017-11-30 ENCOUNTER — Encounter (HOSPITAL_COMMUNITY): Payer: Self-pay | Admitting: Surgery

## 2017-11-30 DIAGNOSIS — K8021 Calculus of gallbladder without cholecystitis with obstruction: Secondary | ICD-10-CM

## 2017-11-30 LAB — COMPREHENSIVE METABOLIC PANEL
ALBUMIN: 2.5 g/dL — AB (ref 3.5–5.0)
ALT: 93 U/L — ABNORMAL HIGH (ref 0–44)
AST: 82 U/L — AB (ref 15–41)
Alkaline Phosphatase: 143 U/L — ABNORMAL HIGH (ref 38–126)
Anion gap: 10 (ref 5–15)
BILIRUBIN TOTAL: 2.6 mg/dL — AB (ref 0.3–1.2)
BUN: 14 mg/dL (ref 8–23)
CO2: 24 mmol/L (ref 22–32)
Calcium: 7.9 mg/dL — ABNORMAL LOW (ref 8.9–10.3)
Chloride: 107 mmol/L (ref 98–111)
Creatinine, Ser: 0.64 mg/dL (ref 0.61–1.24)
GFR calc Af Amer: >60 mL/min (ref >60)
GFR calc non Af Amer: >60 mL/min (ref >60)
GLUCOSE: 112 mg/dL — AB (ref 70–99)
POTASSIUM: 4.4 mmol/L (ref 3.5–5.1)
Sodium: 141 mmol/L (ref 135–145)
TOTAL PROTEIN: 6.3 g/dL — AB (ref 6.5–8.1)

## 2017-11-30 LAB — CBC
HEMATOCRIT: 39 % (ref 39.0–52.0)
HEMOGLOBIN: 12 g/dL — AB (ref 13.0–17.0)
MCH: 28.4 pg (ref 26.0–34.0)
MCHC: 30.8 g/dL (ref 30.0–36.0)
MCV: 92.2 fL (ref 80.0–100.0)
Platelets: 366 10*3/uL (ref 150–400)
RBC: 4.23 MIL/uL (ref 4.22–5.81)
RDW: 13.1 % (ref 11.5–15.5)
WBC: 11.5 10*3/uL — ABNORMAL HIGH (ref 4.0–10.5)
nRBC: 0 % (ref 0.0–0.2)

## 2017-11-30 LAB — MAGNESIUM: MAGNESIUM: 2 mg/dL (ref 1.7–2.4)

## 2017-11-30 MED ORDER — OXYCODONE-ACETAMINOPHEN 5-325 MG PO TABS: 1.0000 | ORAL_TABLET | Freq: Four times a day (QID) | ORAL | 0 refills | Status: DC | PRN

## 2017-11-30 MED ORDER — OXYCODONE HCL 5 MG PO TABS: 5.0000 mg | ORAL_TABLET | Freq: Four times a day (QID) | ORAL | 0 refills | Status: AC | PRN

## 2017-11-30 NOTE — Discharge Summary (Addendum)
PATIENT DETAILS Name: Ryan Roach Age: 72 y.o. Sex: male Date of Birth: 12-02-1945 MRN: [id number]. Admitting Physician: Jani Gravel, MD HFW:YOVZC, Lattie Haw, MD  Admit Date: 11/27/2017 Discharge date: 11/30/2017  Recommendations for Outpatient Follow-up:  1. Follow up with PCP in 1-2 weeks 2. Please obtain BMP/CBC in one week 3. Please ensure follow-up with general surgery.  Admitted From:  Home  Disposition: Home with home health services   Home Health: Yes  Equipment/Devices: None  Discharge Condition: Stable  CODE STATUS: FULL CODE  Diet recommendation:   Regular  Brief Summary: See H&P, Labs, Consult and Test reports for all details in brief, patient is a 71 year old male with prior history of choledocholithiasis requiring ERCP-he presented with abdominal pain-he was found to have acute cholecystitis and choledocholithiasis.  He was initially placed on broad-spectrum antimicrobial therapy-GI was consulted-he underwent ERCP with removal of CBD stones, subsequently underwent laparoscopic cholecystectomy on 10/22.  See below for further details.  Brief Hospital Course: Acute calculus cholecystitis with choledocholithiasis: Presented with right upper quadrant abdominal pain-evaluated by general surgery and gastroenterology.  Underwent ERCP with CBD stone removal on 10/21, subsequently underwent laparoscopic cholecystectomy on 10/22.  No major postoperative events.  Passing flatus-tolerating advancement in diet.  Has ambulated in the room to the bathroom.  Spoke with general surgery-no need for further antibiotic therapy-stable for discharge.  Please ensure patient follows up with general surgery in the outpatient setting.  Hyperkalemia: Resolved  Chronic diastolic heart failure: Compensated-no need for diuretics at this point  Anxiety with depression: Stable-continue Celexa  Procedures/Studies: 10/21>>ERCP 10/22>>Lap cholecystectomy  Discharge Diagnoses:    Principal Problem:   Abnormal liver function Active Problems:   Morbid obesity (HCC)   Hypokalemia   Calculus of bile duct with obstruction and without cholangitis or cholecystitis   BPH (benign prostatic hyperplasia)   Benign neoplasm of transverse colon   Discharge Instructions:  Activity:  As tolerated with Full fall precautions use walker/cane & assistance as needed  Discharge Instructions    Call MD for:  redness, tenderness, or signs of infection (pain, swelling, redness, odor or green/yellow discharge around incision site)   Complete by:  As directed    Diet general   Complete by:  As directed    Discharge instructions   Complete by:  As directed    Follow with Primary MD  Benny Lennert, MD in 1 week  Follow-up with general surgery as instructed  Please get a complete blood count and chemistry panel checked by your Primary MD at your next visit, and again as instructed by your Primary MD.  Get Medicines reviewed and adjusted: Please take all your medications with you for your next visit with your Primary MD  Laboratory/radiological data: Please request your Primary MD to go over all hospital tests and procedure/radiological results at the follow up, please ask your Primary MD to get all Hospital records sent to his/her office.  In some cases, they will be blood work, cultures and biopsy results pending at the time of your discharge. Please request that your primary care M.D. follows up on these results.  Also Note the following: If you experience worsening of your admission symptoms, develop shortness of breath, life threatening emergency, suicidal or homicidal thoughts you must seek medical attention immediately by calling 911 or calling your MD immediately  if symptoms less severe.  You must read complete instructions/literature along with all the possible adverse reactions/side effects for all the Medicines you take and that have been  prescribed to you. Take any new  Medicines after you have completely understood and accpet all the possible adverse reactions/side effects.   Do not drive when taking Pain medications or sleeping medications (Benzodaizepines)  Do not take more than prescribed Pain, Sleep and Anxiety Medications. It is not advisable to combine anxiety,sleep and pain medications without talking with your primary care practitioner  Special Instructions: If you have smoked or chewed Tobacco  in the last 2 yrs please stop smoking, stop any regular Alcohol  and or any Recreational drug use.  Wear Seat belts while driving.  Please note: You were cared for by a hospitalist during your hospital stay. Once you are discharged, your primary care physician will handle any further medical issues. Please note that NO REFILLS for any discharge medications will be authorized once you are discharged, as it is imperative that you return to your primary care physician (or establish a relationship with a primary care physician if you do not have one) for your post hospital discharge needs so that they can reassess your need for medications and monitor your lab values.   Increase activity slowly   Complete by:  As directed      Allergies as of 11/30/2017   No Known Allergies     Medication List    TAKE these medications   acetaminophen 500 MG tablet Commonly known as:  TYLENOL Take 500-1,000 mg by mouth every 4 (four) hours as needed for headache.   citalopram 20 MG tablet Commonly known as:  CELEXA Take 20 mg by mouth daily.   oxyCODONE 5 MG immediate release tablet Commonly known as:  Oxy IR/ROXICODONE Take 1 tablet (5 mg total) by mouth every 6 (six) hours as needed.      Follow-up Information    Benny Lennert, MD. Schedule an appointment as soon as possible for a visit in 1 week(s).   Specialty:  Family Medicine Contact information: [place] [address] [phone]          No Known  Allergies  Consultations:   GI and general surgery  Other Procedures/Studies: Dg Chest 2 View  Result Date: 11/27/2017 CLINICAL DATA:  Abdominal pain and nausea since yesterday. EXAM: CHEST - 2 VIEW COMPARISON:  PA and lateral chest 07/21/2016.  CT chest 05/13/2005. FINDINGS: The lungs are clear. Heart size is normal. No pneumothorax or pleural fluid. No acute or focal bony abnormality. IMPRESSION: No acute disease. Electronically Signed   By: Inge Rise M.D.   On: 11/27/2017 14:43   Ct Abdomen Pelvis W Contrast  Result Date: 11/27/2017 CLINICAL DATA:  Abdominal pain. Left lower and left upper quadrant pain, onset yesterday. Nausea. EXAM: CT ABDOMEN AND PELVIS WITH CONTRAST TECHNIQUE: Multidetector CT imaging of the abdomen and pelvis was performed using the standard protocol following bolus administration of intravenous contrast. CONTRAST:  178mL OMNIPAQUE IOHEXOL 300 MG/ML  SOLN COMPARISON:  Right upper quadrant ultrasound earlier this day. MRCP 06/26/2016. Abdominal CT 05/14/2015 FINDINGS: Lower chest: No consolidation or pleural fluid. Hepatobiliary: Recurrent intra and extrahepatic biliary ductal dilatation with common bile duct measuring 11 mm. Gallbladder physiologically distended with wall thickening and mild pericholecystic stranding. No calcified choledocholithiasis. No focal hepatic lesion. Pancreas: No ductal dilatation or inflammation. The previous questioned hypodense mass on CT in the pancreatic head is not seen. Spleen: Normal in size without focal abnormality. Adrenals/Urinary Tract: Normal adrenal glands. No hydronephrosis or perinephric edema. Homogeneous renal enhancement with symmetric excretion on delayed phase imaging.  Urinary bladder is physiologically distended without wall thickening. Stomach/Bowel: Stomach is within normal limits. Appendix appears normal. No evidence of bowel wall thickening, distention, or inflammatory changes. Vascular/Lymphatic: Small left central  mesenteric nodes with adjacent mesenteric edema, unchanged from prior exam. Aortic atherosclerosis without aneurysm. An 11 mm portal caval node is unchanged from prior and likely reactive. Reproductive: Enlarged prostate gland spanning 6.8 cm transverse. Other: No free air or ascites. Fat containing umbilical hernia with diastasis of the anterior abdominal wall musculature. Musculoskeletal: Degenerative change in both hips and throughout the lumbar spine. There are no acute or suspicious osseous abnormalities. IMPRESSION: 1. Recurrent intra and extrahepatic biliary ductal dilatation, common bile duct measures 11 mm. Mild gallbladder wall thickening with pericholecystic edema suggests acute gallbladder inflammation, no abnormal gallbladder distention. MRCP or ERCP may be of value. 2. No other acute finding in the abdomen or pelvis. 3. Incidental findings of prostatomegaly and Aortic Atherosclerosis (ICD10-I70.0). Electronically Signed   By: Keith Rake M.D.   On: 11/27/2017 04:51   Dg Ercp  Result Date: 11/28/2017 CLINICAL DATA:  Biliary dilatation and choledocholithiasis. EXAM: ERCP TECHNIQUE: Multiple spot images obtained with the fluoroscopic device and submitted for interpretation post-procedure. COMPARISON:  CT of the abdomen on 11/27/2017 FINDINGS: Imaging during ERCP with a C-arm demonstrates cannulation of the common bile duct with cholangiogram demonstrating irregular filling defects in a dilated common bile duct. A balloon sweep maneuver was performed to extract calculi. Completion cholangiogram shows a normally patent common bile duct without evidence of filling defects. IMPRESSION: Evidence of choledocholithiasis with filling defects in the common bile duct. Balloon sweep extraction of calculi was performed. These images were submitted for radiologic interpretation only. Please see the procedural report for the amount of contrast and the fluoroscopy time utilized. Electronically Signed   By:  Aletta Edouard M.D.   On: 11/28/2017 10:47   US Abdomen Limited Ruq  Result Date: 11/27/2017 CLINICAL DATA:  Right upper quadrant pain. EXAM: ULTRASOUND ABDOMEN LIMITED RIGHT UPPER QUADRANT COMPARISON:  MRCP 06/26/2016 FINDINGS: Technically limited exam due to body habitus. Gallbladder: Physiologically distended. Small stones and sludge in the gallbladder. Mild gallbladder wall thickening of 4 mm, wall thickness is not well assessed. No definite pericholecystic fluid. No sonographic Murphy sign noted by sonographer. Common bile duct: Diameter: 11 mm proximally. Liver: Diffusely increased and heterogeneous in echogenicity. Liver parenchyma is difficult to penetrate. Portal vein is patent on color Doppler imaging with normal direction of blood flow towards the liver. IMPRESSION: 1. Stones and sludge in the gallbladder with mild gallbladder wall thickening. No sonographic Murphy sign. 2. Mild biliary dilatation with increased common bile duct from prior MRCP, currently 11 mm, previously 6 mm. Recurrent choledocholithiasis is considered. Consider repeat MRCP if patient is able tolerate breath hold technique versus nuclear medicine HIDA scan. Electronically Signed   By: Keith Rake M.D.   On: 11/27/2017 04:19     TODAY-DAY OF DISCHARGE:  Subjective:   Ryan Roach today has no headache,no chest abdominal pain,no new weakness tingling or numbness, feels much better wants to go home today.   Objective:   Blood pressure 138/77, pulse 71, temperature 97.9 F (36.6 C), temperature source Oral, resp. rate 18, height 5\' 11"  (1.803 m), weight (!) 160.6 kg, SpO2 96 %.  Intake/Output Summary (Last 24 hours) at 11/30/2017 0850 Last data filed at 11/30/2017 0300 Gross per 24 hour  Intake 401.77 ml  Output 570 ml  Net -168.23 ml   Filed Weights   11/27/17 0054  11/30/17 0500 11/30/17 0536  Weight: (!) 145.2 kg (!) 162.7 kg (!) 160.6 kg    Exam: Awake Alert, Oriented *3, No new F.N deficits,  Normal affect [place].AT,PERRAL Supple Neck,No JVD, No cervical lymphadenopathy appriciated.  Symmetrical Chest wall movement, Good air movement bilaterally, CTAB RRR,No Gallops,Rubs or new Murmurs, No Parasternal Heave +ve B.Sounds, Abd Soft, Non tender, No organomegaly appriciated, No rebound -guarding or rigidity. No Cyanosis, Clubbing or edema, No new Rash or bruise   PERTINENT RADIOLOGIC STUDIES: Dg Chest 2 View  Result Date: 11/27/2017 CLINICAL DATA:  Abdominal pain and nausea since yesterday. EXAM: CHEST - 2 VIEW COMPARISON:  PA and lateral chest 07/21/2016.  CT chest 05/13/2005. FINDINGS: The lungs are clear. Heart size is normal. No pneumothorax or pleural fluid. No acute or focal bony abnormality. IMPRESSION: No acute disease. Electronically Signed   By: Inge Rise M.D.   On: 11/27/2017 14:43   Ct Abdomen Pelvis W Contrast  Result Date: 11/27/2017 CLINICAL DATA:  Abdominal pain. Left lower and left upper quadrant pain, onset yesterday. Nausea. EXAM: CT ABDOMEN AND PELVIS WITH CONTRAST TECHNIQUE: Multidetector CT imaging of the abdomen and pelvis was performed using the standard protocol following bolus administration of intravenous contrast. CONTRAST:  141mL OMNIPAQUE IOHEXOL 300 MG/ML  SOLN COMPARISON:  Right upper quadrant ultrasound earlier this day. MRCP 06/26/2016. Abdominal CT 05/14/2015 FINDINGS: Lower chest: No consolidation or pleural fluid. Hepatobiliary: Recurrent intra and extrahepatic biliary ductal dilatation with common bile duct measuring 11 mm. Gallbladder physiologically distended with wall thickening and mild pericholecystic stranding. No calcified choledocholithiasis. No focal hepatic lesion. Pancreas: No ductal dilatation or inflammation. The previous questioned hypodense mass on CT in the pancreatic head is not seen. Spleen: Normal in size without focal abnormality. Adrenals/Urinary Tract: Normal adrenal glands. No hydronephrosis or perinephric edema. Homogeneous  renal enhancement with symmetric excretion on delayed phase imaging. Urinary bladder is physiologically distended without wall thickening. Stomach/Bowel: Stomach is within normal limits. Appendix appears normal. No evidence of bowel wall thickening, distention, or inflammatory changes. Vascular/Lymphatic: Small left central mesenteric nodes with adjacent mesenteric edema, unchanged from prior exam. Aortic atherosclerosis without aneurysm. An 11 mm portal caval node is unchanged from prior and likely reactive. Reproductive: Enlarged prostate gland spanning 6.8 cm transverse. Other: No free air or ascites. Fat containing umbilical hernia with diastasis of the anterior abdominal wall musculature. Musculoskeletal: Degenerative change in both hips and throughout the lumbar spine. There are no acute or suspicious osseous abnormalities. IMPRESSION: 1. Recurrent intra and extrahepatic biliary ductal dilatation, common bile duct measures 11 mm. Mild gallbladder wall thickening with pericholecystic edema suggests acute gallbladder inflammation, no abnormal gallbladder distention. MRCP or ERCP may be of value. 2. No other acute finding in the abdomen or pelvis. 3. Incidental findings of prostatomegaly and Aortic Atherosclerosis (ICD10-I70.0). Electronically Signed   By: Keith Rake M.D.   On: 11/27/2017 04:51   Dg Ercp  Result Date: 11/28/2017 CLINICAL DATA:  Biliary dilatation and choledocholithiasis. EXAM: ERCP TECHNIQUE: Multiple spot images obtained with the fluoroscopic device and submitted for interpretation post-procedure. COMPARISON:  CT of the abdomen on 11/27/2017 FINDINGS: Imaging during ERCP with a C-arm demonstrates cannulation of the common bile duct with cholangiogram demonstrating irregular filling defects in a dilated common bile duct. A balloon sweep maneuver was performed to extract calculi. Completion cholangiogram shows a normally patent common bile duct without evidence of filling defects.  IMPRESSION: Evidence of choledocholithiasis with filling defects in the common bile duct. Balloon sweep extraction of calculi  was performed. These images were submitted for radiologic interpretation only. Please see the procedural report for the amount of contrast and the fluoroscopy time utilized. Electronically Signed   By: Aletta Edouard M.D.   On: 11/28/2017 10:47   US Abdomen Limited Ruq  Result Date: 11/27/2017 CLINICAL DATA:  Right upper quadrant pain. EXAM: ULTRASOUND ABDOMEN LIMITED RIGHT UPPER QUADRANT COMPARISON:  MRCP 06/26/2016 FINDINGS: Technically limited exam due to body habitus. Gallbladder: Physiologically distended. Small stones and sludge in the gallbladder. Mild gallbladder wall thickening of 4 mm, wall thickness is not well assessed. No definite pericholecystic fluid. No sonographic Murphy sign noted by sonographer. Common bile duct: Diameter: 11 mm proximally. Liver: Diffusely increased and heterogeneous in echogenicity. Liver parenchyma is difficult to penetrate. Portal vein is patent on color Doppler imaging with normal direction of blood flow towards the liver. IMPRESSION: 1. Stones and sludge in the gallbladder with mild gallbladder wall thickening. No sonographic Murphy sign. 2. Mild biliary dilatation with increased common bile duct from prior MRCP, currently 11 mm, previously 6 mm. Recurrent choledocholithiasis is considered. Consider repeat MRCP if patient is able tolerate breath hold technique versus nuclear medicine HIDA scan. Electronically Signed   By: Keith Rake M.D.   On: 11/27/2017 04:19     PERTINENT LAB RESULTS: CBC: Recent Labs    11/29/17 0348 11/30/17 0422  WBC 8.4 11.5*  HGB 11.9* 12.0*  HCT 38.7* 39.0  PLT 273 366   CMET CMP     Component Value Date/Time   NA 141 11/30/2017 0422   K 4.4 11/30/2017 0422   CL 107 11/30/2017 0422   CO2 24 11/30/2017 0422   GLUCOSE 112 (H) 11/30/2017 0422   BUN 14 11/30/2017 0422   CREATININE 0.64  11/30/2017 0422   CALCIUM 7.9 (L) 11/30/2017 0422   PROT 6.3 (L) 11/30/2017 0422   ALBUMIN 2.5 (L) 11/30/2017 0422   AST 82 (H) 11/30/2017 0422   ALT 93 (H) 11/30/2017 0422   ALKPHOS 143 (H) 11/30/2017 0422   BILITOT 2.6 (H) 11/30/2017 0422   GFRNONAA >60 11/30/2017 0422   GFRAA >60 11/30/2017 0422    GFR Estimated Creatinine Clearance: 131.1 mL/min (by C-G formula based on SCr of 0.64 mg/dL). Recent Labs    11/29/17 0348  AMYLASE 56   No results for input(s): CKTOTAL, CKMB, CKMBINDEX, TROPONINI in the last 72 hours. Invalid input(s): POCBNP No results for input(s): DDIMER in the last 72 hours. No results for input(s): HGBA1C in the last 72 hours. No results for input(s): CHOL, HDL, LDLCALC, TRIG, CHOLHDL, LDLDIRECT in the last 72 hours. No results for input(s): TSH, T4TOTAL, T3FREE, THYROIDAB in the last 72 hours.  Invalid input(s): FREET3 No results for input(s): VITAMINB12, FOLATE, FERRITIN, TIBC, IRON, RETICCTPCT in the last 72 hours. Coags: Recent Labs    11/27/17 1136  INR 1.18   Microbiology: Recent Results (from the past 240 hour(s))  Surgical pcr screen     Status: None   Collection Time: 11/28/17  5:25 AM  Result Value Ref Range Status   MRSA, PCR NEGATIVE NEGATIVE Final   Staphylococcus aureus NEGATIVE NEGATIVE Final    Comment: (NOTE) The Xpert SA Assay (FDA approved for NASAL specimens in patients 28 years of age and older), is one component of a comprehensive surveillance program. It is not intended to diagnose infection nor to guide or monitor treatment. Performed at [facility], [place] [address], [place], [place] [address]     FURTHER DISCHARGE INSTRUCTIONS:  Get Medicines reviewed and adjusted: Please take all your medications with you for your next visit with your Primary MD  Laboratory/radiological data: Please request your Primary MD to go over all hospital tests and procedure/radiological results at the follow up, please ask your  Primary MD to get all Hospital records sent to his/her office.  In some cases, they will be blood work, cultures and biopsy results pending at the time of your discharge. Please request that your primary care M.D. goes through all the records of your hospital data and follows up on these results.  Also Note the following: If you experience worsening of your admission symptoms, develop shortness of breath, life threatening emergency, suicidal or homicidal thoughts you must seek medical attention immediately by calling 911 or calling your MD immediately  if symptoms less severe.  You must read complete instructions/literature along with all the possible adverse reactions/side effects for all the Medicines you take and that have been prescribed to you. Take any new Medicines after you have completely understood and accpet all the possible adverse reactions/side effects.   Do not drive when taking Pain medications or sleeping medications (Benzodaizepines)  Do not take more than prescribed Pain, Sleep and Anxiety Medications. It is not advisable to combine anxiety,sleep and pain medications without talking with your primary care practitioner  Special Instructions: If you have smoked or chewed Tobacco  in the last 2 yrs please stop smoking, stop any regular Alcohol  and or any Recreational drug use.  Wear Seat belts while driving.  Please note: You were cared for by a hospitalist during your hospital stay. Once you are discharged, your primary care physician will handle any further medical issues. Please note that NO REFILLS for any discharge medications will be authorized once you are discharged, as it is imperative that you return to your primary care physician (or establish a relationship with a primary care physician if you do not have one) for your post hospital discharge needs so that they can reassess your need for medications and monitor your lab values.  Total Time spent coordinating discharge  including counseling, education and face to face time equals 35 minutes.  SignedOren Binet 11/30/2017 8:50 AM

## 2017-11-30 NOTE — Discharge Instructions (Signed)
[facility] ° °Please arrive at least 30 min before your appointment to complete your check in paperwork.  If you are unable to arrive 30 min prior to your appointment time we may have to cancel or reschedule you. °LAPAROSCOPIC SURGERY: POST OP INSTRUCTIONS °Always review your discharge instruction sheet given to you by the facility where your surgery was performed. °IF YOU HAVE DISABILITY OR FAMILY LEAVE FORMS, YOU MUST BRING THEM TO THE OFFICE FOR PROCESSING.   °DO NOT GIVE THEM TO YOUR DOCTOR. ° °PAIN CONTROL ° °1. First take acetaminophen (Tylenol) AND/or ibuprofen (Advil) to control your pain after surgery.  Follow directions on package.  Taking acetaminophen (Tylenol) and/or ibuprofen (Advil) regularly after surgery will help to control your pain and lower the amount of prescription pain medication you may need.  You should not take more than 4,000 mg (4 grams) of acetaminophen (Tylenol) in 24 hours.  You should not take ibuprofen (Advil), aleve, motrin, naprosyn or other NSAIDS if you have a history of stomach ulcers or chronic kidney disease.  °2. A prescription for pain medication may be given to you upon discharge.  Take your pain medication as prescribed, if you still have uncontrolled pain after taking acetaminophen (Tylenol) or ibuprofen (Advil). °3. Use ice packs to help control pain. °4. If you need a refill on your pain medication, please contact your pharmacy.  They will contact our office to request authorization. Prescriptions will not be filled after 5pm or on week-ends. ° °HOME MEDICATIONS °5. Take your usually prescribed medications unless otherwise directed. ° °DIET °6. You should follow a light diet the first few days after arrival home.  Be sure to include lots of fluids daily. Avoid fatty, fried foods.  ° °CONSTIPATION °7. It is common to experience some constipation after surgery and if you are taking pain medication.  Increasing fluid intake and taking a stool  softener (such as Colace) will usually help or prevent this problem from occurring.  A mild laxative (Milk of Magnesia or Miralax) should be taken according to package instructions if there are no bowel movements after 48 hours. ° °WOUND/INCISION CARE °8. Most patients will experience some swelling and bruising in the area of the incisions.  Ice packs will help.  Swelling and bruising can take several days to resolve.  °9. Unless discharge instructions indicate otherwise, follow guidelines below  °a. STERI-STRIPS - you may remove your outer bandages 48 hours after surgery, and you may shower at that time.  You have steri-strips (small skin tapes) in place directly over the incision.  These strips should be left on the skin for 7-10 days.   °b. DERMABOND/SKIN GLUE - you may shower in 24 hours.  The glue will flake off over the next 2-3 weeks. °10. Any sutures or staples will be removed at the office during your follow-up visit. ° °ACTIVITIES °11. You may resume regular (light) daily activities beginning the next day--such as daily self-care, walking, climbing stairs--gradually increasing activities as tolerated.  You may have sexual intercourse when it is comfortable.  Refrain from any heavy lifting or straining until approved by your doctor. °a. You may drive when you are no longer taking prescription pain medication, you can comfortably wear a seatbelt, and you can safely maneuver your car and apply brakes. ° °FOLLOW-UP °12. You should see your doctor in the office for a follow-up appointment approximately 2-3 weeks after your surgery.  You should have been given your post-op/follow-up appointment when   your surgery was scheduled.  If you did not receive a post-op/follow-up appointment, make sure that you call for this appointment within a day or two after you arrive home to insure a convenient appointment time. ° ° °WHEN TO CALL YOUR DOCTOR: °1. Fever over 101.0 °2. Inability to urinate °3. Continued bleeding from  incision. °4. Increased pain, redness, or drainage from the incision. °5. Increasing abdominal pain ° °The clinic staff is available to answer your questions during regular business hours.  Please don’t hesitate to call and ask to speak to one of the nurses for clinical concerns.  If you have a medical emergency, go to the nearest emergency room or call 911.  A surgeon from [facility] is always on call at the hospital. °[address], [address], [place], [place]  [address] ? [address] [address], [place], [place]   [address] °[phone] ? [phone] ? FAX [phone] ° °

## 2017-11-30 NOTE — Care Management Note (Signed)
Case Management Note  Patient Details  Name: Ryan Roach MRN: [id number] Date of Birth: 22-Nov-1945  Subjective/Objective:   Laparoscopic Cholecystectomy                 Action/Plan: Spoke to pt at bedside. Offered choice for [facility]. Pt states he had [facility] in the past for [facility]. Contacted [facility] with new referral. Pt has RW and cane. Wife and adult children at home to assist with care.   PCP Benny Lennert MD  Expected Discharge Date:  11/30/17               Expected Discharge Plan:  [place]  In-House Referral:  NA  Discharge planning Services  CM Consult  Post Acute Care Choice:  Home Health Choice offered to:  Patient  DME Arranged:  N/A DME Agency:  NA  HH Arranged:  PT [place] Agency:  [place]  Status of Service:  Completed, signed off  If discussed at [place] of Stay Meetings, dates discussed:    Additional Comments:  Erenest Rasher, RN 11/30/2017, 10:01 AM

## 2017-11-30 NOTE — Progress Notes (Signed)
Patient ID: Ryan Roach, male   DOB: 1946/02/07, 72 y.o.   MRN: [id number]    1 Day Post-Op  Subjective: Patient up in a chair and feels well today.  Some soreness.  Tolerating liquids.  Had a BM this morning.  Objective: Vital signs in last 24 hours: Temp:  [97.9 F (36.6 C)-98.7 F (37.1 C)] 97.9 F (36.6 C) (10/23 0536) Pulse Rate:  [71-76] 71 (10/23 0536) Resp:  [18] 18 (10/23 0536) BP: (138-159)/(63-77) 138/77 (10/23 0536) SpO2:  [94 %-98 %] 96 % (10/23 0536) Weight:  [160.6 kg-162.7 kg] 160.6 kg (10/23 0536) Last BM Date: 11/26/17  Intake/Output from previous day: 10/22 0701 - 10/23 0700 In: 1401.8 [P.O.:100; I.V.:1000; IV Piggyback:301.8] Out: 600 [Urine:570; Blood:30] Intake/Output this shift: Total I/O In: -  Out: 200 [Urine:200]  PE: Abd: obese, but soft, incisions c/d/i, +BS, appropriately tender  Lab Results:  Recent Labs    11/29/17 0348 11/30/17 0422  WBC 8.4 11.5*  HGB 11.9* 12.0*  HCT 38.7* 39.0  PLT 273 366   BMET Recent Labs    11/29/17 0348 11/29/17 1139 11/30/17 0422  NA 142  --  141  K 5.2* 3.7 4.4  CL 109  --  107  CO2 26  --  24  GLUCOSE 139*  --  112*  BUN 14  --  14  CREATININE 0.72  --  0.64  CALCIUM 8.3*  --  7.9*   PT/INR Recent Labs    11/27/17 1136  LABPROT 14.9  INR 1.18   CMP     Component Value Date/Time   NA 141 11/30/2017 0422   K 4.4 11/30/2017 0422   CL 107 11/30/2017 0422   CO2 24 11/30/2017 0422   GLUCOSE 112 (H) 11/30/2017 0422   BUN 14 11/30/2017 0422   CREATININE 0.64 11/30/2017 0422   CALCIUM 7.9 (L) 11/30/2017 0422   PROT 6.3 (L) 11/30/2017 0422   ALBUMIN 2.5 (L) 11/30/2017 0422   AST 82 (H) 11/30/2017 0422   ALT 93 (H) 11/30/2017 0422   ALKPHOS 143 (H) 11/30/2017 0422   BILITOT 2.6 (H) 11/30/2017 0422   GFRNONAA >60 11/30/2017 0422   GFRAA >60 11/30/2017 0422   Lipase     Component Value Date/Time   LIPASE 28 11/27/2017 0723       Studies/Results: No results  found.  Anti-infectives: Anti-infectives (From admission, onward)   Start     Dose/Rate Route Frequency Ordered Stop   11/29/17 0700  ceFAZolin (ANCEF) 3 g in dextrose 5 % 50 mL IVPB  Status:  Discontinued     3 g 100 mL/hr over 30 Minutes Intravenous To [facility] 11/28/17 1941 11/29/17 0943   11/29/17 0600  ceFAZolin (ANCEF) 3 g in dextrose 5 % 50 mL IVPB  Status:  Discontinued     3 g 100 mL/hr over 30 Minutes Intravenous On call to O.R. 11/28/17 1941 11/28/17 1958   11/28/17 0600  cefTRIAXone (ROCEPHIN) 2 g in sodium chloride 0.9 % 100 mL IVPB     2 g 200 mL/hr over 30 Minutes Intravenous Every 24 hours 11/27/17 1400     11/27/17 1200  metroNIDAZOLE (FLAGYL) IVPB 500 mg     500 mg 100 mL/hr over 60 Minutes Intravenous Every 8 hours 11/27/17 1047     11/27/17 0515  cefTRIAXone (ROCEPHIN) 1 g in sodium chloride 0.9 % 100 mL IVPB     1 g 200 mL/hr over 30 Minutes Intravenous  Once 11/27/17 0502 11/27/17  0554       Assessment/Plan Cholecystitis, choledocholithiasis, POD 1, s/p lap chole, s/p ERCP -patient doing well this morning. -tolerating liquids and pain controlled. -surgically stable for DC home -follow up has been arranged and DC instructions discussed with patient.  FEN - low fat diet VTE - SCDs ID - no further needed   LOS: 2 days    Henreitta Cea , [facility] 11/30/2017, 10:38 AM Pager: [phone]

## 2017-11-30 NOTE — Progress Notes (Signed)
Prudencio Burly Memmer discharged Home per MD order.  Discharge instructions reviewed and discussed with the patient, all questions and concerns answered. Copy of instructions, care notes for new medications & diagnosis and scripts given to patient.  Allergies as of 11/30/2017   No Known Allergies     Medication List    TAKE these medications   acetaminophen 500 MG tablet Commonly known as:  TYLENOL Take 500-1,000 mg by mouth every 4 (four) hours as needed for headache.   citalopram 20 MG tablet Commonly known as:  CELEXA Take 20 mg by mouth daily.   oxyCODONE 5 MG immediate release tablet Commonly known as:  Oxy IR/ROXICODONE Take 1 tablet (5 mg total) by mouth every 6 (six) hours as needed.       IV site discontinued and catheter remains intact. Site without signs and symptoms of complications. Dressing and pressure applied.  Patient escorted to car by volunteer in a wheelchair,  no distress noted upon discharge.  Wynetta Emery, Drinda Belgard C 11/30/2017 12:31 PM

## 2017-11-30 NOTE — Evaluation (Signed)
Physical Therapy Evaluation Patient Details Name: Ryan Roach MRN: [id number] DOB: 03/22/1945 Today's Date: 11/30/2017   History of Present Illness  Pt is a 72 y/o male admitted secondary to abdominal pain s/p ERCP. PMH including but not limited to arthritis and pancreatic mass.     Clinical Impression  Pt presented sitting OOB in recliner chair, awake and willing to participate in therapy session. Prior to admission, pt reported that he ambulated with use of RW and required some assistance from family for ADLs. Pt lives in a single level home with two steps to enter. He is currently very limited secondary to weakness and fatigue. Pt required mod A for transfers and only able ambulate a very short distance in room with RW and min guard. Pt would continue to benefit from skilled physical therapy services at this time while admitted and after d/c to address the below listed limitations in order to improve overall safety and independence with functional mobility.     Follow Up Recommendations Home health PT;Supervision/Assistance - 24 hour;Other (comment)(pt adamant about returning home and would prefer OP PT)    Equipment Recommendations  None recommended by PT    Recommendations for Other Services       Precautions / Restrictions Precautions Precautions: Fall Restrictions Weight Bearing Restrictions: No      Mobility  Bed Mobility               General bed mobility comments: pt sitting OOB in recliner chair upon arrival  Transfers Overall transfer level: Needs assistance Equipment used: Rolling walker (2 wheeled) Transfers: Sit to/from Stand Sit to Stand: Mod assist         General transfer comment: increased time and effort, cueing for safe hand placement, mod A to power into standing from recliner chair (pt stated he has a lift chair at home)  Ambulation/Gait Ambulation/Gait assistance: Min guard Gait Distance (Feet): 5 Feet Assistive device: Rolling walker (2  wheeled) Gait Pattern/deviations: Step-to pattern;Step-through pattern;Decreased step length - right;Decreased step length - left;Decreased stride length;Shuffle Gait velocity: decreased   General Gait Details: pt very limited secondary to fatigue and weakness, only able to tolerate very short distance ambulation with RW and close min guard  Stairs            Wheelchair Mobility    Modified Rankin (Stroke Patients Only)       Balance Overall balance assessment: Needs assistance Sitting-balance support: Feet supported Sitting balance-Leahy Scale: Fair     Standing balance support: Bilateral upper extremity supported Standing balance-Leahy Scale: Poor                               Pertinent Vitals/Pain Pain Assessment: No/denies pain    Home Living Family/patient expects to be discharged to:: Private residence Living Arrangements: Spouse/significant other;Parent;Children;Other relatives Available Help at Discharge: Family;Available 24 hours/day Type of Home: House Home Access: Stairs to enter Entrance Stairs-Rails: None Entrance Stairs-Number of Steps: 2 Home Layout: One level Home Equipment: Clinical cytogeneticist - 2 wheels;Hand held shower head      Prior Function Level of Independence: Needs assistance   Gait / Transfers Assistance Needed: pt uses RW to ambulate  ADL's / Homemaking Assistance Needed: requires some assistance with ADLs from wife        Hand Dominance        Extremity/Trunk Assessment   Upper Extremity Assessment Upper Extremity Assessment: Generalized weakness    Lower  Extremity Assessment Lower Extremity Assessment: Generalized weakness       Communication   Communication: No difficulties  Cognition Arousal/Alertness: Awake/alert Behavior During Therapy: WFL for tasks assessed/performed Overall Cognitive Status: Impaired/Different from baseline Area of Impairment: Safety/judgement                          Safety/Judgement: Decreased awareness of deficits            General Comments      Exercises     Assessment/Plan    PT Assessment Patient needs continued PT services  PT Problem List Decreased strength;Decreased activity tolerance;Decreased mobility;Decreased balance;Decreased coordination;Decreased knowledge of use of DME;Decreased knowledge of precautions;Decreased safety awareness       PT Treatment Interventions DME instruction;Gait training;Stair training;Functional mobility training;Therapeutic activities;Balance training;Therapeutic exercise;Neuromuscular re-education;Patient/family education    PT Goals (Current goals can be found in the Care Plan section)  Acute Rehab PT Goals Patient Stated Goal: return home today PT Goal Formulation: With patient Time For Goal Achievement: 12/14/17 Potential to Achieve Goals: Good    Frequency Min 3X/week   Barriers to discharge        Co-evaluation               AM-PAC PT "6 Clicks" Daily Activity  Outcome Measure Difficulty turning over in bed (including adjusting bedclothes, sheets and blankets)?: A Lot Difficulty moving from lying on back to sitting on the side of the bed? : A Lot Difficulty sitting down on and standing up from a chair with arms (e.g., wheelchair, bedside commode, etc,.)?: Unable Help needed moving to and from a bed to chair (including a wheelchair)?: A Little Help needed walking in hospital room?: A Little Help needed climbing 3-5 steps with a railing? : A Lot 6 Click Score: 13    End of Session Equipment Utilized During Treatment: Gait belt Activity Tolerance: Patient limited by fatigue Patient left: in bed;with call bell/phone within reach;Other (comment)(sitting EOB) Nurse Communication: Mobility status PT Visit Diagnosis: Other abnormalities of gait and mobility (R26.89)    Time: 5462-7035 PT Time Calculation (min) (ACUTE ONLY): 19 min   Charges:   PT Evaluation $PT Eval Moderate  Complexity: 1 Mod          Sherie Don, PT, DPT  Acute Rehabilitation Services Pager [phone] Office [place] 11/30/2017, 2:47 PM

## 2020-06-30 ENCOUNTER — Other Ambulatory Visit: Payer: Self-pay

## 2020-06-30 DIAGNOSIS — K831 Obstruction of bile duct: Secondary | ICD-10-CM

## 2020-08-05 IMAGING — CT CT ABD-PELV W/ CM
2 of 5 series · 16 of 46 positions shown, 18 images · IV contrast (Omni 300)
Comparison: Right upper quadrant ultrasound earlier this day. MRCP
06/26/2016. Abdominal CT 05/14/2015

CLINICAL DATA: Abdominal pain. Left lower and left upper quadrant
pain, onset yesterday. Nausea.

EXAM:
CT ABDOMEN AND PELVIS WITH CONTRAST
TECHNIQUE: Multidetector CT imaging of the abdomen and pelvis was performed
using the standard protocol following bolus administration of
intravenous contrast.
CONTRAST:  100mL OMNIPAQUE IOHEXOL 300 MG/ML  SOLN

[Series 3: a/p w/ 5mm · axial · 0.98mm/px · z∈[+928,+1408]mm · 13 of 108 slices shown, 15 images]
[im 6/108  soft-tissue]
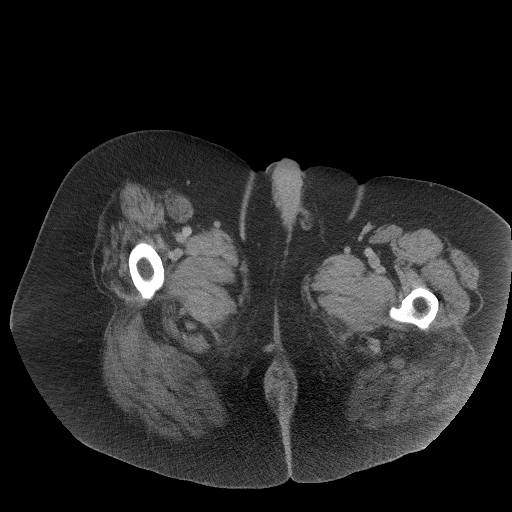
[im 6/108  bone]
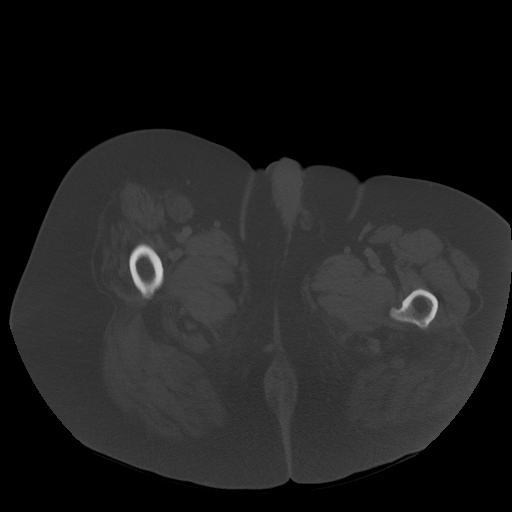
[im 17/108  soft-tissue]
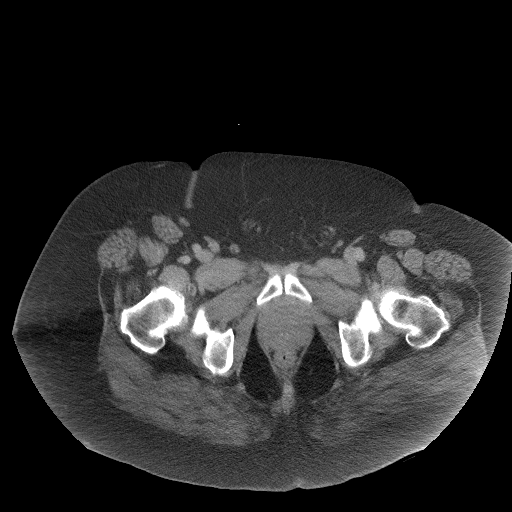
[im 23/108  soft-tissue]
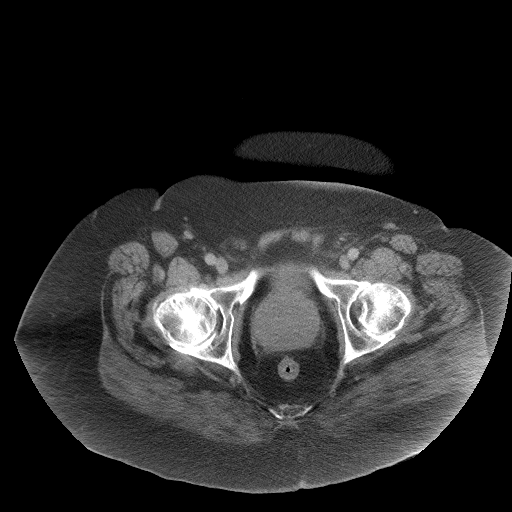
[im 29/108  soft-tissue]
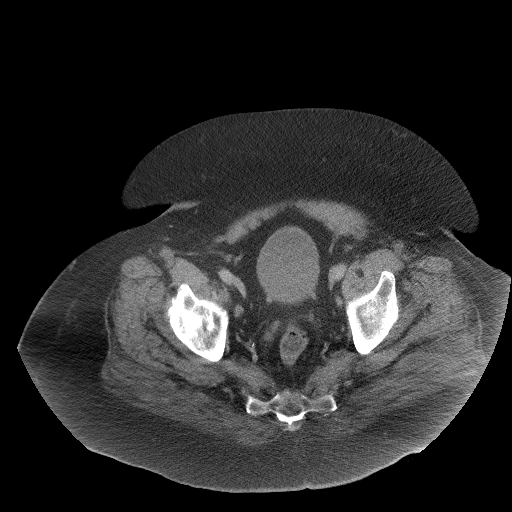
[im 40/108  soft-tissue]
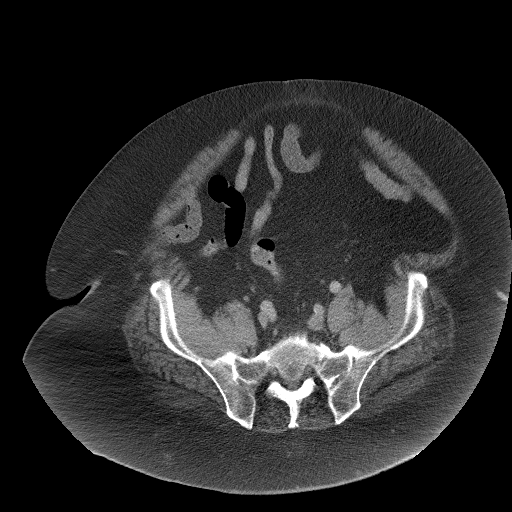
[im 46/108  soft-tissue]
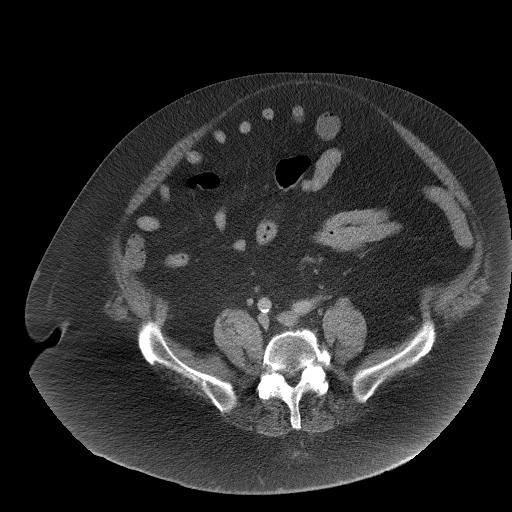
[im 57/108  soft-tissue]
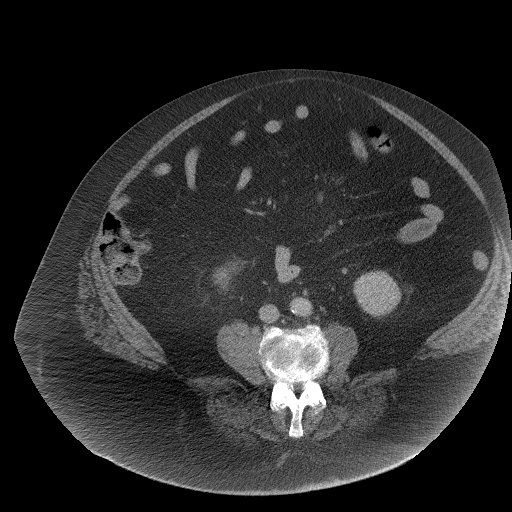
[im 62/108  soft-tissue]
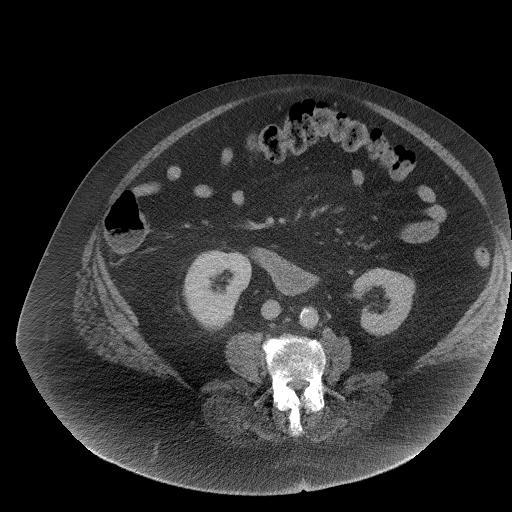
[im 68/108  soft-tissue]
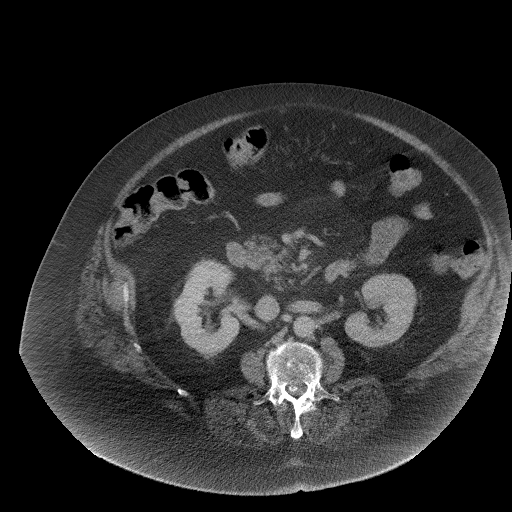
[im 68/108  bone]
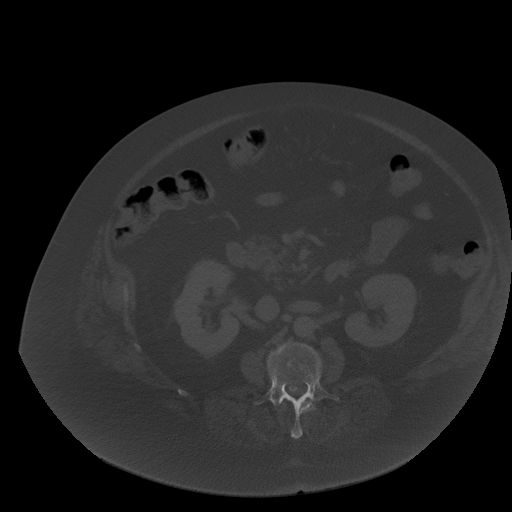
[im 79/108  soft-tissue]
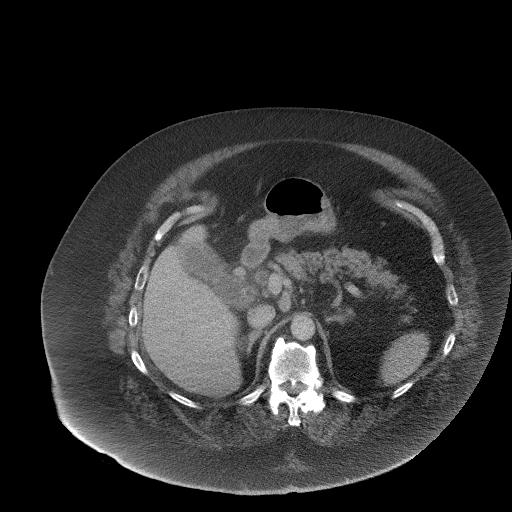
[im 85/108  soft-tissue]
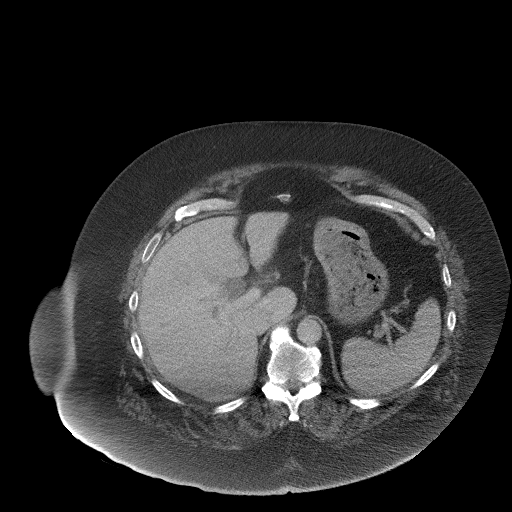
[im 91/108  soft-tissue]
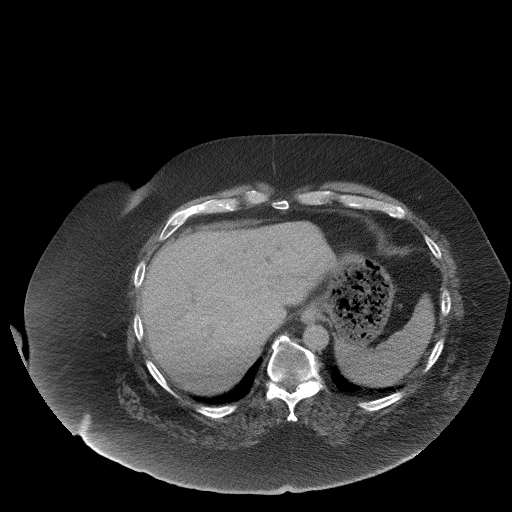
[im 102/108  soft-tissue]
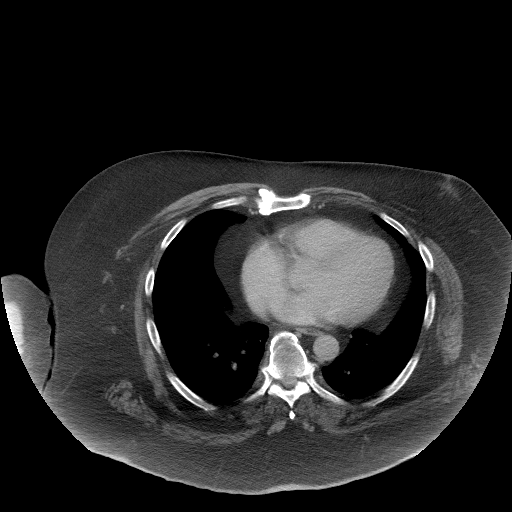

[Series 6: a/p w/ cor · coronal · 1.05mm/px · 3 of 223 slices shown]
[im 75/223  soft-tissue]
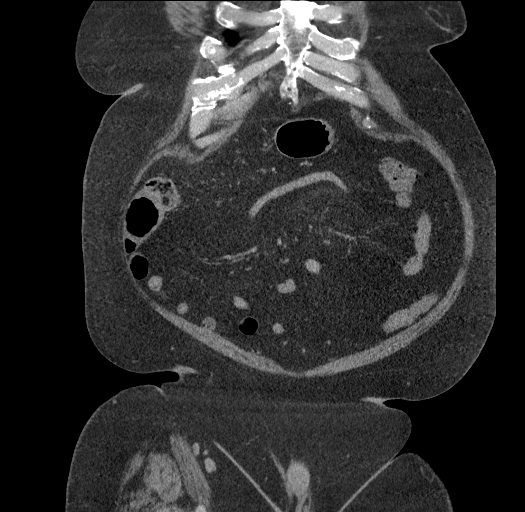
[im 99/223  soft-tissue]
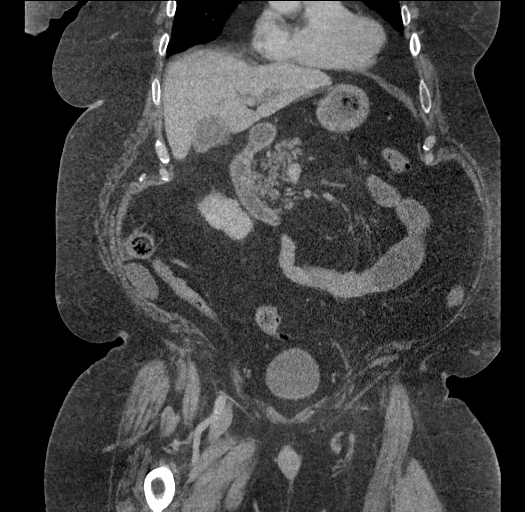
[im 124/223  soft-tissue]
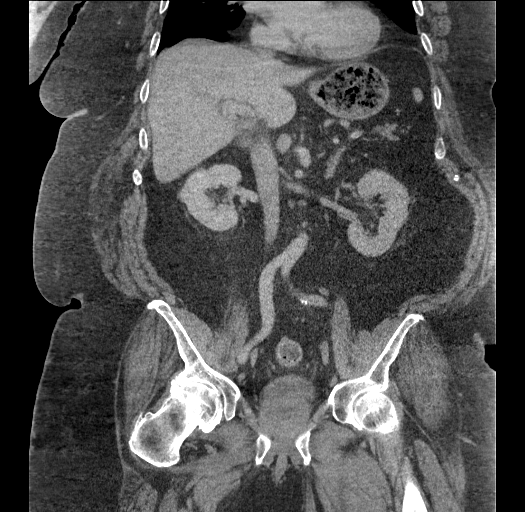

[16 of 46 positions shown; findings below may reference images not displayed]

FINDINGS: Lower chest: No consolidation or pleural fluid.

Hepatobiliary: Recurrent intra and extrahepatic biliary ductal
dilatation with common bile duct measuring 11 mm. Gallbladder
physiologically distended with wall thickening and mild
pericholecystic stranding. No calcified choledocholithiasis. No
focal hepatic lesion.

Pancreas: No ductal dilatation or inflammation. The previous
questioned hypodense mass on CT in the pancreatic head is not seen.

Spleen: Normal in size without focal abnormality.

Adrenals/Urinary Tract: Normal adrenal glands. No hydronephrosis or
perinephric edema. Homogeneous renal enhancement with symmetric
excretion on delayed phase imaging. Urinary bladder is
physiologically distended without wall thickening.

Stomach/Bowel: Stomach is within normal limits. Appendix appears
normal. No evidence of bowel wall thickening, distention, or
inflammatory changes.

Vascular/Lymphatic: Small left central mesenteric nodes with
adjacent mesenteric edema, unchanged from prior exam. Aortic
atherosclerosis without aneurysm. An 11 mm portal caval node is
unchanged from prior and likely reactive.

Reproductive: Enlarged prostate gland spanning 6.8 cm transverse.

Other: No free air or ascites. Fat containing umbilical hernia with
diastasis of the anterior abdominal wall musculature.

Musculoskeletal: Degenerative change in both hips and throughout the
lumbar spine. There are no acute or suspicious osseous
abnormalities.
IMPRESSION: 1. Recurrent intra and extrahepatic biliary ductal dilatation,
common bile duct measures 11 mm. Mild gallbladder wall thickening
with pericholecystic edema suggests acute gallbladder inflammation,
no abnormal gallbladder distention. MRCP or ERCP may be of value.
2. No other acute finding in the abdomen or pelvis.
3. Incidental findings of prostatomegaly and Aortic Atherosclerosis
([id number]).

## 2021-04-20 ENCOUNTER — Encounter: Payer: Self-pay | Admitting: Gastroenterology

## 2022-02-02 DIAGNOSIS — Z792 Long term (current) use of antibiotics: Secondary | ICD-10-CM

## 2022-02-02 DIAGNOSIS — M545 Low back pain, unspecified: Secondary | ICD-10-CM

## 2022-02-02 DIAGNOSIS — Z7689 Persons encountering health services in other specified circumstances: Secondary | ICD-10-CM

## 2022-02-02 DIAGNOSIS — D649 Anemia, unspecified: Secondary | ICD-10-CM

## 2022-02-02 DIAGNOSIS — Z959 Presence of cardiac and vascular implant and graft, unspecified: Secondary | ICD-10-CM

## 2022-02-02 DIAGNOSIS — E876 Hypokalemia: Secondary | ICD-10-CM

## 2022-02-02 DIAGNOSIS — N181 Chronic kidney disease, stage 1: Secondary | ICD-10-CM

## 2022-02-10 DIAGNOSIS — N181 Chronic kidney disease, stage 1: Secondary | ICD-10-CM

## 2022-03-04 DIAGNOSIS — R2689 Other abnormalities of gait and mobility: Secondary | ICD-10-CM | POA: Diagnosis not present

## 2022-03-04 DIAGNOSIS — N390 Urinary tract infection, site not specified: Secondary | ICD-10-CM | POA: Diagnosis not present

## 2022-03-04 DIAGNOSIS — M6281 Muscle weakness (generalized): Secondary | ICD-10-CM | POA: Diagnosis not present

## 2022-06-09 DEATH — deceased
